# Patient Record
Sex: Male | Born: 1960
Health system: Southern US, Community
[De-identification: ages and names within clinical notes are randomized; demographics above are authoritative.]

## PROBLEM LIST (undated history)

## (undated) DIAGNOSIS — R569 Unspecified convulsions: Secondary | ICD-10-CM

## (undated) DIAGNOSIS — S065XAA Traumatic subdural hemorrhage with loss of consciousness status unknown, initial encounter: Secondary | ICD-10-CM

## (undated) DIAGNOSIS — F32A Depression, unspecified: Secondary | ICD-10-CM

## (undated) DIAGNOSIS — I10 Essential (primary) hypertension: Secondary | ICD-10-CM

## (undated) DIAGNOSIS — K219 Gastro-esophageal reflux disease without esophagitis: Secondary | ICD-10-CM

## (undated) DIAGNOSIS — F1910 Other psychoactive substance abuse, uncomplicated: Secondary | ICD-10-CM

## (undated) HISTORY — PX: NO PAST SURGERIES: SHX2092

---

## 2001-08-16 ENCOUNTER — Other Ambulatory Visit: Admission: RE | Admit: 2001-08-16 | Discharge: 2001-08-16 | Payer: Self-pay | Admitting: Urology

## 2002-09-19 ENCOUNTER — Emergency Department (HOSPITAL_COMMUNITY): Admission: EM | Admit: 2002-09-19 | Discharge: 2002-09-19 | Payer: Self-pay | Admitting: *Deleted

## 2002-09-19 ENCOUNTER — Encounter: Payer: Self-pay | Admitting: *Deleted

## 2003-01-10 ENCOUNTER — Ambulatory Visit (HOSPITAL_COMMUNITY): Admission: RE | Admit: 2003-01-10 | Discharge: 2003-01-10 | Payer: Self-pay | Admitting: Family Medicine

## 2003-01-10 ENCOUNTER — Encounter: Payer: Self-pay | Admitting: Family Medicine

## 2003-09-12 ENCOUNTER — Ambulatory Visit (HOSPITAL_COMMUNITY): Admission: RE | Admit: 2003-09-12 | Discharge: 2003-09-12 | Payer: Self-pay | Admitting: Family Medicine

## 2008-07-18 ENCOUNTER — Ambulatory Visit (HOSPITAL_COMMUNITY): Admission: RE | Admit: 2008-07-18 | Discharge: 2008-07-18 | Payer: Self-pay | Admitting: Family Medicine

## 2014-03-07 ENCOUNTER — Other Ambulatory Visit: Payer: Self-pay | Admitting: Urology

## 2014-03-07 ENCOUNTER — Ambulatory Visit (INDEPENDENT_AMBULATORY_CARE_PROVIDER_SITE_OTHER): Payer: 59 | Admitting: Urology

## 2014-03-07 DIAGNOSIS — R972 Elevated prostate specific antigen [PSA]: Secondary | ICD-10-CM

## 2014-04-04 ENCOUNTER — Other Ambulatory Visit (HOSPITAL_COMMUNITY): Payer: 59

## 2019-07-22 DIAGNOSIS — I10 Essential (primary) hypertension: Secondary | ICD-10-CM | POA: Diagnosis not present

## 2019-07-22 DIAGNOSIS — Z1389 Encounter for screening for other disorder: Secondary | ICD-10-CM | POA: Diagnosis not present

## 2019-07-22 DIAGNOSIS — G4701 Insomnia due to medical condition: Secondary | ICD-10-CM | POA: Diagnosis not present

## 2019-07-22 DIAGNOSIS — Z6823 Body mass index (BMI) 23.0-23.9, adult: Secondary | ICD-10-CM | POA: Diagnosis not present

## 2019-12-06 ENCOUNTER — Encounter (HOSPITAL_COMMUNITY): Payer: Self-pay | Admitting: *Deleted

## 2019-12-06 ENCOUNTER — Emergency Department (HOSPITAL_COMMUNITY)
Admission: EM | Admit: 2019-12-06 | Discharge: 2019-12-06 | Disposition: A | Discharge status: Home / Self Care | Payer: BC Managed Care – PPO | Attending: Emergency Medicine | Admitting: Emergency Medicine

## 2019-12-06 ENCOUNTER — Other Ambulatory Visit: Payer: Self-pay

## 2019-12-06 DIAGNOSIS — B349 Viral infection, unspecified: Secondary | ICD-10-CM | POA: Diagnosis not present

## 2019-12-06 DIAGNOSIS — I10 Essential (primary) hypertension: Secondary | ICD-10-CM | POA: Diagnosis not present

## 2019-12-06 DIAGNOSIS — F17210 Nicotine dependence, cigarettes, uncomplicated: Secondary | ICD-10-CM | POA: Diagnosis not present

## 2019-12-06 DIAGNOSIS — S00502A Unspecified superficial injury of oral cavity, initial encounter: Secondary | ICD-10-CM | POA: Diagnosis not present

## 2019-12-06 DIAGNOSIS — W57XXXA Bitten or stung by nonvenomous insect and other nonvenomous arthropods, initial encounter: Secondary | ICD-10-CM | POA: Insufficient documentation

## 2019-12-06 DIAGNOSIS — Y998 Other external cause status: Secondary | ICD-10-CM | POA: Insufficient documentation

## 2019-12-06 DIAGNOSIS — L2481 Irritant contact dermatitis due to metals: Secondary | ICD-10-CM

## 2019-12-06 DIAGNOSIS — L2489 Irritant contact dermatitis due to other agents: Secondary | ICD-10-CM | POA: Diagnosis not present

## 2019-12-06 DIAGNOSIS — Y9389 Activity, other specified: Secondary | ICD-10-CM | POA: Insufficient documentation

## 2019-12-06 DIAGNOSIS — Y9289 Other specified places as the place of occurrence of the external cause: Secondary | ICD-10-CM | POA: Insufficient documentation

## 2019-12-06 HISTORY — DX: Essential (primary) hypertension: I10

## 2019-12-06 LAB — COMPREHENSIVE METABOLIC PANEL
ALT: 47 U/L — ABNORMAL HIGH (ref 0–44)
AST: 68 U/L — ABNORMAL HIGH (ref 15–41)
Albumin: 4.4 g/dL (ref 3.5–5.0)
Alkaline Phosphatase: 60 U/L (ref 38–126)
Anion gap: 13 (ref 5–15)
BUN: 6 mg/dL (ref 6–20)
CO2: 25 mmol/L (ref 22–32)
Calcium: 8.7 mg/dL — ABNORMAL LOW (ref 8.9–10.3)
Chloride: 90 mmol/L — ABNORMAL LOW (ref 98–111)
Creatinine, Ser: 0.69 mg/dL (ref 0.61–1.24)
GFR calc Af Amer: >60 mL/min (ref >60)
GFR calc non Af Amer: >60 mL/min (ref >60)
Glucose, Bld: 92 mg/dL (ref 70–99)
Potassium: 3.4 mmol/L — ABNORMAL LOW (ref 3.5–5.1)
Sodium: 128 mmol/L — ABNORMAL LOW (ref 135–145)
Total Bilirubin: 0.7 mg/dL (ref 0.3–1.2)
Total Protein: 7.5 g/dL (ref 6.5–8.1)

## 2019-12-06 LAB — CBC WITH DIFFERENTIAL/PLATELET
Abs Immature Granulocytes: 0.01 10*3/uL (ref 0.00–0.07)
Basophils Absolute: 0 10*3/uL (ref 0.0–0.1)
Basophils Relative: 1 %
Eosinophils Absolute: 0.1 10*3/uL (ref 0.0–0.5)
Eosinophils Relative: 3 %
HCT: 39.1 % (ref 39.0–52.0)
Hemoglobin: 13.6 g/dL (ref 13.0–17.0)
Immature Granulocytes: 0 %
Lymphocytes Relative: 33 %
Lymphs Abs: 1.1 10*3/uL (ref 0.7–4.0)
MCH: 34.2 pg — ABNORMAL HIGH (ref 26.0–34.0)
MCHC: 34.8 g/dL (ref 30.0–36.0)
MCV: 98.2 fL (ref 80.0–100.0)
Monocytes Absolute: 0.5 10*3/uL (ref 0.1–1.0)
Monocytes Relative: 14 %
Neutro Abs: 1.7 10*3/uL (ref 1.7–7.7)
Neutrophils Relative %: 49 %
Platelets: 179 10*3/uL (ref 150–400)
RBC: 3.98 MIL/uL — ABNORMAL LOW (ref 4.22–5.81)
RDW: 12.5 % (ref 11.5–15.5)
WBC: 3.4 10*3/uL — ABNORMAL LOW (ref 4.0–10.5)
nRBC: 0 % (ref 0.0–0.2)

## 2019-12-06 LAB — LIPASE, BLOOD: Lipase: 38 U/L (ref 11–51)

## 2019-12-06 MED ORDER — ONDANSETRON 4 MG PO TBDP
4.0000 mg | ORAL_TABLET | Freq: Three times a day (TID) | ORAL | 0 refills | Status: DC | PRN
Start: 2019-12-06 — End: 2021-01-10

## 2019-12-06 MED ORDER — ONDANSETRON 4 MG PO TBDP
4.0000 mg | ORAL_TABLET | Freq: Once | ORAL | Status: AC
  Administered 2019-12-06: 4 mg via ORAL
  Filled 2019-12-06: qty 1

## 2019-12-06 NOTE — ED Provider Notes (Signed)
[facility] EMERGENCY DEPARTMENT Provider Note   CSN: [id number] Arrival date & time: 12/06/19  1653     History Chief Complaint  Patient presents with  . Mouth Injury    ROEL DOUTHAT is a 59 y.o. male presenting for evaluation of generalized malaise and fatigue along with vomiting and diarrhea.  He was bit on his tongue by a small spider yesterday morning and several hours later developed generalized fatigue and generally feeling unwell.  He states he took a sip of a drink with a straw and apparently there was a small spider inside the straw which bit him on the tongue.  He denies current pain or swelling of his tongue but still feels a small knot at the bite site.  He removed the insect from his mouth, describes it as tiny and brown in coloration.  He worked last night but left early secondary to generalized fatigue.  This morning he had 3 episodes of emesis with persistent nausea throughout today.  He also had one episode of diarrhea earlier today but none since.  He denies fevers or chills, no cough, no abdominal pain or cramping.  Also no dysuria, headache, neck pain, no rash development.  He mentions a chronic rash of his mid lower abdomen since purchasing a new belt several months ago.  He had been applying Neosporin but the rash worsened so his PCP prescribed mupirocin but still with no relief at the site.  Patient is fully vaccinated for COVID-19.  The history is provided by the patient.       Past Medical History:  Diagnosis Date  . Hypertension     There are no problems to display for this patient.   History reviewed. No pertinent surgical history.     No family history on file.  Social History   Tobacco Use  . Smoking status: Current Every Day Smoker    Packs/day: 0.50  . Smokeless tobacco: Never Used  Vaping Use  . Vaping Use: Never used  Substance Use Topics  . Alcohol use: Yes  . Drug use: Not Currently    Home Medications Prior to Admission medications    Medication Sig Start Date End Date Taking? Authorizing Provider  ondansetron (ZOFRAN ODT) 4 MG disintegrating tablet Take 1 tablet (4 mg total) by mouth every 8 (eight) hours as needed for nausea or vomiting. 12/06/19   Burgess Amor, PA-C    Allergies    Codeine  Review of Systems   Review of Systems  Constitutional: Positive for fatigue. Negative for chills, diaphoresis and fever.  HENT: Negative for congestion, facial swelling, rhinorrhea, sore throat, trouble swallowing and voice change.   Eyes: Negative.   Respiratory: Negative for chest tightness and shortness of breath.   Cardiovascular: Negative for chest pain.  Gastrointestinal: Positive for diarrhea, nausea and vomiting. Negative for abdominal pain.  Genitourinary: Negative.   Musculoskeletal: Negative for arthralgias, joint swelling and neck pain.  Skin: Negative.  Negative for rash and wound.       Localized abdominal rash per HPI  Neurological: Negative for dizziness, weakness, light-headedness, numbness and headaches.  Psychiatric/Behavioral: Negative.     Physical Exam Updated Vital Signs BP (!) 127/93   Pulse 98   Temp 98.3 F (36.8 C)   Resp 20   SpO2 97%   Physical Exam Vitals and nursing note reviewed.  Constitutional:      Appearance: He is well-developed.  HENT:     Head: Normocephalic and atraumatic.  Nose:     Comments: No obvious tongue lesion or bite identified.    Mouth/Throat:     Mouth: Mucous membranes are moist.     Pharynx: No posterior oropharyngeal erythema.  Eyes:     Conjunctiva/sclera: Conjunctivae normal.  Cardiovascular:     Rate and Rhythm: Normal rate and regular rhythm.     Pulses: Normal pulses.     Heart sounds: Normal heart sounds.  Pulmonary:     Effort: Pulmonary effort is normal.     Breath sounds: Normal breath sounds. No wheezing.  Abdominal:     General: Bowel sounds are normal.     Palpations: Abdomen is soft.     Tenderness: There is no abdominal tenderness.    Musculoskeletal:        General: Normal range of motion.     Cervical back: Normal range of motion.  Skin:    General: Skin is warm and dry.     Comments: Localized erythematous macular rash localized to anterior midline waistline.  Neurological:     Mental Status: He is alert.     ED Results / Procedures / Treatments   Labs (all labs ordered are listed, but only abnormal results are displayed) Labs Reviewed  COMPREHENSIVE METABOLIC PANEL - Abnormal; Notable for the following components:      Result Value   Sodium 128 (*)    Potassium 3.4 (*)    Chloride 90 (*)    Calcium 8.7 (*)    AST 68 (*)    ALT 47 (*)    All other components within normal limits  CBC WITH DIFFERENTIAL/PLATELET - Abnormal; Notable for the following components:   WBC 3.4 (*)    RBC 3.98 (*)    MCH 34.2 (*)    All other components within normal limits  LIPASE, BLOOD    EKG None  Radiology No results found.  Procedures Procedures (including critical care time)  Medications Ordered in ED Medications  ondansetron (ZOFRAN-ODT) disintegrating tablet 4 mg (4 mg Oral Given 12/06/19 1829)    ED Course  I have reviewed the triage vital signs and the nursing notes.  Pertinent labs & imaging results that were available during my care of the patient were reviewed by me and considered in my medical decision making (see chart for details).    MDM Rules/Calculators/A&P                          Patient with suspected viral syndrome.  He does not have symptoms that would correspond with a brown recluse spider bite.  I suspect this is viral and should run its course.  He was prescribed Zofran for nausea relief.  He was advised rest, Tylenol or Motrin for body aches.  We also discussed the rash on his abdomen, I suspect this is a contact dermatitis from his new belt buckle.  I suggested he try hydrocortisone cream in place of the mupirocin he has been using.  As needed follow-up if symptoms persist or  worsen. Final Clinical Impression(s) / ED Diagnoses Final diagnoses:  Viral syndrome  Irritant contact dermatitis due to metals    Rx / DC Orders ED Discharge Orders         Ordered    ondansetron (ZOFRAN ODT) 4 MG disintegrating tablet  Every 8 hours PRN     Discontinue  Reprint     12/06/19 1933  Burgess Amor, PA-C 12/06/19 1934    Derwood Kaplan, MD 12/06/19 [phone]

## 2019-12-06 NOTE — ED Triage Notes (Signed)
States a spider bit his tongue yesterday

## 2019-12-06 NOTE — Discharge Instructions (Signed)
Your lab work today is reassuring with no red flag findings suggesting your symptoms are related to this spider bite.  I suspect you have a virus that should resolve with time and rest.  You may continue using the Zofran as prescribed if needed for continued nausea.  Make sure you are drinking plenty of fluids.  Get rechecked if your symptoms are not improving over the next 48 hours.  I suggest a trial of hydrocortisone cream (no prescription needed) for your abdominal rash.  I suspect that this is an allergic reaction to your belt buckle.

## 2019-12-06 NOTE — ED Triage Notes (Signed)
States he feels tired and had to leave work.

## 2020-01-30 ENCOUNTER — Other Ambulatory Visit: Payer: BC Managed Care – PPO

## 2020-12-11 ENCOUNTER — Encounter (HOSPITAL_COMMUNITY): Payer: Self-pay | Admitting: Emergency Medicine

## 2020-12-11 ENCOUNTER — Emergency Department (HOSPITAL_COMMUNITY)
Admission: EM | Admit: 2020-12-11 | Discharge: 2020-12-11 | Disposition: A | Discharge status: Home / Self Care | Payer: 59 | Attending: Emergency Medicine | Admitting: Emergency Medicine

## 2020-12-11 ENCOUNTER — Other Ambulatory Visit: Payer: Self-pay

## 2020-12-11 ENCOUNTER — Emergency Department (HOSPITAL_COMMUNITY): Payer: 59

## 2020-12-11 DIAGNOSIS — Z20822 Contact with and (suspected) exposure to covid-19: Secondary | ICD-10-CM | POA: Insufficient documentation

## 2020-12-11 DIAGNOSIS — E871 Hypo-osmolality and hyponatremia: Secondary | ICD-10-CM | POA: Insufficient documentation

## 2020-12-11 DIAGNOSIS — R630 Anorexia: Secondary | ICD-10-CM | POA: Diagnosis not present

## 2020-12-11 DIAGNOSIS — Z79899 Other long term (current) drug therapy: Secondary | ICD-10-CM | POA: Diagnosis not present

## 2020-12-11 DIAGNOSIS — I10 Essential (primary) hypertension: Secondary | ICD-10-CM | POA: Diagnosis not present

## 2020-12-11 DIAGNOSIS — F17210 Nicotine dependence, cigarettes, uncomplicated: Secondary | ICD-10-CM | POA: Insufficient documentation

## 2020-12-11 DIAGNOSIS — B349 Viral infection, unspecified: Secondary | ICD-10-CM | POA: Diagnosis not present

## 2020-12-11 DIAGNOSIS — R197 Diarrhea, unspecified: Secondary | ICD-10-CM | POA: Diagnosis present

## 2020-12-11 LAB — URINALYSIS, ROUTINE W REFLEX MICROSCOPIC
Bilirubin Urine: NEGATIVE
Glucose, UA: NEGATIVE mg/dL
Hgb urine dipstick: NEGATIVE
Ketones, ur: NEGATIVE mg/dL
Leukocytes,Ua: NEGATIVE
Nitrite: NEGATIVE
Protein, ur: NEGATIVE mg/dL
Specific Gravity, Urine: 1.001 — ABNORMAL LOW (ref 1.005–1.030)
pH: 6 (ref 5.0–8.0)

## 2020-12-11 LAB — CBC WITH DIFFERENTIAL/PLATELET
Abs Immature Granulocytes: 0.05 10*3/uL (ref 0.00–0.07)
Basophils Absolute: 0 10*3/uL (ref 0.0–0.1)
Basophils Relative: 0 %
Eosinophils Absolute: 0 10*3/uL (ref 0.0–0.5)
Eosinophils Relative: 0 %
HCT: 44.5 % (ref 39.0–52.0)
Hemoglobin: 15.7 g/dL (ref 13.0–17.0)
Immature Granulocytes: 1 %
Lymphocytes Relative: 13 %
Lymphs Abs: 1 10*3/uL (ref 0.7–4.0)
MCH: 32.7 pg (ref 26.0–34.0)
MCHC: 35.3 g/dL (ref 30.0–36.0)
MCV: 92.7 fL (ref 80.0–100.0)
Monocytes Absolute: 0.9 10*3/uL (ref 0.1–1.0)
Monocytes Relative: 11 %
Neutro Abs: 5.9 10*3/uL (ref 1.7–7.7)
Neutrophils Relative %: 75 %
Platelets: 253 10*3/uL (ref 150–400)
RBC: 4.8 MIL/uL (ref 4.22–5.81)
RDW: 12.5 % (ref 11.5–15.5)
WBC: 7.8 10*3/uL (ref 4.0–10.5)
nRBC: 0 % (ref 0.0–0.2)

## 2020-12-11 LAB — COMPREHENSIVE METABOLIC PANEL
ALT: 49 U/L — ABNORMAL HIGH (ref 0–44)
AST: 73 U/L — ABNORMAL HIGH (ref 15–41)
Albumin: 4.7 g/dL (ref 3.5–5.0)
Alkaline Phosphatase: 56 U/L (ref 38–126)
Anion gap: 15 (ref 5–15)
BUN: 9 mg/dL (ref 6–20)
CO2: 28 mmol/L (ref 22–32)
Calcium: 9.6 mg/dL (ref 8.9–10.3)
Chloride: 85 mmol/L — ABNORMAL LOW (ref 98–111)
Creatinine, Ser: 1.03 mg/dL (ref 0.61–1.24)
GFR, Estimated: >60 mL/min (ref >60)
Glucose, Bld: 114 mg/dL — ABNORMAL HIGH (ref 70–99)
Potassium: 3 mmol/L — ABNORMAL LOW (ref 3.5–5.1)
Sodium: 128 mmol/L — ABNORMAL LOW (ref 135–145)
Total Bilirubin: 1.1 mg/dL (ref 0.3–1.2)
Total Protein: 7.7 g/dL (ref 6.5–8.1)

## 2020-12-11 LAB — CK: Total CK: 332 U/L (ref 49–397)

## 2020-12-11 LAB — RESP PANEL BY RT-PCR (FLU A&B, COVID) ARPGX2
Influenza A by PCR: NEGATIVE
Influenza B by PCR: NEGATIVE
SARS Coronavirus 2 by RT PCR: NEGATIVE

## 2020-12-11 MED ORDER — SODIUM CHLORIDE 0.9 % IV SOLN: INTRAVENOUS | Status: DC

## 2020-12-11 MED ORDER — SODIUM CHLORIDE 0.9 % IV BOLUS
500.0000 mL | Freq: Once | INTRAVENOUS | Status: AC
  Administered 2020-12-11: 500 mL via INTRAVENOUS

## 2020-12-11 NOTE — Discharge Instructions (Addendum)
COVID testing influenza testing negative.  Sodium was low today and it was low a year ago.  This could be due to your blood pressure medicines I understand you do not know exactly what you are on but what we do have listed is amlodipine BenzePrO well that possibly could be the cause.  Follow back up with your primary care doctor.  Work note provided to be out of work.

## 2020-12-11 NOTE — ED Provider Notes (Signed)
[facility] EMERGENCY DEPARTMENT Provider Note   CSN: [id number] Arrival date & time: 12/11/20  1120     History Chief Complaint  Patient presents with   Heat Exposure    Eric Conley is a 60 y.o. male.  Patient with questionable heat exposure at work.  But patient sent in for work also to rule out COVID.  Patient's had some diarrhea and leg Fatigue Some Nausea but No Vomiting No Real Abdominal Pain.  Temperature Here 98.4 Heart Rate 122.  Patient Has a History of Hypertension.  Patient's heart rate came down on its own.  Now 88.  Denies any fevers.  But he has had fatigue and decreased appetite.  Patient states he has been drinking sports drinks and water at work and has done that for a long time.  Chart review shows that year ago he had a sodium of 128.  Patient has been vaccinated against COVID and had the booster.  Patient without any real respiratory symptoms.  The diarrhea has resolved.  No abdominal pain.      Past Medical History:  Diagnosis Date   Hypertension     There are no problems to display for this patient.   History reviewed. No pertinent surgical history.     No family history on file.  Social History   Tobacco Use   Smoking status: Every Day    Packs/day: 0.50    Pack years: 0.00    Types: Cigarettes   Smokeless tobacco: Never  Vaping Use   Vaping Use: Never used  Substance Use Topics   Alcohol use: Yes   Drug use: Not Currently    Home Medications Prior to Admission medications   Medication Sig Start Date End Date Taking? Authorizing Provider  ALPRAZolam Prudy Feeler) 1 MG tablet Take 1 mg by mouth 3 (three) times daily. 11/13/20  Yes [provider]  amLODipine-benazepril (LOTREL) 10-20 MG capsule Take 1 capsule by mouth daily. 11/13/20  Yes [provider]  betamethasone dipropionate 0.05 % cream Apply 1 application topically 2 (two) times daily. 10/23/20  Yes [provider]  mupirocin ointment (BACTROBAN) 2 % Apply 1  application topically 3 (three) times daily. 10/23/20  Yes [provider]  ondansetron (ZOFRAN ODT) 4 MG disintegrating tablet Take 1 tablet (4 mg total) by mouth every 8 (eight) hours as needed for nausea or vomiting. 12/06/19   Burgess Amor, PA-C    Allergies    Codeine  Review of Systems   Review of Systems  Constitutional:  Positive for fatigue. Negative for chills and fever.  HENT:  Negative for ear pain and sore throat.   Eyes:  Negative for pain and visual disturbance.  Respiratory:  Negative for cough and shortness of breath.   Cardiovascular:  Negative for chest pain and palpitations.  Gastrointestinal:  Positive for diarrhea and nausea. Negative for abdominal pain and vomiting.  Genitourinary:  Negative for dysuria and hematuria.  Musculoskeletal:  Negative for arthralgias and back pain.  Skin:  Negative for color change and rash.  Neurological:  Positive for weakness. Negative for seizures and syncope.  All other systems reviewed and are negative.  Physical Exam Updated Vital Signs BP 97/82   Pulse 80   Temp 98.4 F (36.9 C) (Oral)   Resp 18   Ht 1.778 m (5\' 10" )   Wt 68 kg   SpO2 98%   BMI 21.52 kg/m   Physical Exam Vitals and nursing note reviewed.  Constitutional:  Appearance: Normal appearance. He is well-developed.  HENT:     Head: Normocephalic and atraumatic.     Mouth/Throat:     Mouth: Mucous membranes are moist.  Eyes:     Extraocular Movements: Extraocular movements intact.     Conjunctiva/sclera: Conjunctivae normal.     Pupils: Pupils are equal, round, and reactive to light.  Cardiovascular:     Rate and Rhythm: Normal rate and regular rhythm.     Heart sounds: No murmur heard. Pulmonary:     Effort: Pulmonary effort is normal. No respiratory distress.     Breath sounds: Normal breath sounds. No wheezing.  Abdominal:     General: There is no distension.     Palpations: Abdomen is soft.     Tenderness: There is no abdominal  tenderness. There is no guarding.  Musculoskeletal:        General: No swelling. Normal range of motion.     Cervical back: Normal range of motion and neck supple.  Skin:    General: Skin is warm and dry.  Neurological:     General: No focal deficit present.     Mental Status: He is alert and oriented to person, place, and time.     Cranial Nerves: No cranial nerve deficit.     Sensory: No sensory deficit.     Motor: No weakness.    ED Results / Procedures / Treatments   Labs (all labs ordered are listed, but only abnormal results are displayed) Labs Reviewed  COMPREHENSIVE METABOLIC PANEL - Abnormal; Notable for the following components:      Result Value   Sodium 128 (*)    Potassium 3.0 (*)    Chloride 85 (*)    Glucose, Bld 114 (*)    AST 73 (*)    ALT 49 (*)    All other components within normal limits  URINALYSIS, ROUTINE W REFLEX MICROSCOPIC - Abnormal; Notable for the following components:   Color, Urine STRAW (*)    Specific Gravity, Urine 1.001 (*)    All other components within normal limits  RESP PANEL BY RT-PCR (FLU A&B, COVID) ARPGX2  CBC WITH DIFFERENTIAL/PLATELET  CK    EKG None  Radiology DG Chest Port 1 View  Result Date: 12/11/2020 CLINICAL DATA:  Fatigue EXAM: PORTABLE CHEST 1 VIEW COMPARISON:  None. FINDINGS: The heart size and mediastinal contours are within normal limits. Mildly coarsened bibasilar interstitial markings. No focal airspace consolidation, pleural effusion, or pneumothorax. The visualized skeletal structures are unremarkable. IMPRESSION: Mildly coarsened bibasilar interstitial markings, which may reflect bronchitic type lung changes. Electronically Signed   By: Duanne Guess D.O.   On: 12/11/2020 13:20    Procedures Procedures   Medications Ordered in ED Medications  0.9 %  sodium chloride infusion ( Intravenous New Bag/Given 12/11/20 1255)  sodium chloride 0.9 % bolus 500 mL (500 mLs Intravenous New Bag/Given 12/11/20 1430)     ED Course  I have reviewed the triage vital signs and the nursing notes.  Pertinent labs & imaging results that were available during my care of the patient were reviewed by me and considered in my medical decision making (see chart for details).    MDM Rules/Calculators/A&P                          Patient CK slightly elevated and then 300 range.  But not significant for rhabdomyolysis.  Patient's urinalysis is negative.  COVID testing influenza testing  is negative.  CBC no leukocytosis hemoglobin 15.7.  Electrolytes significant for sodium of 128.  Renal functions normal.  Slight elevation in AST ALT.  Chest x-ray negative.  Patient was given some normal saline here 500 cc bolus.  Patient is on blood pressure medicine.  That may be responsible for the low sodiums.  We will have him follow back up with primary care doctor.  We will give him a work note to be out of work throughout the weekend.  No acute findings.  Patient will return for any new or worse symptoms   Final Clinical Impression(s) / ED Diagnoses Final diagnoses:  Viral illness  Hyponatremia    Rx / DC Orders ED Discharge Orders     None        Vanetta Mulders, MD 12/11/20 1459

## 2020-12-11 NOTE — ED Triage Notes (Signed)
Pt c/o diarrhea, leg cramps, and fatigue. 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

## 2021-01-10 ENCOUNTER — Encounter (HOSPITAL_COMMUNITY): Payer: Self-pay

## 2021-01-10 ENCOUNTER — Emergency Department (HOSPITAL_COMMUNITY)
Admission: EM | Admit: 2021-01-10 | Discharge: 2021-01-12 | Disposition: A | Discharge status: Home / Self Care | Payer: 59 | Attending: Emergency Medicine | Admitting: Emergency Medicine

## 2021-01-10 ENCOUNTER — Other Ambulatory Visit: Payer: Self-pay

## 2021-01-10 DIAGNOSIS — I10 Essential (primary) hypertension: Secondary | ICD-10-CM | POA: Diagnosis not present

## 2021-01-10 DIAGNOSIS — F1020 Alcohol dependence, uncomplicated: Secondary | ICD-10-CM | POA: Diagnosis not present

## 2021-01-10 DIAGNOSIS — R45851 Suicidal ideations: Secondary | ICD-10-CM

## 2021-01-10 DIAGNOSIS — F17210 Nicotine dependence, cigarettes, uncomplicated: Secondary | ICD-10-CM | POA: Insufficient documentation

## 2021-01-10 DIAGNOSIS — Z79899 Other long term (current) drug therapy: Secondary | ICD-10-CM | POA: Diagnosis not present

## 2021-01-10 DIAGNOSIS — Z20822 Contact with and (suspected) exposure to covid-19: Secondary | ICD-10-CM | POA: Diagnosis not present

## 2021-01-10 DIAGNOSIS — F332 Major depressive disorder, recurrent severe without psychotic features: Secondary | ICD-10-CM | POA: Diagnosis not present

## 2021-01-10 DIAGNOSIS — F329 Major depressive disorder, single episode, unspecified: Secondary | ICD-10-CM | POA: Diagnosis present

## 2021-01-10 DIAGNOSIS — Y906 Blood alcohol level of 120-199 mg/100 ml: Secondary | ICD-10-CM | POA: Insufficient documentation

## 2021-01-10 LAB — CBC WITH DIFFERENTIAL/PLATELET
Abs Immature Granulocytes: 0.02 10*3/uL (ref 0.00–0.07)
Basophils Absolute: 0 10*3/uL (ref 0.0–0.1)
Basophils Relative: 1 %
Eosinophils Absolute: 0.1 10*3/uL (ref 0.0–0.5)
Eosinophils Relative: 4 %
HCT: 40.7 % (ref 39.0–52.0)
Hemoglobin: 14 g/dL (ref 13.0–17.0)
Immature Granulocytes: 1 %
Lymphocytes Relative: 31 %
Lymphs Abs: 1 10*3/uL (ref 0.7–4.0)
MCH: 33.3 pg (ref 26.0–34.0)
MCHC: 34.4 g/dL (ref 30.0–36.0)
MCV: 96.7 fL (ref 80.0–100.0)
Monocytes Absolute: 0.4 10*3/uL (ref 0.1–1.0)
Monocytes Relative: 14 %
Neutro Abs: 1.6 10*3/uL — ABNORMAL LOW (ref 1.7–7.7)
Neutrophils Relative %: 49 %
Platelets: 228 10*3/uL (ref 150–400)
RBC: 4.21 MIL/uL — ABNORMAL LOW (ref 4.22–5.81)
RDW: 13.2 % (ref 11.5–15.5)
WBC: 3.3 10*3/uL — ABNORMAL LOW (ref 4.0–10.5)
nRBC: 0 % (ref 0.0–0.2)

## 2021-01-10 LAB — RAPID URINE DRUG SCREEN, HOSP PERFORMED
Amphetamines: NOT DETECTED
Barbiturates: NOT DETECTED
Benzodiazepines: POSITIVE — AB
Cocaine: NOT DETECTED
Opiates: NOT DETECTED
Tetrahydrocannabinol: NOT DETECTED

## 2021-01-10 LAB — COMPREHENSIVE METABOLIC PANEL
ALT: 25 U/L (ref 0–44)
AST: 46 U/L — ABNORMAL HIGH (ref 15–41)
Albumin: 4.3 g/dL (ref 3.5–5.0)
Alkaline Phosphatase: 51 U/L (ref 38–126)
Anion gap: 10 (ref 5–15)
BUN: 8 mg/dL (ref 6–20)
CO2: 23 mmol/L (ref 22–32)
Calcium: 8.4 mg/dL — ABNORMAL LOW (ref 8.9–10.3)
Chloride: 102 mmol/L (ref 98–111)
Creatinine, Ser: 0.64 mg/dL (ref 0.61–1.24)
GFR, Estimated: >60 mL/min (ref >60)
Glucose, Bld: 104 mg/dL — ABNORMAL HIGH (ref 70–99)
Potassium: 3.4 mmol/L — ABNORMAL LOW (ref 3.5–5.1)
Sodium: 135 mmol/L (ref 135–145)
Total Bilirubin: 0.4 mg/dL (ref 0.3–1.2)
Total Protein: 7.5 g/dL (ref 6.5–8.1)

## 2021-01-10 LAB — ETHANOL: Alcohol, Ethyl (B): 167 mg/dL — ABNORMAL HIGH (ref <10)

## 2021-01-10 MED ORDER — IBUPROFEN 400 MG PO TABS: 600.0000 mg | ORAL_TABLET | Freq: Three times a day (TID) | ORAL | Status: DC | PRN

## 2021-01-10 MED ORDER — LORAZEPAM 1 MG PO TABS: 0.0000 mg | ORAL_TABLET | Freq: Two times a day (BID) | ORAL | Status: DC

## 2021-01-10 MED ORDER — THIAMINE HCL 100 MG/ML IJ SOLN
100.0000 mg | Freq: Every day | INTRAMUSCULAR | Status: DC
  Filled 2021-01-10: qty 2

## 2021-01-10 MED ORDER — AMLODIPINE BESYLATE 5 MG PO TABS
10.0000 mg | ORAL_TABLET | Freq: Every day | ORAL | Status: DC
  Administered 2021-01-10 – 2021-01-12 (×3): 10 mg via ORAL
  Filled 2021-01-10 (×3): qty 2

## 2021-01-10 MED ORDER — LORAZEPAM 2 MG/ML IJ SOLN: 0.0000 mg | Freq: Four times a day (QID) | INTRAMUSCULAR | Status: AC

## 2021-01-10 MED ORDER — ALPRAZOLAM 0.5 MG PO TABS: 1.0000 mg | ORAL_TABLET | Freq: Three times a day (TID) | ORAL | Status: DC

## 2021-01-10 MED ORDER — ONDANSETRON HCL 4 MG PO TABS: 4.0000 mg | ORAL_TABLET | Freq: Three times a day (TID) | ORAL | Status: DC | PRN

## 2021-01-10 MED ORDER — ALUM & MAG HYDROXIDE-SIMETH 200-200-20 MG/5ML PO SUSP
30.0000 mL | Freq: Four times a day (QID) | ORAL | Status: DC | PRN
  Administered 2021-01-11: 30 mL via ORAL
  Filled 2021-01-10 (×2): qty 30

## 2021-01-10 MED ORDER — LORAZEPAM 2 MG/ML IJ SOLN: 0.0000 mg | Freq: Two times a day (BID) | INTRAMUSCULAR | Status: DC

## 2021-01-10 MED ORDER — LORAZEPAM 1 MG PO TABS
0.0000 mg | ORAL_TABLET | Freq: Four times a day (QID) | ORAL | Status: AC
  Administered 2021-01-10: 1 mg via ORAL
  Administered 2021-01-11: 2 mg via ORAL
  Administered 2021-01-11 – 2021-01-12 (×3): 1 mg via ORAL
  Filled 2021-01-10 (×3): qty 1
  Filled 2021-01-10: qty 2
  Filled 2021-01-10: qty 1

## 2021-01-10 MED ORDER — THIAMINE HCL 100 MG PO TABS
100.0000 mg | ORAL_TABLET | Freq: Every day | ORAL | Status: DC
  Administered 2021-01-11 – 2021-01-12 (×2): 100 mg via ORAL
  Filled 2021-01-10 (×2): qty 1

## 2021-01-10 MED ORDER — HYDROXYZINE HCL 25 MG PO TABS
25.0000 mg | ORAL_TABLET | Freq: Once | ORAL | Status: AC
  Administered 2021-01-10: 25 mg via ORAL
  Filled 2021-01-10: qty 1

## 2021-01-10 MED ORDER — NICOTINE 21 MG/24HR TD PT24
21.0000 mg | MEDICATED_PATCH | Freq: Every day | TRANSDERMAL | Status: DC
  Administered 2021-01-10: 21 mg via TRANSDERMAL
  Filled 2021-01-10 (×2): qty 1

## 2021-01-10 MED ORDER — BENAZEPRIL HCL 10 MG PO TABS
20.0000 mg | ORAL_TABLET | Freq: Every day | ORAL | Status: DC
  Administered 2021-01-10 – 2021-01-12 (×3): 20 mg via ORAL
  Filled 2021-01-10 (×4): qty 2

## 2021-01-10 NOTE — ED Notes (Signed)
Dr Hyacinth Meeker with pt in family room

## 2021-01-10 NOTE — ED Provider Notes (Signed)
[facility] EMERGENCY DEPARTMENT Provider Note   CSN: [id number] Arrival date & time: 01/10/21  3825     History Chief Complaint  Patient presents with   V70.1    Eric Conley is a 60 y.o. male.  Per wife - states that he has been depressed for a while - lost his job when [employer] hit - since then has been "not right" - between jobs - he "doesn't want to work" - lost mother to Covid 06/12/19, father very sick with Covid.  Told her he doesn't want to live - he went to work this morning - states that he doesn't want to live anymore.  He is drinking "too much" - spouse can't tell me how much - but estimates 12 pack / day - mostly beer.  Pt tells me "I have nothing to live for", "I have served my purpose", "I have no reason to wake up" - states that he wants to take some sleeping pills and now wake up - "I have nothing to offer".   He is here on his own will - but states he "wants to die"  Takes Xanax at night.  And BP meds.  Had tried antidepressant in past but "made me shaky".        Past Medical History:  Diagnosis Date   Hypertension     There are no problems to display for this patient.   History reviewed. No pertinent surgical history.     No family history on file.  Social History   Tobacco Use   Smoking status: Every Day    Packs/day: 0.50    Types: Cigarettes   Smokeless tobacco: Never  Vaping Use   Vaping Use: Never used  Substance Use Topics   Alcohol use: Yes   Drug use: Not Currently    Home Medications Prior to Admission medications   Medication Sig Start Date End Date Taking? Authorizing Provider  ALPRAZolam Prudy Feeler) 1 MG tablet Take 1 mg by mouth 3 (three) times daily. 11/13/20   [provider]  amLODipine-benazepril (LOTREL) 10-20 MG capsule Take 1 capsule by mouth daily. 11/13/20   [provider]  betamethasone dipropionate 0.05 % cream Apply 1 application topically 2 (two) times daily. 10/23/20   [provider]  mupirocin  ointment (BACTROBAN) 2 % Apply 1 application topically 3 (three) times daily. 10/23/20   [provider]  ondansetron (ZOFRAN ODT) 4 MG disintegrating tablet Take 1 tablet (4 mg total) by mouth every 8 (eight) hours as needed for nausea or vomiting. 12/06/19   Burgess Amor, PA-C    Allergies    Codeine  Review of Systems   Review of Systems  All other systems reviewed and are negative.  Physical Exam Updated Vital Signs BP (!) 135/91 (BP Location: Right Arm)   Pulse 96   Temp 98 F (36.7 C) (Oral)   Resp 18   Ht 1.778 m (5\' 10" )   Wt 68 kg   SpO2 98%   BMI 21.52 kg/m   Physical Exam Vitals and nursing note reviewed.  Constitutional:      General: He is not in acute distress.    Appearance: He is well-developed.  HENT:     Head: Normocephalic and atraumatic.     Mouth/Throat:     Pharynx: No oropharyngeal exudate.  Eyes:     General: No scleral icterus.       Right eye: No discharge.        Left eye:  No discharge.     Conjunctiva/sclera: Conjunctivae normal.     Pupils: Pupils are equal, round, and reactive to light.  Neck:     Thyroid: No thyromegaly.     Vascular: No JVD.  Cardiovascular:     Rate and Rhythm: Normal rate and regular rhythm.     Heart sounds: Normal heart sounds. No murmur heard.   No friction rub. No gallop.  Pulmonary:     Effort: Pulmonary effort is normal. No respiratory distress.     Breath sounds: Normal breath sounds. No wheezing or rales.  Abdominal:     General: Bowel sounds are normal. There is no distension.     Palpations: Abdomen is soft. There is no mass.     Tenderness: There is no abdominal tenderness.  Musculoskeletal:        General: No tenderness. Normal range of motion.     Cervical back: Normal range of motion and neck supple.  Lymphadenopathy:     Cervical: No cervical adenopathy.  Skin:    General: Skin is warm and dry.     Findings: No erythema or rash.  Neurological:     Mental Status: He is alert.      Coordination: Coordination normal.  Psychiatric:     Comments: Tearful - depressed mood - no insight - poor judgement.  Endorses passive suicidality    ED Results / Procedures / Treatments   Labs (all labs ordered are listed, but only abnormal results are displayed) Labs Reviewed - No data to display  EKG None  Radiology No results found.  Procedures Procedures   Medications Ordered in ED Medications - No data to display  ED Course  I have reviewed the triage vital signs and the nursing notes.  Pertinent labs & imaging results that were available during my care of the patient were reviewed by me and considered in my medical decision making (see chart for details).    MDM Rules/Calculators/A&P                           The patient is under involuntary commitment, I have finished the papers at 9:00 AM, he is definitely a danger to himself, he has poor insight and judgment, he is decompensated with regards to his depression and will need to be stabilized.  Laboratory work-up ordered for medical clearance, TTS will be consulted.  At this time the patient is medically cleared for psychiatric evaluation  Final Clinical Impression(s) / ED Diagnoses Final diagnoses:  Severe episode of recurrent major depressive disorder, without psychotic features (HCC)  Suicidal thoughts    Rx / DC Orders ED Discharge Orders     None        Eber Hong, MD 01/10/21 [phone]

## 2021-01-10 NOTE — ED Notes (Signed)
Urine sample requested. Pt states he can not go at this time. Pt given water.

## 2021-01-10 NOTE — ED Notes (Signed)
IVC paperwork faxed to [facility] @ [phone] or [phone]

## 2021-01-10 NOTE — ED Notes (Signed)
pts family given update

## 2021-01-10 NOTE — ED Notes (Signed)
"  I've thought about how I'd do it and what time of year I would do it but my dad is old and I thought about how it would impact him. I am just sick of living."

## 2021-01-10 NOTE — ED Notes (Signed)
Pt wanded by security prior to changing into psych scrubs.  °

## 2021-01-10 NOTE — ED Notes (Signed)
Per [facility] pt is to observed overnight

## 2021-01-10 NOTE — BH Assessment (Signed)
Comprehensive Clinical Assessment (CCA) Note  01/10/2021 Eric Conley [id number]  Disposition: Eric Conley, PMHNP recommends patient be observed overnight for safety and stabilization. Patient to be re-evaluated when sober. 1:1 sitter recommended for suicide safety precautions. [facility] notified.  The patient demonstrates the following risk factors for suicide: Chronic risk factors for suicide include: psychiatric disorder of depression, substance use disorder, and demographic factors (male, 64>60 y/o). Acute risk factors for suicide include: family or marital conflict and loss (financial, interpersonal, professional). Protective factors for this patient include:  NA . Considering these factors, the overall suicide risk at this point appears to be high. Patient is not appropriate for outpatient follow up.   Flowsheet Row ED [facility]  C-SSRS RISK CATEGORY High Risk No Risk       Patient is a 60 year old male presenting voluntarily to [facility] reporting suicidal ideation. He has since been placed under IVC by EDP due to concerns for dangerousness to self. Patient states, "I just don't want to be here anymore. I want to go to sleep and never wake up. I've served my time here and I don't have any purpose anymore." Patient cites numerous stressors. He states he lost his mother to Covid and that it "crippled" his father, whom he he helps care for. He describes a strained relationship with his wife and dislikes his job at a [employer]. He denies any other supports. He states he has one "so-called" brother whom he does not have a relationship with. He endorses current suicidal ideation, though does not voice a plan. He denies HI/AVH. He reports drinking 1-6 alcoholic beverages on a daily basis. Patient's BAL was 167 upon arrival. He denies any issues with his substance use but seems to be minimizing. He denies any other drug  use. Patient states he does not own firearms but has access to them if he wanted them.   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 Self  What Is the Reason for Your Visit/Call Today? suicidal ideation  How Long Has This Been Causing You Problems? > than 6 months  What Do You Feel Would Help You the Most Today? Treatment for Depression or other mood problem   Have You Recently Had Any Thoughts About Hurting Yourself? Yes  Are You Planning to Commit Suicide/Harm Yourself At This time? No   Have you Recently Had Thoughts About Hurting Someone Eric Conley? No  Are You Planning to Harm Someone at This Time? No  Explanation: No data recorded  Have You Used Any Alcohol or Drugs in the Past 24 Hours? No  How Long Ago Did You Use Drugs or Alcohol? No data recorded What Did You Use and How Much? No data recorded  Do You Currently Have a Therapist/Psychiatrist? No  Name of Therapist/Psychiatrist: No data recorded  Have You Been Recently Discharged From Any Office Practice or Programs? No  Explanation of Discharge From Practice/Program: No data recorded    CCA Screening Triage Referral Assessment Type of Contact: Tele-Assessment  Telemedicine Service Delivery: Telemedicine service delivery: This service was provided via telemedicine using a 2-way, interactive audio and video technology  Is this Initial or Reassessment? Initial Assessment  Date Telepsych consult ordered in [facility]:  01/10/21  Time Telepsych consult ordered in [facility]:  1101  Location of Assessment: [facility]  Provider Location: [facility] Assessment Services   Collateral Involvement: NA   Does Patient Have a Automotive engineer Guardian? No data recorded Name and Contact of Legal Guardian: No data  recorded If Minor and Not Living with Parent(s), Who has Custody? No data recorded Is CPS involved or ever been involved? Never  Is APS involved or ever been involved? Never   Patient Determined To Be At Risk for Harm To Self or Others Based on Review of Patient Reported Information or Presenting Complaint? Yes, for Self-Harm  Method: No data recorded Availability of Means: No data recorded Intent: No data recorded Notification Required: No data recorded Additional Information for Danger to Others Potential: No data recorded Additional Comments for Danger to Others Potential: No data recorded Are There Guns or Other Weapons in Your Home? No data recorded Types of Guns/Weapons: No data recorded Are These Weapons Safely Secured?                            No data recorded Who Could Verify You Are Able To Have These Secured: No data recorded Do You Have any Outstanding Charges, Pending Court Dates, Parole/Probation? No data recorded Contacted To Inform of Risk of Harm To Self or Others: No data recorded   Does Patient Present under Involuntary Commitment? Yes  IVC Papers Initial File Date: 01/10/21   [place] of Residence: [place]   Patient Currently Receiving the Following Services: Not Receiving Services   Determination of Need: Emergent (2 hours)   Options For Referral: Intensive Outpatient Therapy; Inpatient Hospitalization; Medication Management; Outpatient Therapy     CCA Biopsychosocial Patient Reported Schizophrenia/Schizoaffective Diagnosis in Past: No   Strengths: intelligent, emplyoed, has housing   Mental Health Symptoms Depression:   Change in energy/activity; Difficulty Concentrating; Fatigue; Hopelessness; Increase/decrease in appetite; Irritability; Sleep (too much or little); Tearfulness; Weight gain/loss; Worthlessness   Duration of Depressive symptoms:  Duration of Depressive Symptoms: Greater than two weeks   Mania:   None   Anxiety:     None   Psychosis:   None   Duration of Psychotic symptoms:    Trauma:   None   Obsessions:   None   Compulsions:   None   Inattention:   N/A   Hyperactivity/Impulsivity:   None   Oppositional/Defiant Behaviors:   None   Emotional Irregularity:   None   Other Mood/Personality Symptoms:  No data recorded   Mental Status Exam Appearance and self-care  Stature:   Average   Weight:   Average weight   Clothing:   Neat/clean   Grooming:   Normal   Cosmetic use:   None   Posture/gait:   Rigid   Motor activity:   Not Remarkable   Sensorium  Attention:   Normal   Concentration:   Normal   Orientation:   X5   Recall/memory:   Normal   Affect and Mood  Affect:   Depressed   Mood:   Depressed   Relating  Eye contact:   Fleeting   Facial expression:   Depressed; Responsive   Attitude toward examiner:   Resistant   Thought and Language  Speech flow:  Clear and Coherent   Thought content:   Appropriate to Mood and Circumstances   Preoccupation:   Suicide   Hallucinations:   None   Organization:  No data  recorded  [employer] of Knowledge:   Good   Intelligence:   Average   Abstraction:   Normal   Judgement:   Impaired   Dance movement psychotherapist:   Realistic   Insight:   Lacking   Decision Making:   Normal   Social Functioning  Social Maturity:   Responsible   Social Judgement:   Normal   Stress  Stressors:   Family conflict; Grief/losses; Work; Office manager Ability:   Deficient supports   Skill Deficits:   None   Supports:   Family     Religion: Religion/Spirituality Are You A Religious Person?: No  Leisure/Recreation: Leisure / Recreation Do You Have Hobbies?: Yes Leisure and Hobbies: watching TV, Advice worker, old movies  Exercise/Diet: Exercise/Diet Do You Exercise?: No Have You Gained or Lost A Significant Amount of Weight in the Past Six Months?: No Do You Follow a  Special Diet?: No Do You Have Any Trouble Sleeping?: No   CCA Employment/Education Employment/Work Situation: Employment / Work Situation Employment Situation: 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, 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 Patient's Job has Been Impacted by Current Illness: No Has Patient ever Been in Equities trader?: No  Education: Education Is Patient Currently Attending School?: No Last Grade Completed: 12 Did You Product manager?: No Did You Have An Individualized Education Program (IIEP): No Did You Have Any Difficulty At [employer]?: No Patient's Education Has Been Impacted by Current Illness: No   CCA Family/Childhood History Family and Relationship History: Family history Marital status: Married Number of Years Married: 34 What types of issues is patient dealing with in the relationship?: states his wife cares more about herself and her friends, not supportive Does patient have children?: No  Childhood History:  Childhood History By whom was/is the patient raised?: Both parents Did patient suffer any verbal/emotional/physical/sexual abuse as a child?: No Did patient suffer from severe childhood neglect?: No Has patient ever been sexually abused/assaulted/raped as an adolescent or adult?: No Was the patient ever a victim of a crime or a disaster?: No Witnessed domestic violence?: No Has patient been affected by domestic violence as an adult?: No  Child/Adolescent Assessment:     CCA Substance Use Alcohol/Drug Use: Alcohol / Drug Use Pain Medications: see MAR Prescriptions: see MAR Over the Counter: see MAR History of alcohol / drug use?: Yes Substance #1 Name of Substance 1: alcohol 1 - Age of First Use: teens 1 - Amount (size/oz): 1-6 beers 1 - Frequency: daily 1 - Duration: UTA 1 - Last Use / Amount: earlier this date 1 - Method of Aquiring: purchased 1- Route of Use: drank                       ASAM's:  Six Dimensions of  Multidimensional Assessment  Dimension 1:  Acute Intoxication and/or Withdrawal Potential:   Dimension 1:  Description of individual's past and current experiences of substance use and withdrawal: current daily alcohol use, no prior treatment  Dimension 2:  Biomedical Conditions and Complications:   Dimension 2:  Description of patient's biomedical conditions and  complications: none reported  Dimension 3:  Emotional, Behavioral, or Cognitive Conditions and Complications:  Dimension 3:  Description of emotional, behavioral, or cognitive conditions and complications: depressed, suicidal  Dimension 4:  Readiness to Change:  Dimension 4:  Description of Readiness to Change criteria: precontemplation  Dimension 5:  Relapse, Continued use, or Continued Problem Potential:  Dimension 5:  Relapse, continued use, or continued problem potential critiera description: patient appears to be minimizing use  Dimension 6:  Recovery/Living Environment:  Dimension 6:  Recovery/Iiving environment criteria description: has a stable living environment, but strained relationship with wife  ASAM Severity Score: ASAM's Severity Rating Score: 11  ASAM Recommended Level of Treatment: ASAM Recommended Level of Treatment: Level II Intensive Outpatient Treatment   Substance use Disorder (SUD) Substance Use Disorder (SUD)  Checklist Symptoms of Substance Use: Continued use despite persistent or recurrent social, interpersonal problems, caused or exacerbated by use, Evidence of tolerance, Presence of craving or strong urge to use, Social, occupational, recreational activities given up or reduced due to use, Substance(s) often taken in larger amounts or over longer times than was intended  Recommendations for Services/Supports/Treatments: Recommendations for Services/Supports/Treatments Recommendations For Services/Supports/Treatments: Individual Therapy, CD-IOP Intensive Chemical Dependency Program, Medication  Management  Discharge Disposition:    DSM5 Diagnoses: There are no problems to display for this patient.    Referrals to Alternative Service(s): Referred to Alternative Service(s):   Place:   Date:   Time:    Referred to Alternative Service(s):   Place:   Date:   Time:    Referred to Alternative Service(s):   Place:   Date:   Time:    Referred to Alternative Service(s):   Place:   Date:   Time:     Celedonio Miyamoto, LCSW

## 2021-01-10 NOTE — ED Triage Notes (Signed)
Pt presents to ED voluntary with SI. Pt very tearful in triage, states "I don't want to live, I wish I'd never been born." Pt states has been going on for a long time and he is tired of living. Pt states "I would like to take something, go to sleep and never wake up. I am sick of like."

## 2021-01-10 NOTE — ED Notes (Signed)
EDP at bedside  

## 2021-01-10 NOTE — ED Notes (Signed)
Called pharmacy for lotensin 10 mg due to not enough in second ER pyxis

## 2021-01-11 DIAGNOSIS — F332 Major depressive disorder, recurrent severe without psychotic features: Secondary | ICD-10-CM

## 2021-01-11 DIAGNOSIS — R45851 Suicidal ideations: Secondary | ICD-10-CM

## 2021-01-11 DIAGNOSIS — F1020 Alcohol dependence, uncomplicated: Secondary | ICD-10-CM

## 2021-01-11 NOTE — Consult Note (Addendum)
Telepsych Consultation   Reason for Consult:  SI Referring Physician:  Dr. Eber Hong Location of Patient: [facility] Location of Provider: Behavioral Health TTS Department  Patient Identification: ANUJ SUMMONS MRN:  [id number] Principal Diagnosis: Severe recurrent major depression without psychotic features [facility]) Diagnosis:  Principal Problem:   Severe recurrent major depression without psychotic features (HCC) Active Problems:   Alcohol use disorder, severe, dependence (HCC)   Total Time spent with patient: 30 minutes  Subjective:   EBAN WEICK is a 60 y.o. male patient admitted who presented to [facility] on 01/10/2021 due to SI. On evaluation this evening, patient states "I still have a feeling of wishing I would die."   HPI: Patient reports worsening depression. He states that he prays to God to take his life. He reports that suicide is a sin and he does not want to "burn in hell," so he does not feel like he would ever attempt suicide. But states "There are numerous ways to kill myself, I just want them to do it." Patient reports having access to guns via relatives and friends, but denies access to guns and other weapons at home. When asked if he would feel safe if discharged home, he states "maybe." Patient denies HI. He denies auditory and visual hallucinations. No indication that he is responding to internal stimuli. Denies paranoia. No delusions elicited during this evaluation. Patient reports daily use of alcohol for several years. Reports that he usually drinks 3-4 (12 ounce) beers per day. BAL on admission to ED  167. He denies a history of withdrawal seizures and delirium tremens. He denies current withdrawal symptoms. He denies use of marijuana and other illicit substances.   EDP note: UDAY JANTZ is a 60 y.o. male.   Per wife - states that he has been depressed for a while - lost his job when [employer] hit - since then has been "not right" - between jobs - he "doesn't want to work" - lost  mother to Covid 06/12/19, father very sick with Covid.  Told her he doesn't want to live - he went to work this morning - states that he doesn't want to live anymore.  He is drinking "too much" - spouse can't tell me how much - but estimates 12 pack / day - mostly beer.   Pt tells me "I have nothing to live for", "I have served my purpose", "I have no reason to wake up" - states that he wants to take some sleeping pills and now wake up - "I have nothing to offer".   He is here on his own will - but states he "wants to die"   Takes Xanax at night.  And BP meds.  Had tried antidepressant in past but "made me shaky".    Past Psychiatric History: Depression, Anxiety, alcohol abuse, denies history of psychiatric inpatient admission.   Risk to Self:   Risk to Others:   Prior Inpatient Therapy:   Prior Outpatient Therapy:    Past Medical History:  Past Medical History:  Diagnosis Date   Hypertension    History reviewed. No pertinent surgical history. Family History: History reviewed. No pertinent family history. Social History:  Social History   Substance and Sexual Activity  Alcohol Use Yes     Social History   Substance and Sexual Activity  Drug Use Not Currently    Social History   Socioeconomic History   Marital status: Married    Spouse name: Not on file  Number of children: Not on file   Years of education: Not on file   Highest education level: Not on file  Occupational History   Not on file  Tobacco Use   Smoking status: Every Day    Packs/day: 0.50    Types: Cigarettes   Smokeless tobacco: Never  Vaping Use   Vaping Use: Never used  Substance and Sexual Activity   Alcohol use: Yes   Drug use: Not Currently   Sexual activity: Not on file  Other Topics Concern   Not on file  Social History Narrative   Not on file   Social Determinants of Health   Financial Resource Strain: Not on file  Food Insecurity: Not on file  Transportation Needs: Not on file   Physical Activity: Not on file  Stress: Not on file  Social Connections: Not on file   Additional Social History:    Allergies:   Allergies  Allergen Reactions   Codeine     Labs:  Results for orders placed or performed during the hospital encounter of 01/10/21 (from the past 48 hour(s))  Comprehensive metabolic panel     Status: Abnormal   Collection Time: 01/10/21  9:42 AM  Result Value Ref Range   Sodium 135 135 - 145 mmol/L   Potassium 3.4 (L) 3.5 - 5.1 mmol/L   Chloride 102 98 - 111 mmol/L   CO2 23 22 - 32 mmol/L   Glucose, Bld 104 (H) 70 - 99 mg/dL    Comment: Glucose reference range applies only to samples taken after fasting for at least 8 hours.   BUN 8 6 - 20 mg/dL   Creatinine, Ser 0.27 0.61 - 1.24 mg/dL   Calcium 8.4 (L) 8.9 - 10.3 mg/dL   Total Protein 7.5 6.5 - 8.1 g/dL   Albumin 4.3 3.5 - 5.0 g/dL   AST 46 (H) 15 - 41 U/L   ALT 25 0 - 44 U/L   Alkaline Phosphatase 51 38 - 126 U/L   Total Bilirubin 0.4 0.3 - 1.2 mg/dL   GFR, Estimated >74 >12 mL/min    Comment: (NOTE) Calculated using the CKD-EPI Creatinine Equation (2021)    Anion gap 10 5 - 15    Comment: Performed at [facility], [address], [place], [place] [address]  Ethanol     Status: Abnormal   Collection Time: 01/10/21  9:42 AM  Result Value Ref Range   Alcohol, Ethyl (B) 167 (H) <10 mg/dL    Comment: (NOTE) Lowest detectable limit for serum alcohol is 10 mg/dL.  For medical purposes only. Performed at [facility], [address] [place], [place] [address]   CBC with Diff     Status: Abnormal   Collection Time: 01/10/21  9:42 AM  Result Value Ref Range   WBC 3.3 (L) 4.0 - 10.5 K/uL   RBC 4.21 (L) 4.22 - 5.81 MIL/uL   Hemoglobin 14.0 13.0 - 17.0 g/dL   HCT 47.0 96.2 - 83.6 %   MCV 96.7 80.0 - 100.0 fL   MCH 33.3 26.0 - 34.0 pg   MCHC 34.4 30.0 - 36.0 g/dL   RDW 62.9 47.6 - 54.6 %   Platelets 228 150 - 400 K/uL   nRBC 0.0 0.0 - 0.2 %   Neutrophils Relative % 49 %   Neutro Abs  1.6 (L) 1.7 - 7.7 K/uL   Lymphocytes Relative 31 %   Lymphs Abs 1.0 0.7 - 4.0 K/uL   Monocytes Relative 14 %  Monocytes Absolute 0.4 0.1 - 1.0 K/uL   Eosinophils Relative 4 %   Eosinophils Absolute 0.1 0.0 - 0.5 K/uL   Basophils Relative 1 %   Basophils Absolute 0.0 0.0 - 0.1 K/uL   Immature Granulocytes 1 %   Abs Immature Granulocytes 0.02 0.00 - 0.07 K/uL    Comment: Performed at [facility], [address] [place], [place] [address]  Urine rapid drug screen (hosp performed)     Status: Abnormal   Collection Time: 01/10/21 12:23 PM  Result Value Ref Range   Opiates NONE DETECTED NONE DETECTED   Cocaine NONE DETECTED NONE DETECTED   Benzodiazepines POSITIVE (A) NONE DETECTED   Amphetamines NONE DETECTED NONE DETECTED   Tetrahydrocannabinol NONE DETECTED NONE DETECTED   Barbiturates NONE DETECTED NONE DETECTED    Comment: (NOTE) DRUG SCREEN FOR MEDICAL PURPOSES ONLY.  IF CONFIRMATION IS NEEDED FOR ANY PURPOSE, NOTIFY LAB WITHIN 5 DAYS.  LOWEST DETECTABLE LIMITS FOR URINE DRUG SCREEN Drug Class                     Cutoff (ng/mL) Amphetamine and metabolites    1000 Barbiturate and metabolites    200 Benzodiazepine                 200 Tricyclics and metabolites     300 Opiates and metabolites        300 Cocaine and metabolites        300 THC                            50 Performed at [facility], [address] [place], [place] [address]     Medications:  Current Facility-Administered Medications  Medication Dose Route Frequency Provider Last Rate Last Admin   alum & mag hydroxide-simeth (MAALOX/MYLANTA) 200-200-20 MG/5ML suspension 30 mL  30 mL Oral Q6H PRN Eber HongMiller, Brian, MD   30 mL at 01/11/21 2121   amLODipine (NORVASC) tablet 10 mg  10 mg Oral Daily Eber HongMiller, Brian, MD   10 mg at 01/11/21 0941   And   benazepril (LOTENSIN) tablet 20 mg  20 mg Oral Daily Eber HongMiller, Brian, MD   20 mg at 01/11/21 1501   ibuprofen (ADVIL) tablet 600 mg  600 mg Oral Q8H PRN Eber HongMiller, Brian, MD        LORazepam (ATIVAN) injection 0-4 mg  0-4 mg Intravenous Q6H Eber HongMiller, Brian, MD       Or   LORazepam (ATIVAN) tablet 0-4 mg  0-4 mg Oral Q6H Eber HongMiller, Brian, MD   1 mg at 01/11/21 2121   [START ON 01/12/2021] LORazepam (ATIVAN) injection 0-4 mg  0-4 mg Intravenous Donn PieriniQ12H Miller, Brian, MD       Or   Melene Muller[START ON 01/12/2021] LORazepam (ATIVAN) tablet 0-4 mg  0-4 mg Oral Q12H Eber HongMiller, Brian, MD       nicotine (NICODERM CQ - dosed in mg/24 hours) patch 21 mg  21 mg Transdermal Daily Eber HongMiller, Brian, MD   21 mg at 01/10/21 2149   ondansetron (ZOFRAN) tablet 4 mg  4 mg Oral Q8H PRN Eber HongMiller, Brian, MD       thiamine tablet 100 mg  100 mg Oral Daily Eber HongMiller, Brian, MD   100 mg at 01/11/21 [id number]   Or   thiamine (B-1) injection 100 mg  100 mg Intravenous Daily Eber HongMiller, Brian, MD       Current Outpatient Medications  Medication Sig Dispense Refill  ALPRAZolam (XANAX) 1 MG tablet Take 1 mg by mouth 3 (three) times daily.     amLODipine-benazepril (LOTREL) 10-20 MG capsule Take 1 capsule by mouth daily.     betamethasone dipropionate 0.05 % cream Apply 1 application topically 2 (two) times daily.     olmesartan (BENICAR) 40 MG tablet Take 40 mg by mouth daily.         Psychiatric Specialty Exam:  Presentation  General Appearance: Appropriate for Environment; Casual  Eye Contact:Good  Speech:Clear and Coherent; Normal Rate  Speech Volume:Normal  Handedness: No data recorded  Mood and Affect  Mood:Depressed; Hopeless; Worthless  Affect:Congruent; Depressed   Thought Process  Thought Processes:Coherent; Goal Directed  Descriptions of Associations:Intact  Orientation:Full (Time, Place and Person)  Thought Content:Logical  History of Schizophrenia/Schizoaffective disorder:No  Duration of Psychotic Symptoms:No data recorded Hallucinations:Hallucinations: None  Ideas of Reference:None  Suicidal Thoughts:Suicidal Thoughts: Yes, Passive SI Passive Intent and/or Plan: With Intent; Without  Plan  Homicidal Thoughts:Homicidal Thoughts: No   Sensorium  Memory:Immediate Good; Recent Good; Remote Good  Judgment:Impaired  Insight:Lacking   Executive Functions  Concentration:Good  Attention Span:Good  Recall:Good  Fund of Knowledge:Good  Language:Good   Psychomotor Activity  Psychomotor Activity:Psychomotor Activity: Normal   Assets  Assets:Communication Skills; Desire for Improvement; Physical Health; Financial Resources/Insurance; Housing; Social Support   Sleep  Sleep:Sleep: Fair    Physical Exam: Physical Exam Constitutional:      General: He is not in acute distress. Neurological:     Mental Status: He is alert and oriented to person, place, and time.  Psychiatric:        Mood and Affect: Mood is depressed.        Behavior: Behavior is cooperative.        Thought Content: Thought content is not paranoid or delusional. Thought content includes suicidal ideation. Thought content does not include homicidal ideation. Thought content does not include suicidal plan.   Review of Systems  Constitutional:  Negative for chills, fever and weight loss.  Neurological:  Negative for tremors and seizures.  Psychiatric/Behavioral:  Positive for depression, substance abuse and suicidal ideas. Negative for hallucinations and memory loss. The patient is nervous/anxious and has insomnia.    Blood pressure (!) 127/94, pulse 79, temperature 98.4 F (36.9 C), temperature source Oral, resp. rate 18, height 5\' 10"  (1.778 m), weight 68 kg, SpO2 99 %. Body mass index is 21.52 kg/m.   Disposition: Recommend psychiatric Inpatient admission when medically cleared. No appropriate beds available at [facility]. Patient faxed out to inpatient facilities by disposition SW.  Patient accepted for inpatient admission at [facility].  This service was provided via telemedicine using a 2-way, interactive audio and video technology.  Names of all persons participating in  this telemedicine service and their role in this encounter.  Name: Rayshard Schirtzinger Role: Patient  Name: Humberto Leep Role: PMHNP  Name:  Role:   Name:  Role:     Nira Conn, NP 01/11/2021 11:55 PM

## 2021-01-11 NOTE — BH Assessment (Addendum)
Disposition:   Per Nira Conn, NP, patient meets criteria for inpatient psychiatric placement. Per morning [facility] AC, no beds available for today. Disposition Counselor faxed patient out to multiple hospitals for consideration of bed placement. See list below. Also, hand faxed patient to [facility] in [place], [place].   [facility]  Pending - Request Sent N/A [address], [place] [address] [phone] --  [facility]  Pending - Request Sent N/A [address], [place] [address] [phone] --  [facility]  Pending - Request Sent N/A [address] [place], [place] [address] [phone] --  [facility]  Pending - Request Sent N/A [address] [place] [address] [phone] --  [facility]  Pending - Request Sent N/A [address] [place] [address] [phone] --  [facility]  Pending - Request Sent N/A [address] [place], [place] [address] [phone] --  [facility]  Pending - Request Sent N/A [address] [place] [address] [phone] --  [facility]  Pending - Request Sent N/A [address] [place] [address] [phone] --  [facility]  Pending - Request Sent N/A [phone] [place] [address] [phone] --  [facility]  Pending - Request Sent N/A [address] [place], [place] [address] [phone] --  [facility]  Pending - Request Sent N/A [address] [place] [address] [phone] --  [facility]  Pending - Request Sent N/A [address] [place] [address] [phone] --  [facility]  Pending - Request Sent N/A  [address], [place] [address] [phone] --  [facility]  Pending - Request Sent N/A [address] [place] [address] [phone] --  [facility]  Pending - Request Sent N/A [address] [place] [address] [phone] --  [facility]  Pending - Request Sent N/A [address], [place] [address] [phone] --  [facility]  Pending - Request Sent N/A [address] [place] [address] [phone] --  [facility]  Pending - Request Sent N/A [address] [place] [address] [phone] --  [facility]  Pending - Request Sent N/A [address], [place] [address] [phone] --  [facility]  Pending - Request Sent N/A [address], [place] [address] [phone] --  [facility]  Pending - Request Sent N/A [address], [place] [address] [phone] --  [facility]  Pending - Request Sent N/A [address] [place] [address] [phone] --  [facility]  Pending - Request Sent N/A [address] [place], [place] [address] [phone] --  [facility]  Pending - Request Sent N/A [address], [place] [address] [phone] --  [facility]  Pending - Request Sent N/A [address] [place] [address] [phone] --  [facility]  Pending - Request Sent N/A [address], [place] [address] [phone] --

## 2021-01-12 LAB — RESP PANEL BY RT-PCR (FLU A&B, COVID) ARPGX2
Influenza A by PCR: NEGATIVE
Influenza B by PCR: NEGATIVE
SARS Coronavirus 2 by RT PCR: NEGATIVE

## 2021-01-12 LAB — URINALYSIS, ROUTINE W REFLEX MICROSCOPIC
Bacteria, UA: NONE SEEN
Bilirubin Urine: NEGATIVE
Glucose, UA: >=500 mg/dL — AB
Hgb urine dipstick: NEGATIVE
Ketones, ur: NEGATIVE mg/dL
Leukocytes,Ua: NEGATIVE
Nitrite: NEGATIVE
Protein, ur: NEGATIVE mg/dL
Specific Gravity, Urine: 1.009 (ref 1.005–1.030)
pH: 6 (ref 5.0–8.0)

## 2021-01-12 LAB — CBG MONITORING, ED: Glucose-Capillary: 112 mg/dL — ABNORMAL HIGH (ref 70–99)

## 2021-01-12 NOTE — ED Notes (Signed)
Patient's wife called and would like patient's clothes, wallet, car keys and cellphone. Patient verbalized agreement to this plan and will give belongings to wife on arrival to ED.

## 2021-01-12 NOTE — ED Notes (Addendum)
Pt ambulatory to bathroom

## 2021-01-12 NOTE — ED Notes (Signed)
Wife states she would be here to take patient's belongings home with her within the hour. Wife updated on bed placement and patient going to [facility].

## 2021-01-12 NOTE — ED Notes (Signed)
UA and covid results faxed to [facility]

## 2021-01-12 NOTE — BH Assessment (Signed)
Comprehensive Clinical Assessment (CCA) Screening, Triage and Referral Note  01/12/2021 Eric Conley [id number]  Patient continues to meet criteria for inpatient treatment.   Eric Conley reassessed to determine if he continues to meet inpatient treatment. Patient reports ED visit due to "I wasn't feeling like myself. I woke up every morning feeling like I didn't want to wake up". Patient reports feeling this way on and off for about a month. Patient reports that his mother passed away two years ago and now his father who he has a good relationship is "crippled". Patient states that he sees his father weekly and talk to him daily. Patient denies SI, HI, AVH and reports "I don't want to kill myself. I just want to sleep and not wake up. I want to be happy like everyone else".  Pt continues to have thoughts of wanting to be dead. Pt reports consuming 12 pack of beer daily. Patient does not have outpatient services however reports history of taking antidepressants years ago prescribed by his PCP. Pt is feels that he needs inpt treatment and reports that his wife of 34 years also think he could benefit from inpt treatment.   Chief Complaint:  Chief Complaint  Patient presents with   V70.1   Visit Diagnosis: Severe recurrent major depression without psychotic features [facility])  Patient Reported Information How did you hear about [place]? Self  What Is the Reason for Your Visit/Call Today? suicidal ideation  How Long Has This Been Causing You Problems? > than 6 months  What Do You Feel Would Help You the Most Today? Treatment for Depression or other mood problem   Have You Recently Had Any Thoughts About Hurting Yourself? Yes  Are You Planning to Commit Suicide/Harm Yourself At This time? No   Have you Recently Had Thoughts About Hurting Someone Eric Conley? No  Are You Planning to Harm Someone at This Time? No  Explanation: No data recorded  Have You Used Any Alcohol or Drugs in the Past 24 Hours?  No  How Long Ago Did You Use Drugs or Alcohol? No data recorded What Did You Use and How Much? No data recorded  Do You Currently Have a Therapist/Psychiatrist? No  Name of Therapist/Psychiatrist: No data recorded  Have You Been Recently Discharged From Any Office Practice or Programs? No  Explanation of Discharge From Practice/Program: No data recorded   CCA Screening Triage Referral Assessment Type of Contact: Tele-Assessment  Telemedicine Service Delivery: Telemedicine service delivery: This service was provided via telemedicine using a 2-way, interactive audio and video technology  Is this Initial or Reassessment? Reassessment  Date Telepsych consult ordered in [facility]:  01/10/21  Time Telepsych consult ordered in [facility]:  1101  Location of Assessment: [facility]  Provider Location: [facility]   Collateral Involvement: NA   Does Patient Have a Automotive engineer Guardian? No data recorded Name and Contact of Legal Guardian: No data recorded If Minor and Not Living with Parent(s), Who has Custody? No data recorded Is CPS involved or ever been involved? Never  Is APS involved or ever been involved? Never   Patient Determined To Be At Risk for Harm To Self or Others Based on Review of Patient Reported Information or Presenting Complaint? Yes, for Self-Harm  Method: No data recorded Availability of Means: No data recorded Intent: No data recorded Notification Required: No data recorded Additional Information for Danger to Others Potential: No data recorded Additional Comments for Danger to Others Potential: No data recorded  Are There Guns or Other Weapons in Your Home? No data recorded Types of Guns/Weapons: No data recorded Are These Weapons Safely Secured?                            No data recorded Who Could Verify You Are Able To Have These Secured: No data recorded Do You Have any Outstanding Charges, Pending Court Dates, Parole/Probation? No data  recorded Contacted To Inform of Risk of Harm To Self or Others: No data recorded  Does Patient Present under Involuntary Commitment? Yes  IVC Papers Initial File Date: 01/10/21   [place] of Residence: [place]   Patient Currently Receiving the Following Services: Not Receiving Services   Determination of Need: Emergent (2 hours)   Options For Referral: Intensive Outpatient Therapy; Inpatient Hospitalization; Medication Management; Outpatient Therapy   Discharge Disposition:     Eric Conley, [facility]

## 2021-01-12 NOTE — ED Notes (Signed)
Pt denies SI/HI/AVH. VSS. Pt medicated per MAR. Sitter at bedside. Will continue to monitor.

## 2021-01-12 NOTE — ED Provider Notes (Addendum)
Emergency Medicine Observation Re-evaluation Note  Eric Conley is a 60 y.o. male, seen on rounds today.  Pt initially presented to the ED for complaints of V70.1 Currently, the patient is sleeping.  Physical Exam  BP 128/90 (BP Location: Left Arm)   Pulse 82   Temp 97.8 F (36.6 C) (Oral)   Resp 20   Ht 5\' 10"  (1.778 m)   Wt 68 kg   SpO2 97%   BMI 21.52 kg/m  Physical Exam General: No distress Lungs: Resp even and unlabored Psych: Sleeping soundly  ED Course / MDM  EKG:   I have reviewed the labs performed to date as well as medications administered while in observation.  Recent changes in the last 24 hours include none.  Plan  Current plan is for inpatient psychiatric placement.  Eric Conley is not under involuntary commitment.  9:35 AM Correction to above, patient IS under IVC. This was not previously documented but paperwork had been completed by previous provider. Order placed in Epic.    2:03 PM [name]. EMTALA completed pending transport later this afternoon.    Mannie Stabile, MD 01/12/21 [phone]

## 2021-01-12 NOTE — Progress Notes (Signed)
Pt accepted [facility]      Patient meets inpatient criteria per Nira Conn, NP   Dr.Lisardo Lorelee Cover is the attending provider.    Call report to [phone]  Freda Jackson, RN @ [facility] notified.     Pt scheduled  to arrive at [facility] today by 1600.   Damita Dunnings, MSW, LCSW-A  1:55 PM 01/12/2021

## 2021-01-12 NOTE — ED Notes (Signed)
Pt states he feels "shaky" and is asking when his next dose of ativan is scheduled. Nurse explained to pt that he is not available for more medication until 3:30 and to try to rest at this time. Pt does appear anxious at this time.

## 2021-01-12 NOTE — ED Notes (Signed)
Tele-psych at bedside.

## 2021-01-25 ENCOUNTER — Inpatient Hospital Stay (HOSPITAL_COMMUNITY)
Admission: EM | Admit: 2021-01-25 | Discharge: 2021-01-28 | DRG: 683 | Disposition: A | Discharge status: Home / Self Care | Payer: 59 | Source: Ambulatory Visit | Attending: Internal Medicine | Admitting: Internal Medicine

## 2021-01-25 ENCOUNTER — Encounter (HOSPITAL_COMMUNITY): Payer: Self-pay | Admitting: *Deleted

## 2021-01-25 ENCOUNTER — Other Ambulatory Visit: Payer: Self-pay

## 2021-01-25 DIAGNOSIS — I959 Hypotension, unspecified: Secondary | ICD-10-CM | POA: Diagnosis present

## 2021-01-25 DIAGNOSIS — Z79899 Other long term (current) drug therapy: Secondary | ICD-10-CM

## 2021-01-25 DIAGNOSIS — E861 Hypovolemia: Secondary | ICD-10-CM | POA: Diagnosis present

## 2021-01-25 DIAGNOSIS — I10 Essential (primary) hypertension: Secondary | ICD-10-CM

## 2021-01-25 DIAGNOSIS — N179 Acute kidney failure, unspecified: Principal | ICD-10-CM | POA: Diagnosis present

## 2021-01-25 DIAGNOSIS — E860 Dehydration: Secondary | ICD-10-CM | POA: Diagnosis present

## 2021-01-25 DIAGNOSIS — F17210 Nicotine dependence, cigarettes, uncomplicated: Secondary | ICD-10-CM | POA: Diagnosis present

## 2021-01-25 DIAGNOSIS — R197 Diarrhea, unspecified: Secondary | ICD-10-CM | POA: Diagnosis present

## 2021-01-25 DIAGNOSIS — F332 Major depressive disorder, recurrent severe without psychotic features: Secondary | ICD-10-CM | POA: Diagnosis present

## 2021-01-25 DIAGNOSIS — Z885 Allergy status to narcotic agent status: Secondary | ICD-10-CM

## 2021-01-25 DIAGNOSIS — E871 Hypo-osmolality and hyponatremia: Secondary | ICD-10-CM | POA: Diagnosis present

## 2021-01-25 HISTORY — DX: Depression, unspecified: F32.A

## 2021-01-25 LAB — CBC WITH DIFFERENTIAL/PLATELET
Abs Immature Granulocytes: 0.07 10*3/uL (ref 0.00–0.07)
Basophils Absolute: 0 10*3/uL (ref 0.0–0.1)
Basophils Relative: 0 %
Eosinophils Absolute: 0 10*3/uL (ref 0.0–0.5)
Eosinophils Relative: 0 %
HCT: 37.1 % — ABNORMAL LOW (ref 39.0–52.0)
Hemoglobin: 13.5 g/dL (ref 13.0–17.0)
Immature Granulocytes: 1 %
Lymphocytes Relative: 9 %
Lymphs Abs: 0.8 10*3/uL (ref 0.7–4.0)
MCH: 32.6 pg (ref 26.0–34.0)
MCHC: 36.4 g/dL — ABNORMAL HIGH (ref 30.0–36.0)
MCV: 89.6 fL (ref 80.0–100.0)
Monocytes Absolute: 1 10*3/uL (ref 0.1–1.0)
Monocytes Relative: 11 %
Neutro Abs: 7.6 10*3/uL (ref 1.7–7.7)
Neutrophils Relative %: 79 %
Platelets: 339 10*3/uL (ref 150–400)
RBC: 4.14 MIL/uL — ABNORMAL LOW (ref 4.22–5.81)
RDW: 11.2 % — ABNORMAL LOW (ref 11.5–15.5)
WBC: 9.6 10*3/uL (ref 4.0–10.5)
nRBC: 0 % (ref 0.0–0.2)

## 2021-01-25 LAB — CORTISOL: Cortisol, Plasma: 26.1 ug/dL

## 2021-01-25 LAB — TROPONIN I (HIGH SENSITIVITY)
Troponin I (High Sensitivity): 2 ng/L (ref <18)
Troponin I (High Sensitivity): <2 ng/L (ref <18)

## 2021-01-25 LAB — BASIC METABOLIC PANEL
Anion gap: 13 (ref 5–15)
BUN: 44 mg/dL — ABNORMAL HIGH (ref 6–20)
CO2: 19 mmol/L — ABNORMAL LOW (ref 22–32)
Calcium: 8.2 mg/dL — ABNORMAL LOW (ref 8.9–10.3)
Chloride: 83 mmol/L — ABNORMAL LOW (ref 98–111)
Creatinine, Ser: 2.99 mg/dL — ABNORMAL HIGH (ref 0.61–1.24)
GFR, Estimated: 24 mL/min — ABNORMAL LOW (ref >60)
Glucose, Bld: 121 mg/dL — ABNORMAL HIGH (ref 70–99)
Potassium: 3.4 mmol/L — ABNORMAL LOW (ref 3.5–5.1)
Sodium: 115 mmol/L — CL (ref 135–145)

## 2021-01-25 LAB — NA AND K (SODIUM & POTASSIUM), RAND UR
Potassium Urine: 17 mmol/L
Sodium, Ur: <10 mmol/L

## 2021-01-25 LAB — BRAIN NATRIURETIC PEPTIDE: B Natriuretic Peptide: 8 pg/mL (ref 0.0–100.0)

## 2021-01-25 LAB — LACTIC ACID, PLASMA: Lactic Acid, Venous: 1.1 mmol/L (ref 0.5–1.9)

## 2021-01-25 LAB — HIV ANTIBODY (ROUTINE TESTING W REFLEX): HIV Screen 4th Generation wRfx: NONREACTIVE

## 2021-01-25 LAB — SODIUM: Sodium: 111 mmol/L — CL (ref 135–145)

## 2021-01-25 LAB — COMPREHENSIVE METABOLIC PANEL
ALT: 20 U/L (ref 0–44)
AST: 19 U/L (ref 15–41)
Albumin: 4.1 g/dL (ref 3.5–5.0)
Alkaline Phosphatase: 58 U/L (ref 38–126)
Anion gap: 13 (ref 5–15)
BUN: 44 mg/dL — ABNORMAL HIGH (ref 6–20)
CO2: 19 mmol/L — ABNORMAL LOW (ref 22–32)
Calcium: 8.4 mg/dL — ABNORMAL LOW (ref 8.9–10.3)
Chloride: 79 mmol/L — ABNORMAL LOW (ref 98–111)
Creatinine, Ser: 3.27 mg/dL — ABNORMAL HIGH (ref 0.61–1.24)
GFR, Estimated: 21 mL/min — ABNORMAL LOW (ref >60)
Glucose, Bld: 114 mg/dL — ABNORMAL HIGH (ref 70–99)
Potassium: 3.6 mmol/L (ref 3.5–5.1)
Sodium: 111 mmol/L — CL (ref 135–145)
Total Bilirubin: 0.5 mg/dL (ref 0.3–1.2)
Total Protein: 6.8 g/dL (ref 6.5–8.1)

## 2021-01-25 LAB — SALICYLATE LEVEL: Salicylate Lvl: <7 mg/dL — ABNORMAL LOW (ref 7.0–30.0)

## 2021-01-25 LAB — ACETAMINOPHEN LEVEL: Acetaminophen (Tylenol), Serum: <10 ug/mL — ABNORMAL LOW (ref 10–30)

## 2021-01-25 LAB — OSMOLALITY: Osmolality: 250 mOsm/kg — ABNORMAL LOW (ref 275–295)

## 2021-01-25 LAB — PHOSPHORUS: Phosphorus: 5.6 mg/dL — ABNORMAL HIGH (ref 2.5–4.6)

## 2021-01-25 LAB — OSMOLALITY, URINE: Osmolality, Ur: 230 mOsm/kg — ABNORMAL LOW (ref 300–900)

## 2021-01-25 LAB — MRSA NEXT GEN BY PCR, NASAL: MRSA by PCR Next Gen: NOT DETECTED

## 2021-01-25 LAB — MAGNESIUM: Magnesium: 1.5 mg/dL — ABNORMAL LOW (ref 1.7–2.4)

## 2021-01-25 MED ORDER — ONDANSETRON HCL 4 MG PO TABS: 4.0000 mg | ORAL_TABLET | Freq: Four times a day (QID) | ORAL | Status: DC | PRN

## 2021-01-25 MED ORDER — LORAZEPAM 1 MG PO TABS: 1.0000 mg | ORAL_TABLET | ORAL | Status: DC | PRN

## 2021-01-25 MED ORDER — FOLIC ACID 1 MG PO TABS
1.0000 mg | ORAL_TABLET | Freq: Every day | ORAL | Status: DC
  Administered 2021-01-25 – 2021-01-28 (×4): 1 mg via ORAL
  Filled 2021-01-25 (×4): qty 1

## 2021-01-25 MED ORDER — LORAZEPAM 2 MG/ML IJ SOLN: 1.0000 mg | INTRAMUSCULAR | Status: DC | PRN

## 2021-01-25 MED ORDER — SODIUM CHLORIDE 0.9 % IV SOLN: INTRAVENOUS | Status: AC

## 2021-01-25 MED ORDER — LACTATED RINGERS IV BOLUS: 1000.0000 mL | Freq: Once | INTRAVENOUS | Status: DC

## 2021-01-25 MED ORDER — ADULT MULTIVITAMIN W/MINERALS CH
1.0000 | ORAL_TABLET | Freq: Every day | ORAL | Status: DC
  Administered 2021-01-25 – 2021-01-28 (×4): 1 via ORAL
  Filled 2021-01-25 (×4): qty 1

## 2021-01-25 MED ORDER — TRIAMCINOLONE ACETONIDE 0.5 % EX CREA
TOPICAL_CREAM | Freq: Two times a day (BID) | CUTANEOUS | Status: DC
  Filled 2021-01-25: qty 15

## 2021-01-25 MED ORDER — BETAMETHASONE DIPROPIONATE 0.05 % EX CREA: 1.0000 "application " | TOPICAL_CREAM | Freq: Two times a day (BID) | CUTANEOUS | Status: DC

## 2021-01-25 MED ORDER — HYDRALAZINE HCL 20 MG/ML IJ SOLN: 10.0000 mg | INTRAMUSCULAR | Status: DC | PRN

## 2021-01-25 MED ORDER — FLORANEX PO PACK
1.0000 g | PACK | Freq: Three times a day (TID) | ORAL | Status: DC
  Administered 2021-01-25 – 2021-01-28 (×8): 1 g via ORAL
  Filled 2021-01-25 (×15): qty 1

## 2021-01-25 MED ORDER — SODIUM CHLORIDE 0.9% FLUSH: 3.0000 mL | INTRAVENOUS | Status: DC | PRN

## 2021-01-25 MED ORDER — LEVALBUTEROL HCL 0.63 MG/3ML IN NEBU: 0.6300 mg | INHALATION_SOLUTION | Freq: Four times a day (QID) | RESPIRATORY_TRACT | Status: DC | PRN

## 2021-01-25 MED ORDER — ACETAMINOPHEN 650 MG RE SUPP: 650.0000 mg | Freq: Four times a day (QID) | RECTAL | Status: DC | PRN

## 2021-01-25 MED ORDER — ACETAMINOPHEN 325 MG PO TABS: 650.0000 mg | ORAL_TABLET | Freq: Four times a day (QID) | ORAL | Status: DC | PRN

## 2021-01-25 MED ORDER — MAGNESIUM SULFATE 2 GM/50ML IV SOLN
2.0000 g | Freq: Once | INTRAVENOUS | Status: AC
  Administered 2021-01-25: 2 g via INTRAVENOUS
  Filled 2021-01-25: qty 50

## 2021-01-25 MED ORDER — HEPARIN SODIUM (PORCINE) 5000 UNIT/ML IJ SOLN
5000.0000 [IU] | Freq: Three times a day (TID) | INTRAMUSCULAR | Status: DC
  Administered 2021-01-25 – 2021-01-28 (×9): 5000 [IU] via SUBCUTANEOUS
  Filled 2021-01-25 (×9): qty 1

## 2021-01-25 MED ORDER — THIAMINE HCL 100 MG PO TABS
100.0000 mg | ORAL_TABLET | Freq: Every day | ORAL | Status: DC
  Administered 2021-01-25 – 2021-01-27 (×3): 100 mg via ORAL
  Filled 2021-01-25 (×4): qty 1

## 2021-01-25 MED ORDER — HYDROMORPHONE HCL 1 MG/ML IJ SOLN
0.5000 mg | INTRAMUSCULAR | Status: DC | PRN
  Administered 2021-01-25: 0.5 mg via INTRAVENOUS
  Administered 2021-01-26: 1 mg via INTRAVENOUS
  Filled 2021-01-25 (×2): qty 1

## 2021-01-25 MED ORDER — SODIUM CHLORIDE 0.9 % IV SOLN: 250.0000 mL | INTRAVENOUS | Status: DC | PRN

## 2021-01-25 MED ORDER — ONDANSETRON HCL 4 MG/2ML IJ SOLN: 4.0000 mg | Freq: Four times a day (QID) | INTRAMUSCULAR | Status: DC | PRN

## 2021-01-25 MED ORDER — SODIUM CHLORIDE 0.9% FLUSH
3.0000 mL | Freq: Two times a day (BID) | INTRAVENOUS | Status: DC
  Administered 2021-01-26 – 2021-01-28 (×2): 3 mL via INTRAVENOUS

## 2021-01-25 MED ORDER — IPRATROPIUM BROMIDE 0.02 % IN SOLN: 0.5000 mg | Freq: Four times a day (QID) | RESPIRATORY_TRACT | Status: DC | PRN

## 2021-01-25 MED ORDER — THIAMINE HCL 100 MG/ML IJ SOLN
100.0000 mg | Freq: Every day | INTRAMUSCULAR | Status: DC
  Administered 2021-01-28: 100 mg via INTRAVENOUS
  Filled 2021-01-25: qty 2

## 2021-01-25 MED ORDER — SODIUM CHLORIDE 0.9% FLUSH
3.0000 mL | Freq: Two times a day (BID) | INTRAVENOUS | Status: DC
  Administered 2021-01-25 – 2021-01-28 (×4): 3 mL via INTRAVENOUS

## 2021-01-25 NOTE — ED Notes (Signed)
Date and time results received: 01/25/21 12:09 PM   Test: Sodium Critical Value: 111  Name of Provider Notified: Dr. Audrie Lia  Orders Received? Or Actions Taken? See orders

## 2021-01-25 NOTE — ED Notes (Signed)
Per Dr. Posey Rea hold the IVF at this time due to hyponatremia

## 2021-01-25 NOTE — H&P (Signed)
History and Physical   Patient: Eric Conley                            PCP: Samuella Bruin                    DOB: August 04, 1960            DOA: 01/25/2021 NID:[id number]             DOS: 01/25/2021, 12:36 PM  Shawnie Dapper, PA-C  Patient coming from:   HOME  I have personally reviewed patient's medical records, in electronic medical records, including:  [facility], and care everywhere.    Chief Complaint:   Chief Complaint  Patient presents with   Hypotension    History of present illness:    Eric Conley is a 60 y.o. male with medical history significant of   hypertension, depression, previous history of alcohol abuse..  Presenting for persistent diarrhea, generalized weaknesses. Was seen by PCP today was sent to the ED for hypotension, generalized weakness and diarrhea    Patient Denies having: Fever, Chills, Cough, SOB, Chest Pain, Abd pain, nausea or vomiting, headache, dizziness, lightheadedness,  Dysuria, Joint pain, rash, open wounds  ED Course:   Vitals:   01/25/21 1057 01/25/21 1210  BP: (!) 92/57 101/67  Pulse: (!) 104 73  Resp: 14 18  Temp: 97.6 F (36.4 C)   SpO2: 100% 100%   Abnormal labs; Sodium 111, potassium 3.6, BUN 44, creatinine 3.27, troponin 2, lactic acid 1.1, UA negative, alcohol level 167, urine drug screen positive benzodiazepine    Review of Systems: As per HPI, otherwise 10 point review of systems were negative.   ----------------------------------------------------------------------------------------------------------------------  Allergies  Allergen Reactions   Codeine     Home MEDs:  Prior to Admission medications   Medication Sig Start Date End Date Taking? Authorizing Provider  amLODipine (NORVASC) 10 MG tablet Take 10 mg by mouth daily. 01/20/21  Yes [provider]  benazepril (LOTENSIN) 20 MG tablet Take 20 mg by mouth daily. 01/20/21  Yes [provider]  amLODipine-benazepril (LOTREL) 10-20  MG capsule Take 1 capsule by mouth daily. 11/13/20   [provider]  betamethasone dipropionate 0.05 % cream Apply 1 application topically 2 (two) times daily. 10/23/20   [provider]  olmesartan (BENICAR) 40 MG tablet Take 40 mg by mouth daily. 12/15/20   [provider]    PRN MEDs: sodium chloride, acetaminophen **OR** acetaminophen, hydrALAZINE, HYDROmorphone (DILAUDID) injection, ipratropium, levalbuterol, ondansetron **OR** ondansetron (ZOFRAN) IV, sodium chloride flush  Past Medical History:  Diagnosis Date   Depression    Hypertension     History reviewed. No pertinent surgical history.   reports that he has been smoking cigarettes. He has been smoking an average of .5 packs per day. He has never used smokeless tobacco. He reports current alcohol use. He reports that he does not currently use drugs.   No family history on file.  Physical Exam:   Vitals:   01/25/21 1052 01/25/21 1057 01/25/21 1210  BP:  (!) 92/57 101/67  Pulse:  (!) 104 73  Resp:  14 18  Temp:  97.6 F (36.4 C)   TempSrc:  Oral   SpO2:  100% 100%  Weight: 63.5 kg    Height: 5\' 9"  (1.753 m)     Constitutional: NAD, calm, comfortable Eyes: PERRL, lids and conjunctivae normal ENMT:  Mucous membranes are moist. Posterior pharynx clear of any exudate or lesions.Normal dentition.  Neck: normal, supple, no masses, no thyromegaly Respiratory: clear to auscultation bilaterally, no wheezing, no crackles. Normal respiratory effort. No accessory muscle use.  Cardiovascular: Regular rate and rhythm, no murmurs / rubs / gallops. No extremity edema. 2+ pedal pulses. No carotid bruits.  Abdomen: no tenderness, no masses palpated. No hepatosplenomegaly. Bowel sounds positive.  Musculoskeletal: no clubbing / cyanosis. No joint deformity upper and lower extremities. Good ROM, no contractures. Normal muscle tone.  Neurologic: CN II-XII grossly intact. Sensation intact, DTR normal. Strength 5/5  in all 4.  Psychiatric: Normal judgment and insight. Alert and oriented x 3. Normal mood.  Skin: no rashes, lesions, ulcers. No induration Wounds: per nursing documentation     Labs on admission:    I have personally reviewed following labs and imaging studies  CBC: Recent Labs  Lab 01/25/21 1125  WBC 9.6  NEUTROABS 7.6  HGB 13.5  HCT 37.1*  MCV 89.6  PLT 339   Basic Metabolic Panel: Recent Labs  Lab 01/25/21 1125  NA 111*  K 3.6  CL 79*  CO2 19*  GLUCOSE 114*  BUN 44*  CREATININE 3.27*  CALCIUM 8.4*   GFR: Estimated Creatinine Clearance: 22.4 mL/min (A) (by C-G formula based on SCr of 3.27 mg/dL (H)). Liver Function Tests: Recent Labs  Lab 01/25/21 1125  AST 19  ALT 20  ALKPHOS 58  BILITOT 0.5  PROT 6.8  ALBUMIN 4.1  Urine analysis:    Component Value Date/Time   COLORURINE YELLOW 01/12/2021 0950   APPEARANCEUR CLEAR 01/12/2021 0950   LABSPEC 1.009 01/12/2021 0950   PHURINE 6.0 01/12/2021 0950   GLUCOSEU >=500 (A) 01/12/2021 0950   HGBUR NEGATIVE 01/12/2021 0950   BILIRUBINUR NEGATIVE 01/12/2021 0950   KETONESUR NEGATIVE 01/12/2021 0950   PROTEINUR NEGATIVE 01/12/2021 0950   NITRITE NEGATIVE 01/12/2021 0950   LEUKOCYTESUR NEGATIVE 01/12/2021 0950     Radiologic Exams on Admission:   No results found.  EKG:   Independently reviewed.  Orders placed or performed during the hospital encounter of 01/25/21   EKG 12-Lead   EKG 12-Lead   EKG 12-Lead   ---------------------------------------------------------------------------------------------------------------------------------------    Assessment / Plan:   Principal Problem:   Hyponatremia Active Problems:   ARF (acute renal failure) (HCC)   Hypotensive episode   Diarrhea   Severe recurrent major depression without psychotic features (HCC)   Dehydration   Essential hypertension   Principal Problem:   Hyponatremia -Likely exacerbated by persistent diarrhea,  hypovolemia, -Patient has received 1 L of lactated Ringer in ED, will resume with normal saline hydration -Current sodium level 111 >>> -We will correct serum sodium level slowly... No focal neurological findings at this time -Initiating SIADH work-up- (serum/urine osmolality, serum/urine sodium level)   Active Problems:   ARF (acute renal failure)  -UA positive for glucose greater than 500, otherwise a 40 nitrites, bacteria or leukocyte esterase WBC 0-5 -likely due to dehydration, but holding home medication of lisinopril, continue IV fluid resuscitation, avoid nephrotoxin, monitor closely    Hypotensive episode /with history of hypertension -Patient is on home medication of Norvasc, benazepril, also exacerbated by diarrhea hypovolemia -Holding home medication of Norvasc, benazepril -Continue with IV fluid resuscitation, BP steadily improving -We will monitor closely      Diarrhea -Unknown etiology, following with stool study including C. Difficile Afebrile, hypertensive, normal WBC -If C. difficile negative, will utilize as needed Imodium -We will add lactobacillus  Severe recurrent major depression without psychotic features (HCC) -Apparently recently started on antipsychotic medications not listed on home med list at this time  Alcohol use -Elevated serum alcohol level 167 Patient has been counseled regarding use of alcohol -Anticipate initiating CIWA evel.    Urine drug screen positive for benzodiazepine Denies any drug use   Cultures:  -Stool studies -Stool C. difficile Antimicrobial: -None  Consults called:  None -------------------------------------------------------------------------------------------------------------------------------------------- DVT prophylaxis: SCD/Compression stockings and Heparin SQ Code Status:   Code Status: Full Code   Admission status: Patient will be admitted as Inpatient, with a greater than 2 midnight length of stay. Level  of care: Stepdown   Family Communication:  none at bedside  (The above findings and plan of care has been discussed with patient in detail, the patient expressed understanding and agreement of above plan)  ----------------------------------------------------------------------------------------------------------------------------------------------  Disposition Plan:  Anticipated 1-2 days Status is: Inpatient  Remains inpatient appropriate because:Hemodynamically unstable, IV treatments appropriate due to intensity of illness or inability to take PO, and Inpatient level of care appropriate due to severity of illness  Dispo: The patient is from: Home              Anticipated d/c is to: Home              Patient currently is not medically stable to d/c.   Difficult to place patient No   -----------------------------------------------------------------------------------------------------------------------------------------------  Time spent: > than  55  Min.   SIGNED: Kendell Bane, MD, FHM. [facility],  Pager (Please use [email or web address] to page to text)  If 7PM-7AM, please contact night-coverage [email or web address],  01/25/2021, 12:36 PM

## 2021-01-25 NOTE — ED Notes (Signed)
Patient states he just feels tired and verbalizes understanding to plan of care.

## 2021-01-25 NOTE — ED Triage Notes (Signed)
Pt reports he was recently placed on antidepressant medication and ever since then he has had no appetite, diarrhea, weak, disoriented. Pt went to see his doctor today and his BP was low so they sent him to the ED due to possible dehydration.

## 2021-01-25 NOTE — ED Provider Notes (Signed)
[facility] EMERGENCY DEPARTMENT Provider Note   CSN: [id number] Arrival date & time: 01/25/21  1035     History Chief Complaint  Patient presents with   Hypotension    Eric Conley is a 60 y.o. male with PMH depression, HTN, alcohol abuse who presents to the emergency department for evaluation of hypotension and diarrhea.  Patient states that he was at his primary care physician today who found the patient to be hypotensive and in the setting of his complaints of diarrhea sent the patient to the emergency department for further evaluation.  On arrival, the patient is alert and oriented answering questions appropriately with soft blood pressures and systolics in the upper 90s.  Patient states that since starting on his antidepressant medication he has had significant diarrhea and a few episodes of vomiting that has been nonbloody nonbilious.  States his diarrhea is nonbloody and primarily watery.  Denies chest pain, shortness of breath, abdominal pain, headache, fever or other systemic symptoms.  HPI     Past Medical History:  Diagnosis Date   Depression    Hypertension     Patient Active Problem List   Diagnosis Date Noted   Hyponatremia 01/25/2021   ARF (acute renal failure) (HCC) 01/25/2021    Class: Acute   Diarrhea 01/25/2021    Class: Acute   Dehydration 01/25/2021   Hypotensive episode 01/25/2021    Class: Acute   Essential hypertension 01/25/2021   Severe recurrent major depression without psychotic features (HCC) 01/11/2021   Alcohol use disorder, severe, dependence (HCC) 01/11/2021   Suicidal thoughts     History reviewed. No pertinent surgical history.     No family history on file.  Social History   Tobacco Use   Smoking status: Every Day    Packs/day: 0.50    Types: Cigarettes   Smokeless tobacco: Never  Vaping Use   Vaping Use: Never used  Substance Use Topics   Alcohol use: Yes   Drug use: Not Currently    Home Medications Prior to  Admission medications   Medication Sig Start Date End Date Taking? Authorizing Provider  amLODipine (NORVASC) 10 MG tablet Take 10 mg by mouth daily. 01/20/21  Yes [provider]  benazepril (LOTENSIN) 20 MG tablet Take 20 mg by mouth daily. 01/20/21  Yes [provider]  amLODipine-benazepril (LOTREL) 10-20 MG capsule Take 1 capsule by mouth daily. 11/13/20   [provider]  betamethasone dipropionate 0.05 % cream Apply 1 application topically 2 (two) times daily. 10/23/20   [provider]  olmesartan (BENICAR) 40 MG tablet Take 40 mg by mouth daily. 12/15/20   [provider]    Allergies    Codeine  Review of Systems   Review of Systems  Constitutional:  Positive for fatigue. Negative for chills and fever.  HENT:  Negative for ear pain and sore throat.   Eyes:  Negative for pain and visual disturbance.  Respiratory:  Negative for cough and shortness of breath.   Cardiovascular:  Negative for chest pain and palpitations.  Gastrointestinal:  Positive for diarrhea and vomiting. Negative for abdominal pain.  Genitourinary:  Negative for dysuria and hematuria.  Musculoskeletal:  Negative for arthralgias and back pain.  Skin:  Negative for color change and rash.  Neurological:  Negative for seizures and syncope.  All other systems reviewed and are negative.  Physical Exam Updated Vital Signs BP 110/75 (BP Location: Left Arm)   Pulse 75   Temp 97.9 F (36.6 C) (  Oral)   Resp 18   Ht 5\' 9"  (1.753 m)   Wt 63.5 kg   SpO2 100%   BMI 20.67 kg/m   Physical Exam Vitals and nursing note reviewed.  Constitutional:      Appearance: He is well-developed.  HENT:     Head: Normocephalic and atraumatic.  Eyes:     Conjunctiva/sclera: Conjunctivae normal.  Cardiovascular:     Rate and Rhythm: Normal rate and regular rhythm.     Heart sounds: No murmur heard. Pulmonary:     Effort: Pulmonary effort is normal. No respiratory distress.      Breath sounds: Normal breath sounds.  Abdominal:     Palpations: Abdomen is soft.     Tenderness: There is no abdominal tenderness.  Musculoskeletal:     Cervical back: Neck supple.  Skin:    General: Skin is warm and dry.  Neurological:     Mental Status: He is alert.    ED Results / Procedures / Treatments   Labs (all labs ordered are listed, but only abnormal results are displayed) Labs Reviewed  ACETAMINOPHEN LEVEL - Abnormal; Notable for the following components:      Result Value   Acetaminophen (Tylenol), Serum <10 (*)    All other components within normal limits  SALICYLATE LEVEL - Abnormal; Notable for the following components:   Salicylate Lvl <7.0 (*)    All other components within normal limits  COMPREHENSIVE METABOLIC PANEL - Abnormal; Notable for the following components:   Sodium 111 (*)    Chloride 79 (*)    CO2 19 (*)    Glucose, Bld 114 (*)    BUN 44 (*)    Creatinine, Ser 3.27 (*)    Calcium 8.4 (*)    GFR, Estimated 21 (*)    All other components within normal limits  CBC WITH DIFFERENTIAL/PLATELET - Abnormal; Notable for the following components:   RBC 4.14 (*)    HCT 37.1 (*)    MCHC 36.4 (*)    RDW 11.2 (*)    All other components within normal limits  SODIUM - Abnormal; Notable for the following components:   Sodium 111 (*)    All other components within normal limits  GASTROINTESTINAL PANEL BY PCR, STOOL (REPLACES STOOL CULTURE)  MRSA NEXT GEN BY PCR, NASAL  LACTIC ACID, PLASMA  BRAIN NATRIURETIC PEPTIDE  NA AND K (SODIUM & POTASSIUM), RAND UR  OSMOLALITY, URINE  OSMOLALITY  HIV ANTIBODY (ROUTINE TESTING W REFLEX)  MAGNESIUM  PHOSPHORUS  BASIC METABOLIC PANEL  TROPONIN I (HIGH SENSITIVITY)  TROPONIN I (HIGH SENSITIVITY)    EKG None  Radiology No results found.  Procedures Procedures   Medications Ordered in ED Medications  lactated ringers bolus 1,000 mL (0 mLs Intravenous Hold 01/25/21 1209)  sodium chloride flush (NS) 0.9  % injection 3 mL (has no administration in time range)  0.9 %  sodium chloride infusion ( Intravenous New Bag/Given 01/25/21 1248)  sodium chloride flush (NS) 0.9 % injection 3 mL (has no administration in time range)  sodium chloride flush (NS) 0.9 % injection 3 mL (has no administration in time range)  0.9 %  sodium chloride infusion (has no administration in time range)  acetaminophen (TYLENOL) tablet 650 mg (has no administration in time range)    Or  acetaminophen (TYLENOL) suppository 650 mg (has no administration in time range)  HYDROmorphone (DILAUDID) injection 0.5-1 mg (has no administration in time range)  ondansetron (ZOFRAN) tablet 4 mg (has no  administration in time range)    Or  ondansetron (ZOFRAN) injection 4 mg (has no administration in time range)  ipratropium (ATROVENT) nebulizer solution 0.5 mg (has no administration in time range)  levalbuterol (XOPENEX) nebulizer solution 0.63 mg (has no administration in time range)  hydrALAZINE (APRESOLINE) injection 10 mg (has no administration in time range)  heparin injection 5,000 Units (5,000 Units Subcutaneous Given 01/25/21 1413)  lactobacillus (FLORANEX/LACTINEX) granules 1 g (has no administration in time range)  thiamine tablet 100 mg (100 mg Oral Given 01/25/21 1412)    Or  thiamine (B-1) injection 100 mg ( Intravenous See Alternative 01/25/21 1412)  folic acid (FOLVITE) tablet 1 mg (1 mg Oral Given 01/25/21 1413)  multivitamin with minerals tablet 1 tablet (1 tablet Oral Given 01/25/21 1412)  triamcinolone cream (KENALOG) 0.5 % (has no administration in time range)    ED Course  I have reviewed the triage vital signs and the nursing notes.  Pertinent labs & imaging results that were available during my care of the patient were reviewed by me and considered in my medical decision making (see chart for details).    MDM Rules/Calculators/A&P                           Patient seen the emergency department for evaluation  of fatigue, hypotension, diarrhea.  Physical exam is unremarkable.  Laboratory evaluation reveals a significant hyponatremia to 111, hypochloremia to 79, CO2 19, BUN elevation of 44, creatinine 3.27 which is an elevation for this patient.  CBC unremarkable, aspirin Tylenol negative, BNP negative.  Patient initially started on lactated Ringer's for his blood pressure, but this was stopped in the setting of his recently discovered hyponatremia in favor of gentle fluid rehydration in the inpatient setting.  Patient then admitted to the hospital service for management of acute kidney injury and hyponatremia. Final Clinical Impression(s) / ED Diagnoses Final diagnoses:  None    Rx / DC Orders ED Discharge Orders     None        Synia Douglass, Wyn Forster, MD 01/25/21 [phone]

## 2021-01-26 LAB — GASTROINTESTINAL PANEL BY PCR, STOOL (REPLACES STOOL CULTURE)

## 2021-01-26 LAB — BASIC METABOLIC PANEL
Anion gap: 7 (ref 5–15)
Anion gap: 7 (ref 5–15)
BUN: 39 mg/dL — ABNORMAL HIGH (ref 6–20)
BUN: 29 mg/dL — ABNORMAL HIGH (ref 6–20)
CO2: 18 mmol/L — ABNORMAL LOW (ref 22–32)
CO2: 18 mmol/L — ABNORMAL LOW (ref 22–32)
Calcium: 7.3 mg/dL — ABNORMAL LOW (ref 8.9–10.3)
Calcium: 7.9 mg/dL — ABNORMAL LOW (ref 8.9–10.3)
Chloride: 90 mmol/L — ABNORMAL LOW (ref 98–111)
Chloride: 102 mmol/L (ref 98–111)
Creatinine, Ser: 2.02 mg/dL — ABNORMAL HIGH (ref 0.61–1.24)
Creatinine, Ser: 1.31 mg/dL — ABNORMAL HIGH (ref 0.61–1.24)
GFR, Estimated: 38 mL/min — ABNORMAL LOW (ref >60)
GFR, Estimated: >60 mL/min (ref >60)
Glucose, Bld: 102 mg/dL — ABNORMAL HIGH (ref 70–99)
Glucose, Bld: 124 mg/dL — ABNORMAL HIGH (ref 70–99)
Potassium: 3.7 mmol/L (ref 3.5–5.1)
Potassium: 3.3 mmol/L — ABNORMAL LOW (ref 3.5–5.1)
Sodium: 115 mmol/L — CL (ref 135–145)
Sodium: 127 mmol/L — ABNORMAL LOW (ref 135–145)

## 2021-01-26 LAB — CBC
HCT: 34.6 % — ABNORMAL LOW (ref 39.0–52.0)
Hemoglobin: 12.3 g/dL — ABNORMAL LOW (ref 13.0–17.0)
MCH: 32.6 pg (ref 26.0–34.0)
MCHC: 35.5 g/dL (ref 30.0–36.0)
MCV: 91.8 fL (ref 80.0–100.0)
Platelets: 293 10*3/uL (ref 150–400)
RBC: 3.77 MIL/uL — ABNORMAL LOW (ref 4.22–5.81)
RDW: 11.3 % — ABNORMAL LOW (ref 11.5–15.5)
WBC: 6.6 10*3/uL (ref 4.0–10.5)
nRBC: 0 % (ref 0.0–0.2)

## 2021-01-26 LAB — PROTIME-INR
INR: 1 (ref 0.8–1.2)
Prothrombin Time: 13.2 seconds (ref 11.4–15.2)

## 2021-01-26 LAB — C DIFFICILE QUICK SCREEN W PCR REFLEX
C Diff antigen: NEGATIVE
C Diff interpretation: NOT DETECTED
C Diff toxin: NEGATIVE

## 2021-01-26 LAB — GLUCOSE, CAPILLARY: Glucose-Capillary: 109 mg/dL — ABNORMAL HIGH (ref 70–99)

## 2021-01-26 LAB — SODIUM: Sodium: 123 mmol/L — ABNORMAL LOW (ref 135–145)

## 2021-01-26 LAB — MAGNESIUM: Magnesium: 2.4 mg/dL (ref 1.7–2.4)

## 2021-01-26 MED ORDER — MIDAZOLAM HCL 2 MG/2ML IJ SOLN: 0.5000 mg | Freq: Once | INTRAMUSCULAR | Status: DC

## 2021-01-26 MED ORDER — CHLORHEXIDINE GLUCONATE CLOTH 2 % EX PADS
6.0000 | MEDICATED_PAD | Freq: Every day | CUTANEOUS | Status: DC
  Administered 2021-01-26 – 2021-01-28 (×3): 6 via TOPICAL

## 2021-01-26 MED ORDER — SODIUM PHOSPHATES 45 MMOLE/15ML IV SOLN
10.0000 mmol | Freq: Once | INTRAVENOUS | Status: AC
  Administered 2021-01-26: 10 mmol via INTRAVENOUS
  Filled 2021-01-26: qty 3.33

## 2021-01-26 MED ORDER — LORAZEPAM 1 MG PO TABS
1.0000 mg | ORAL_TABLET | Freq: Once | ORAL | Status: AC
  Administered 2021-01-26: 1 mg via ORAL
  Filled 2021-01-26: qty 1

## 2021-01-26 MED ORDER — LOPERAMIDE HCL 2 MG PO CAPS
4.0000 mg | ORAL_CAPSULE | ORAL | Status: DC | PRN
  Administered 2021-01-26: 4 mg via ORAL
  Filled 2021-01-26: qty 2

## 2021-01-26 MED ORDER — SODIUM CHLORIDE 0.9 % IV SOLN: INTRAVENOUS | Status: AC

## 2021-01-26 NOTE — Evaluation (Signed)
Physical Therapy Evaluation Only Patient Details Name: Eric Conley MRN: [id number] DOB: 1960-08-18 Today's Date: 01/26/2021   History of Present Illness  Eric Conley is a 60 y.o. male presenting for persistent diarrhea, generalized weaknesses. Pt sodium 111 in ED. PMH: HTN, depression, previous history of alcohol abuse  Clinical Impression  Pt able to complete transfers, ambulate in hallway, toilet and manage lines independently without assistance. Pt BP 95/80 and denies dizziness or lightheadedness with all mobility. Physical therapy evaluation completed, patient is at baseline and no further PT services recommended at this time. Patient discharged to care of nursing for ambulation daily as tolerated for length of stay.     Follow Up Recommendations No PT follow up    Equipment Recommendations  None recommended by PT    Recommendations for Other Services       Precautions / Restrictions Precautions Precautions: Other (comment) Precaution Comments: monitor BP Restrictions Weight Bearing Restrictions: No      Mobility  Bed Mobility  General bed mobility comments: sitting in chair upon arrival    Transfers Overall transfer level: Independent Equipment used: None     Ambulation/Gait Ambulation/Gait assistance: Independent Gait Distance (Feet): 300 Feet Assistive device: None Gait Pattern/deviations: WFL(Within Functional Limits) Gait velocity: WFL   General Gait Details: pt completes direction turns, speed changes, head turns and 180 degree turns without LOB, drifting or veering.  Stairs            Wheelchair Mobility    Modified Rankin (Stroke Patients Only)       Balance Overall balance assessment: No apparent balance deficits (not formally assessed)       Pertinent Vitals/Pain Pain Assessment: No/denies pain    Home Living Family/patient expects to be discharged to:: Private residence Living Arrangements: Spouse/significant other Available  Help at Discharge: Family;Available PRN/intermittently Type of Home: House Home Access: Stairs to enter Entrance Stairs-Rails: Doctor, general practice of Steps: 6 Home Layout: One level Home Equipment: None      Prior Function Level of Independence: Independent         Comments: Pt independent with mobility and self care, works full time in [employer], denies falls, drives.     Hand Dominance        Extremity/Trunk Assessment   Upper Extremity Assessment Upper Extremity Assessment: Overall WFL for tasks assessed    Lower Extremity Assessment Lower Extremity Assessment: Overall WFL for tasks assessed    Cervical / Trunk Assessment Cervical / Trunk Assessment: Normal  Communication   Communication: No difficulties  Cognition Arousal/Alertness: Awake/alert Behavior During Therapy: WFL for tasks assessed/performed Overall Cognitive Status: Within Functional Limits for tasks assessed     General Comments      Exercises     Assessment/Plan    PT Assessment Patent does not need any further PT services  PT Problem List         PT Treatment Interventions      PT Goals (Current goals can be found in the Care Plan section)  Acute Rehab PT Goals Patient Stated Goal: "when can I go home?" PT Goal Formulation: All assessment and education complete, DC therapy    Frequency     Barriers to discharge        Co-evaluation               AM-PAC PT "6 Clicks" Mobility  Outcome Measure Help needed turning from your back to your side while in a flat bed without using bedrails?: None  Help needed moving from lying on your back to sitting on the side of a flat bed without using bedrails?: None Help needed moving to and from a bed to a chair (including a wheelchair)?: None Help needed standing up from a chair using your arms (e.g., wheelchair or bedside chair)?: None Help needed to walk in hospital room?: None Help needed climbing 3-5 steps with a  railing? : None 6 Click Score: 24    End of Session   Activity Tolerance: Patient tolerated treatment well Patient left: in chair;with call bell/phone within reach Nurse Communication: Mobility status PT Visit Diagnosis: Difficulty in walking, not elsewhere classified (R26.2)    Time: 0600-4599 PT Time Calculation (min) (ACUTE ONLY): 24 min   Charges:   PT Evaluation $PT Eval Low Complexity: 1 Low           Tori Scottie Metayer PT, DPT 01/26/21, 9:28 AM

## 2021-01-26 NOTE — Consult Note (Addendum)
Reason for Consult:Hyponatremia Referring Physician: [name] is an 60 y.o. male with HTN, previous history of ETOH abuse.  Developed diarrhea 5 days ago and saw PCP yesterday-  was discovered to have hypotension so sent to ER. SBP as low as 67- was on BP meds as OP including ACE/ARB but stopped them on 8/19.   labs yesterday showed crt of 3.27 (was 0.64 on 01/10/21) and sodium of 111 (was 134 on 01/10/21 but had been high 120s in July.  Felt to be dry so given volume -  sodium up to 115 this AM and crt down to 2. He looks good-  walked with PT-  felt a little off still.  Of note-  had recently had his BP meds titrated up as OP for high BP-  including an ARB.  Cortisol seems fine-  urine osm is appropriately low- urine sodium less than 10-  no TSH.  Was on lexapro as OP   Trend in Creatinine: Creatinine, Ser  Date/Time Value Ref Range Status  01/26/2021 04:46 AM 2.02 (H) 0.61 - 1.24 mg/dL Final  84/13/2440 10:27 PM 2.99 (H) 0.61 - 1.24 mg/dL Final  25/36/6440 34:74 AM 3.27 (H) 0.61 - 1.24 mg/dL Final  25/95/6387 56:43 AM 0.64 0.61 - 1.24 mg/dL Final  32/95/1884 16:60 PM 1.03 0.61 - 1.24 mg/dL Final  63/06/6008 93:23 PM 0.69 0.61 - 1.24 mg/dL Final   Sodium  Date/Time Value Ref Range Status  01/26/2021 04:46 AM 115 (LL) 135 - 145 mmol/L Final    Comment:    CRITICAL RESULT CALLED TO, READ BACK BY AND VERIFIED WITH: LOOMIS,K AT 5:25AM ON 01/26/21 BY [facility]   01/25/2021 05:55 PM 115 (LL) 135 - 145 mmol/L Final    Comment:    CRITICAL RESULT CALLED TO, READ BACK BY AND VERIFIED WITH: SHELTON,A ON 01/25/21 AT 1845 BY LOY,C   01/25/2021 12:40 PM 111 (LL) 135 - 145 mmol/L Final    Comment:    CRITICAL RESULT CALLED TO, READ BACK BY AND VERIFIED WITH: WHITE,M AT 1347 ON 8.22.22 BY RUCINSKI,B Performed at [facility], [address], [place], [place] [address]   01/25/2021 11:25 AM 111 (LL) 135 - 145 mmol/L Final    Comment:    CRITICAL RESULT CALLED TO, READ BACK BY AND  VERIFIED WITH: OAKLEY,B AT 12:05PM ON 01/25/21 BY [facility]   01/10/2021 09:42 AM 135 135 - 145 mmol/L Final  12/11/2020 12:38 PM 128 (L) 135 - 145 mmol/L Final  12/06/2019 06:19 PM 128 (L) 135 - 145 mmol/L Final   PMH:   Past Medical History:  Diagnosis Date   Depression    Hypertension     PSH:  History reviewed. No pertinent surgical history.  Allergies:  Allergies  Allergen Reactions   Codeine     Medications:   Prior to Admission medications   Medication Sig Start Date End Date Taking? Authorizing Provider  amLODipine (NORVASC) 10 MG tablet Take 10 mg by mouth daily. 01/20/21  Yes [provider]  benazepril (LOTENSIN) 20 MG tablet Take 20 mg by mouth daily. 01/20/21  Yes [provider]  amLODipine-benazepril (LOTREL) 10-20 MG capsule Take 1 capsule by mouth daily. 11/13/20   [provider]  betamethasone dipropionate 0.05 % cream Apply 1 application topically 2 (two) times daily. 10/23/20   [provider]  olmesartan (BENICAR) 40 MG tablet Take 40 mg by mouth daily. 12/15/20   [provider]    Discontinued Meds:  Medications Discontinued During This Encounter  Medication Reason   ALPRAZolam (XANAX) 1 MG tablet Patient Preference   betamethasone dipropionate 0.05 % cream 1 application Inpatient Standard   LORazepam (ATIVAN) tablet 1-4 mg    LORazepam (ATIVAN) injection 1-4 mg    hydrALAZINE (APRESOLINE) injection 10 mg     Social History:  reports that he has been smoking cigarettes. He has been smoking an average of .5 packs per day. He has never used smokeless tobacco. He reports current alcohol use. He reports that he does not currently use drugs.  Family History:  No family history on file.  A comprehensive review of systems was negative except for: Neurological: positive for dizziness and weakness  Blood pressure 95/80, pulse 96, temperature 99.2 F (37.3 C), temperature source Oral, resp. rate (!) 26, height  5\' 9"  (1.753 m), weight 63.6 kg, SpO2 97 %. General appearance: alert and cooperative Resp: clear to auscultation bilaterally Cardio: regular rate and rhythm, S1, S2 normal, no murmur, click, rub or gallop GI: soft, non-tender; bowel sounds normal; no masses,  no organomegaly Extremities: extremities normal, atraumatic, no cyanosis or edema  Labs: Basic Metabolic Panel: Recent Labs  Lab 01/25/21 1125 01/25/21 1240 01/25/21 1755 01/26/21 0446  NA 111* 111* 115* 115*  K 3.6  --  3.4* 3.7  CL 79*  --  83* 90*  CO2 19*  --  19* 18*  GLUCOSE 114*  --  121* 102*  BUN 44*  --  44* 39*  CREATININE 3.27*  --  2.99* 2.02*  ALBUMIN 4.1  --   --   --   CALCIUM 8.4*  --  8.2* 7.3*  PHOS  --  5.6*  --   --    Liver Function Tests: Recent Labs  Lab 01/25/21 1125  AST 19  ALT 20  ALKPHOS 58  BILITOT 0.5  PROT 6.8  ALBUMIN 4.1   No results for input(s): LIPASE, AMYLASE in the last 168 hours. No results for input(s): AMMONIA in the last 168 hours. CBC: Recent Labs  Lab 01/25/21 1125 01/26/21 0446  WBC 9.6 6.6  NEUTROABS 7.6  --   HGB 13.5 12.3*  HCT 37.1* 34.6*  MCV 89.6 91.8  PLT 339 293   PT/INR: @labrcntip (inr:5) Cardiac Enzymes: No results for input(s): CKTOTAL, CKMB, CKMBINDEX, TROPONINI in the last 168 hours. CBG: Recent Labs  Lab 01/26/21 0724  GLUCAP 109*    Iron Studies: No results for input(s): IRON, TIBC, TRANSFERRIN, FERRITIN in the last 168 hours.  Xrays/Other Studies: No results found.   Assessment/Plan:  59 year old WM who presents with hypotension in the setting of several days of diarrhea and BP meds-  found to have AKI and acute hyponatremia 1. Hyponatremia-  this all seems to be consistent with volume depletion given low urine sodium and appropriately depressed urine osm.  In addition,  it has improved with hydration with NS-  will inc rate of NS and continue to follow.  Even though the level of sodium is concerning because it is low, he does not  seem to be symptomatic so does not require more aggressive treatment like 3% saline.  I will check TSH to complete the work up-   his cortisol seems OK.  Would be Ok to resume his lexapro when needed as this does not seem to be consistent with SIADH.  2. Diarrhea-  work up in progress  3. AKI-  also seems consistent with volume depletion-  has improved with IVF  Eric Conley 01/26/2021, 10:06 AM

## 2021-01-26 NOTE — Progress Notes (Signed)
PROGRESS NOTE    Patient: Eric Conley                            PCP: Samuella Bruin                    DOB: 1960-08-23            DOA: 01/25/2021 [id number]             DOS: 01/26/2021, 10:29 AM   LOS: 1 day   Date of Service: The patient was seen and examined on 01/26/2021  Subjective:   The patient was seen and examined this morning. Stable at this time. Still complaining of generalized weakness, mildly hypotensive.  Otherwise no issues overnight.   Brief Narrative:   Eric Conley is a 60 y.o. male with medical history significant of   hypertension, depression, previous history of alcohol abuse..  Presenting for persistent diarrhea, generalized weaknesses. Was seen by PCP today was sent to the ED for hypotension, generalized weakness and diarrhea    Assessment & Plan:   Principal Problem:   Hyponatremia -Likely exacerbated by persistent diarrhea, hypovolemia, -Continue with IV fluids, normal saline 150 mL/h -Current sodium level 111 >>>115  -We will correct serum sodium level slowly... No focal neurological findings at this time -Initiating SIADH work-up- (serum/urine osmolality, serum/urine sodium level)  -Serum sodium 111, serum osmolality 25 (low), urine osmolality 230 (low), urine potassium 17, urine sodium less than 10   Active Problems:   ARF (acute renal failure)  -Creatinine 3.27 >> 2.99 >>> 2.02 today  -GFR 21, 24, 38 -UA positive for glucose greater than 500, otherwise a 40 nitrites, bacteria or leukocyte esterase WBC 0-5 -likely due to dehydration, but holding home medication of lisinopril, continue IV fluid resuscitation, avoid nephrotoxin, monitor closely     Hypotensive episode /with history of hypertension -Asymptomatic-but still hypotensive -Patient is on home medication of Norvasc, benazepril, also exacerbated by diarrhea hypovolemia -Holding home medication of Norvasc, benazepril -Continue with IV fluid resuscitation, BP steadily  improving -Monitoring closely     Diarrhea -Improving -Unknown etiology, following with stool study including C. Difficile Afebrile, hypertensive, normal WBC -If C. difficile negative, will utilize as needed Imodium -We will add lactobacillus     Severe recurrent major depression without psychotic features (HCC) -Apparently recently started on antipsychotic medications not listed on home med list at this time   Alcohol use -Elevated serum alcohol level 167 Patient has been counseled regarding use of alcohol -Anticipate initiating CIWA evel.     Urine drug screen positive for benzodiazepine Denies any drug use     Cultures:  -Stool studies  Antimicrobial: -None   Consults called:  None --------------------------------------------------------------------------------------------------------------------------------- DVT prophylaxis: SCD/Compression stockings and Heparin SQ Code Status:   Code Status: Full Code     Admission status: Patient will be admitted as Inpatient, with a greater than 2 midnight length of stay. Level of care: Stepdown     Family Communication:  none at bedside  (The above findings and plan of care has been discussed with patient in detail, the patient expressed understanding and agreement of above plan)  ----------------------------------------------------------------------------------------------------------------------------------   Disposition Plan:  Anticipated 1-2 days Status is: Inpatient   Remains inpatient appropriate because:Hemodynamically unstable, IV treatments appropriate due to intensity of illness or inability to take PO, and Inpatient level of care appropriate due to severity of illness   Dispo: The patient  is from: Home              Anticipated d/c is to: Home              Patient currently is not medically stable to d/c.              Difficult to place patient No  Level of care: Stepdown   Procedures:   No admission  procedures for hospital encounter.    Antimicrobials:  Anti-infectives (From admission, onward)    None        Medication:   Chlorhexidine Gluconate Cloth  6 each Topical Daily   folic acid  1 mg Oral Daily   heparin  5,000 Units Subcutaneous Q8H   lactobacillus  1 g Oral TID WC   multivitamin with minerals  1 tablet Oral Daily   sodium chloride flush  3 mL Intravenous Q12H   sodium chloride flush  3 mL Intravenous Q12H   thiamine  100 mg Oral Daily   Or   thiamine  100 mg Intravenous Daily   triamcinolone cream   Topical BID    sodium chloride, acetaminophen **OR** acetaminophen, HYDROmorphone (DILAUDID) injection, ipratropium, levalbuterol, ondansetron **OR** ondansetron (ZOFRAN) IV, sodium chloride flush   Objective:   Vitals:   01/26/21 0400 01/26/21 0422 01/26/21 0722 01/26/21 0858  BP:    95/80  Pulse:    96  Resp:   (!) 22 (!) 26  Temp: 98.6 F (37 C)  99.2 F (37.3 C)   TempSrc: Oral  Oral   SpO2:    97%  Weight:  63.6 kg    Height:        Intake/Output Summary (Last 24 hours) at 01/26/2021 1029 Last data filed at 01/26/2021 0845 Gross per 24 hour  Intake 288.42 ml  Output 800 ml  Net -511.58 ml   Filed Weights   01/25/21 1052 01/25/21 1400 01/26/21 0422  Weight: 63.5 kg 63.5 kg 63.6 kg     Examination:   Physical Exam  Constitution:  Alert, cooperative, no distress,  Appears calm and comfortable  Psychiatric: Normal and stable mood and affect, cognition intact,   HEENT: Normocephalic, PERRL, otherwise with in Normal limits  Chest:Chest symmetric Cardio vascular:  S1/S2, RRR, No murmure, No Rubs or Gallops  pulmonary: Clear to auscultation bilaterally, respirations unlabored, negative wheezes / crackles Abdomen: Soft, non-tender, non-distended, bowel sounds,no masses, no organomegaly Muscular skeletal: Limited exam - in bed, able to move all 4 extremities, Normal strength,  Neuro: CNII-XII intact. , normal motor and sensation, reflexes  intact  Extremities: No pitting edema lower extremities, +2 pulses  Skin: Dry, warm to touch, negative for any Rashes, No open wounds Wounds: per nursing documentation    ------------------------------------------------------------------------------------------------------------------------------------------    LABs:  CBC Latest Ref Rng & Units 01/26/2021 01/25/2021 01/10/2021  WBC 4.0 - 10.5 K/uL 6.6 9.6 3.3(L)  Hemoglobin 13.0 - 17.0 g/dL 12.3(L) 13.5 14.0  Hematocrit 39.0 - 52.0 % 34.6(L) 37.1(L) 40.7  Platelets 150 - 400 K/uL 293 339 228   CMP Latest Ref Rng & Units 01/26/2021 01/25/2021 01/25/2021  Glucose 70 - 99 mg/dL 390(Z) 009(Q) -  BUN 6 - 20 mg/dL 33(A) 07(M) -  Creatinine 0.61 - 1.24 mg/dL 2.26(J) 3.35(K) -  Sodium 135 - 145 mmol/L 115(LL) 115(LL) 111(LL)  Potassium 3.5 - 5.1 mmol/L 3.7 3.4(L) -  Chloride 98 - 111 mmol/L 90(L) 83(L) -  CO2 22 - 32 mmol/L 18(L) 19(L) -  Calcium 8.9 - 10.3  mg/dL 7.3(L) 8.2(L) -  Total Protein 6.5 - 8.1 g/dL - - -  Total Bilirubin 0.3 - 1.2 mg/dL - - -  Alkaline Phos 38 - 126 U/L - - -  AST 15 - 41 U/L - - -  ALT 0 - 44 U/L - - -       Micro Results Recent Results (from the past 240 hour(s))  MRSA Next Gen by PCR, Nasal     Status: None   Collection Time: 01/25/21  2:49 PM   Specimen: Nasal Mucosa; Nasal Swab  Result Value Ref Range Status   MRSA by PCR Next Gen NOT DETECTED NOT DETECTED Final    Comment: (NOTE) The GeneXpert MRSA Assay (FDA approved for NASAL specimens only), is one component of a comprehensive MRSA colonization surveillance program. It is not intended to diagnose MRSA infection nor to guide or monitor treatment for MRSA infections. Test performance is not FDA approved in patients less than 57 years old. Performed at [facility], [address], [place], [place] [address]     Radiology Reports No results found.  SIGNED: Kendell Bane, MD, FHM. [facility],  Pager (please use [email or web address] to  page/text) Please use Epic Secure Chat for non-urgent communication (7AM-7PM)  If 7PM-7AM, please contact night-coverage [email or web address], 01/26/2021, 10:29 AM

## 2021-01-26 NOTE — Progress Notes (Signed)
OT Cancellation Note  Patient Details Name: Eric Conley MRN: [id number] DOB: 10-29-1960   Cancelled Treatment:    Reason Eval/Treat Not Completed: OT screened, no needs identified, will sign off. OT spoke to evaluating PT prior to session. Pt reportedly ambulated in hall without difficulty. Upon entrance to room pt was able to stand and opening a container without difficulty. Pt reported being near baseline levels for function. Pt screened and determined not to need OT services.   Anabelle Bungert OT, MOT   Danie Chandler 01/26/2021, 9:52 AM

## 2021-01-27 DIAGNOSIS — R197 Diarrhea, unspecified: Secondary | ICD-10-CM

## 2021-01-27 DIAGNOSIS — I10 Essential (primary) hypertension: Secondary | ICD-10-CM

## 2021-01-27 DIAGNOSIS — N179 Acute kidney failure, unspecified: Principal | ICD-10-CM

## 2021-01-27 LAB — CBC
HCT: 33.2 % — ABNORMAL LOW (ref 39.0–52.0)
Hemoglobin: 11.4 g/dL — ABNORMAL LOW (ref 13.0–17.0)
MCH: 32.6 pg (ref 26.0–34.0)
MCHC: 34.3 g/dL (ref 30.0–36.0)
MCV: 94.9 fL (ref 80.0–100.0)
Platelets: 261 10*3/uL (ref 150–400)
RBC: 3.5 MIL/uL — ABNORMAL LOW (ref 4.22–5.81)
RDW: 11.7 % (ref 11.5–15.5)
WBC: 3.8 10*3/uL — ABNORMAL LOW (ref 4.0–10.5)
nRBC: 0 % (ref 0.0–0.2)

## 2021-01-27 LAB — BASIC METABOLIC PANEL
Anion gap: 4 — ABNORMAL LOW (ref 5–15)
BUN: 19 mg/dL (ref 6–20)
CO2: 18 mmol/L — ABNORMAL LOW (ref 22–32)
Calcium: 8.1 mg/dL — ABNORMAL LOW (ref 8.9–10.3)
Chloride: 111 mmol/L (ref 98–111)
Creatinine, Ser: 0.94 mg/dL (ref 0.61–1.24)
GFR, Estimated: >60 mL/min (ref >60)
Glucose, Bld: 101 mg/dL — ABNORMAL HIGH (ref 70–99)
Potassium: 4.2 mmol/L (ref 3.5–5.1)
Sodium: 133 mmol/L — ABNORMAL LOW (ref 135–145)

## 2021-01-27 LAB — GLUCOSE, CAPILLARY: Glucose-Capillary: 99 mg/dL (ref 70–99)

## 2021-01-27 LAB — TSH: TSH: 1.09 u[IU]/mL (ref 0.350–4.500)

## 2021-01-27 MED ORDER — MELATONIN 3 MG PO TABS
6.0000 mg | ORAL_TABLET | Freq: Once | ORAL | Status: AC
  Administered 2021-01-27: 6 mg via ORAL
  Filled 2021-01-27: qty 2

## 2021-01-27 MED ORDER — SODIUM CHLORIDE 0.9 % IV SOLN: INTRAVENOUS | Status: DC

## 2021-01-27 NOTE — Progress Notes (Signed)
PROGRESS NOTE  Eric Conley YKZ:[id number] DOB: 1961-01-04 DOA: 01/25/2021 PCP: Shawnie Dapper, PA-C  Brief History:  60 y.o. male with medical history significant of   hypertension, depression, previous history of alcohol abuse..  Presenting for persistent diarrhea, generalized weaknesses. Was seen by PCP on 01/25/21  and he was sent to the ED for hypotension, generalized weakness and diarrhea.  Patient was noted to have Na 111.  Despite isotonic saline, his serum Na remain low.  As a result, renal was consulted.  They increased the patient's IVF with improvement of his serum Na.      Assessment/Plan: Hyponatremia -Likely exacerbated by persistent diarrhea, hypovolemia, -Continue with IV fluids, normal saline 150 mL/h>>200cc/hr -Current sodium level 111 >>>115>>133 -correct serum sodium level slowly... No focal neurological findings at this time -urine and serum Osm not consistent with SIADH  -Serum sodium 111, serum osmolality 25 (low), urine osmolality 230 (low), urine potassium 17, urine sodium less than 10  AKI -Creatinine peaked 3.27  -improving with IVF -UA positive for glucose greater than 500, otherwise a 40 nitrites, bacteria or leukocyte esterase WBC 0-5 -likely due to dehydration and hemodynamic changes   Hypotensive episode /with history of hypertension -Asymptomatic-gradually improved with IVF -Patient is on home medication of Norvasc, benazepril--on hold -Continue with IV fluid resuscitation, BP steadily improving -Monitoring closely     Diarrhea -Improving -Unknown etiology -stool pathogen panel neg -Cdiff neg -If C. difficile negative, will utilize as needed Imodium -added lactobacillus    Severe recurrent major depression without psychotic features (HCC) -Apparently recently started on antipsychotic medications not listed on home med list at this time   Alcohol use -Elevated serum alcohol level 167 Patient has been counseled regarding use of  alcohol -No signs of withdrawal currently      Status is: Inpatient  Remains inpatient appropriate because:Inpatient level of care appropriate due to severity of illness  Dispo: The patient is from: Home              Anticipated d/c is to: Home              Patient currently is not medically stable to d/c.   Difficult to place patient No        Family Communication:   no Family at bedside  Consultants:  renal  Code Status:  FULL   DVT Prophylaxis:  [place] Heparin    Procedures: As Listed in Progress Note Above  Antibiotics: None       Subjective: Patient feels intermittent dizziness with getting up.  Denies f/c, cp, sob, n/v/.  Stools firming up  Objective: Vitals:   01/27/21 0500 01/27/21 0700 01/27/21 1100 01/27/21 1500  BP:  93/61  (!) 91/49  Pulse:   63   Resp:      Temp:  97.6 F (36.4 C) 97.6 F (36.4 C)   TempSrc:  Oral Oral   SpO2:   100%   Weight: 63.6 kg     Height:        Intake/Output Summary (Last 24 hours) at 01/27/2021 1623 Last data filed at 01/27/2021 1200 Gross per 24 hour  Intake 4406.66 ml  Output 450 ml  Net 3956.66 ml   Weight change: 0.096 kg Exam:  General:  Pt is alert, follows commands appropriately, not in acute distress HEENT: No icterus, No thrush, No neck mass, [place]/AT Cardiovascular: RRR, S1/S2, no rubs, no gallops Respiratory: bibasilar rales. No wheeze Abdomen:  Soft/+BS, non tender, non distended, no guarding Extremities: No edema, No lymphangitis, No petechiae, No rashes, no synovitis   Data Reviewed: I have personally reviewed following labs and imaging studies Basic Metabolic Panel: Recent Labs  Lab 01/25/21 1125 01/25/21 1240 01/25/21 1755 01/26/21 0446 01/26/21 1604 01/26/21 1814 01/27/21 0507  NA 111* 111* 115* 115* 123* 127* 133*  K 3.6  --  3.4* 3.7  --  3.3* 4.2  CL 79*  --  83* 90*  --  102 111  CO2 19*  --  19* 18*  --  18* 18*  GLUCOSE 114*  --  121* 102*  --  124* 101*  BUN 44*  --   44* 39*  --  29* 19  CREATININE 3.27*  --  2.99* 2.02*  --  1.31* 0.94  CALCIUM 8.4*  --  8.2* 7.3*  --  7.9* 8.1*  MG  --  1.5*  --  2.4  --   --   --   PHOS  --  5.6*  --   --   --   --   --    Liver Function Tests: Recent Labs  Lab 01/25/21 1125  AST 19  ALT 20  ALKPHOS 58  BILITOT 0.5  PROT 6.8  ALBUMIN 4.1   No results for input(s): LIPASE, AMYLASE in the last 168 hours. No results for input(s): AMMONIA in the last 168 hours. Coagulation Profile: Recent Labs  Lab 01/26/21 0446  INR 1.0   CBC: Recent Labs  Lab 01/25/21 1125 01/26/21 0446 01/27/21 0507  WBC 9.6 6.6 3.8*  NEUTROABS 7.6  --   --   HGB 13.5 12.3* 11.4*  HCT 37.1* 34.6* 33.2*  MCV 89.6 91.8 94.9  PLT 339 293 261   Cardiac Enzymes: No results for input(s): CKTOTAL, CKMB, CKMBINDEX, TROPONINI in the last 168 hours. BNP: Invalid input(s): POCBNP CBG: Recent Labs  Lab 01/26/21 0724 01/27/21 0756  GLUCAP 109* 99   HbA1C: No results for input(s): HGBA1C in the last 72 hours. Urine analysis:    Component Value Date/Time   COLORURINE YELLOW 01/12/2021 0950   APPEARANCEUR CLEAR 01/12/2021 0950   LABSPEC 1.009 01/12/2021 0950   PHURINE 6.0 01/12/2021 0950   GLUCOSEU >=500 (A) 01/12/2021 0950   HGBUR NEGATIVE 01/12/2021 0950   BILIRUBINUR NEGATIVE 01/12/2021 0950   KETONESUR NEGATIVE 01/12/2021 0950   PROTEINUR NEGATIVE 01/12/2021 0950   NITRITE NEGATIVE 01/12/2021 0950   LEUKOCYTESUR NEGATIVE 01/12/2021 0950   Sepsis Labs: @LABRCNTIP (procalcitonin:4,lacticidven:4) ) Recent Results (from the past 240 hour(s))  MRSA Next Gen by PCR, Nasal     Status: None   Collection Time: 01/25/21  2:49 PM   Specimen: Nasal Mucosa; Nasal Swab  Result Value Ref Range Status   MRSA by PCR Next Gen NOT DETECTED NOT DETECTED Final    Comment: (NOTE) The GeneXpert MRSA Assay (FDA approved for NASAL specimens only), is one component of a comprehensive MRSA colonization surveillance program. It is not  intended to diagnose MRSA infection nor to guide or monitor treatment for MRSA infections. Test performance is not FDA approved in patients less than 67 years old. Performed at [facility], [address] [place], [place]   Gastrointestinal Panel by PCR , Stool     Status: None   Collection Time: 01/25/21  5:24 PM   Specimen: Stool  Result Value Ref Range Status   Campylobacter species NOT DETECTED NOT DETECTED Final   Plesimonas shigelloides NOT DETECTED NOT DETECTED  Final   Salmonella species NOT DETECTED NOT DETECTED Final   Yersinia enterocolitica NOT DETECTED NOT DETECTED Final   Vibrio species NOT DETECTED NOT DETECTED Final   Vibrio cholerae NOT DETECTED NOT DETECTED Final   Enteroaggregative E coli (EAEC) NOT DETECTED NOT DETECTED Final   Enteropathogenic E coli (EPEC) NOT DETECTED NOT DETECTED Final   Enterotoxigenic E coli (ETEC) NOT DETECTED NOT DETECTED Final   Shiga like toxin producing E coli (STEC) NOT DETECTED NOT DETECTED Final   Shigella/Enteroinvasive E coli (EIEC) NOT DETECTED NOT DETECTED Final   Cryptosporidium NOT DETECTED NOT DETECTED Final   Cyclospora cayetanensis NOT DETECTED NOT DETECTED Final   Entamoeba histolytica NOT DETECTED NOT DETECTED Final   Giardia lamblia NOT DETECTED NOT DETECTED Final   Adenovirus F40/41 NOT DETECTED NOT DETECTED Final   Astrovirus NOT DETECTED NOT DETECTED Final   Norovirus GI/GII NOT DETECTED NOT DETECTED Final   Rotavirus A NOT DETECTED NOT DETECTED Final   Sapovirus (I, II, IV, and V) NOT DETECTED NOT DETECTED Final    Comment: Performed at [facility], [address] [place], [place] [address]  C Difficile Quick Screen w PCR reflex     Status: None   Collection Time: 01/26/21  1:38 PM   Specimen: STOOL  Result Value Ref Range Status   C Diff antigen NEGATIVE NEGATIVE Final   C Diff toxin NEGATIVE NEGATIVE Final   C Diff interpretation No C. difficile detected.  Final    Comment: Performed at  [facility], [address], [place], [place] [address]     Scheduled Meds:  Chlorhexidine Gluconate Cloth  6 each Topical Daily   folic acid  1 mg Oral Daily   heparin  5,000 Units Subcutaneous Q8H   lactobacillus  1 g Oral TID WC   multivitamin with minerals  1 tablet Oral Daily   sodium chloride flush  3 mL Intravenous Q12H   sodium chloride flush  3 mL Intravenous Q12H   thiamine  100 mg Oral Daily   Or   thiamine  100 mg Intravenous Daily   triamcinolone cream   Topical BID   Continuous Infusions:  sodium chloride     lactated ringers Stopped (01/25/21 1209)    Procedures/Studies: No results found.  Catarina Hartshorn, DO  [facility]  If 7PM-7AM, please contact night-coverage [email or web address] Password [facility] 01/27/2021, 4:23 PM   LOS: 2 days

## 2021-01-27 NOTE — Progress Notes (Signed)
Patient ID: Eric Conley, male   DOB: 02/09/1961, 60 y.o.   MRN: [id number] S: Still having some diarrhea this morning and still feels a little "woozy". O:BP 93/61   Pulse 66   Temp 97.6 F (36.4 C) (Oral)   Resp 13   Ht 5\' 9"  (1.753 m)   Wt 63.6 kg   SpO2 98%   BMI 20.71 kg/m   Intake/Output Summary (Last 24 hours) at 01/27/2021 1040 Last data filed at 01/27/2021 01/29/2021 Gross per 24 hour  Intake 4948.54 ml  Output 450 ml  Net 4498.54 ml   Intake/Output: I/O last 3 completed shifts: In: 4951.5 [I.V.:4714.2; IV Piggyback:237.3] Out: 850 [Urine:850]  Intake/Output this shift:  No intake/output data recorded. Weight change: 0.096 kg Gen:NAD CVS: RRR Resp: CTA Abd: +BS, soft, NT/ND Ext: no edema  Recent Labs  Lab 01/25/21 1125 01/25/21 1240 01/25/21 1755 01/26/21 0446 01/26/21 1604 01/26/21 1814 01/27/21 0507  NA 111* 111* 115* 115* 123* 127* 133*  K 3.6  --  3.4* 3.7  --  3.3* 4.2  CL 79*  --  83* 90*  --  102 111  CO2 19*  --  19* 18*  --  18* 18*  GLUCOSE 114*  --  121* 102*  --  124* 101*  BUN 44*  --  44* 39*  --  29* 19  CREATININE 3.27*  --  2.99* 2.02*  --  1.31* 0.94  ALBUMIN 4.1  --   --   --   --   --   --   CALCIUM 8.4*  --  8.2* 7.3*  --  7.9* 8.1*  PHOS  --  5.6*  --   --   --   --   --   AST 19  --   --   --   --   --   --   ALT 20  --   --   --   --   --   --    Liver Function Tests: Recent Labs  Lab 01/25/21 1125  AST 19  ALT 20  ALKPHOS 58  BILITOT 0.5  PROT 6.8  ALBUMIN 4.1   No results for input(s): LIPASE, AMYLASE in the last 168 hours. No results for input(s): AMMONIA in the last 168 hours. CBC: Recent Labs  Lab 01/25/21 1125 01/26/21 0446 01/27/21 0507  WBC 9.6 6.6 3.8*  NEUTROABS 7.6  --   --   HGB 13.5 12.3* 11.4*  HCT 37.1* 34.6* 33.2*  MCV 89.6 91.8 94.9  PLT 339 293 261   Cardiac Enzymes: No results for input(s): CKTOTAL, CKMB, CKMBINDEX, TROPONINI in the last 168 hours. CBG: Recent Labs  Lab 01/26/21 0724   GLUCAP 109*    Iron Studies: No results for input(s): IRON, TIBC, TRANSFERRIN, FERRITIN in the last 72 hours. Studies/Results: No results found.  Chlorhexidine Gluconate Cloth  6 each Topical Daily   folic acid  1 mg Oral Daily   heparin  5,000 Units Subcutaneous Q8H   lactobacillus  1 g Oral TID WC   multivitamin with minerals  1 tablet Oral Daily   sodium chloride flush  3 mL Intravenous Q12H   sodium chloride flush  3 mL Intravenous Q12H   thiamine  100 mg Oral Daily   Or   thiamine  100 mg Intravenous Daily   triamcinolone cream   Topical BID    BMET    Component Value Date/Time   NA 133 (L) 01/27/2021 0507  K 4.2 01/27/2021 0507   CL 111 01/27/2021 0507   CO2 18 (L) 01/27/2021 0507   GLUCOSE 101 (H) 01/27/2021 0507   BUN 19 01/27/2021 0507   CREATININE 0.94 01/27/2021 0507   CALCIUM 8.1 (L) 01/27/2021 0507   GFRNONAA >60 01/27/2021 0507   GFRAA >60 12/06/2019 1819   CBC    Component Value Date/Time   WBC 3.8 (L) 01/27/2021 0507   RBC 3.50 (L) 01/27/2021 0507   HGB 11.4 (L) 01/27/2021 0507   HCT 33.2 (L) 01/27/2021 0507   PLT 261 01/27/2021 0507   MCV 94.9 01/27/2021 0507   MCH 32.6 01/27/2021 0507   MCHC 34.3 01/27/2021 0507   RDW 11.7 01/27/2021 0507   LYMPHSABS 0.8 01/25/2021 1125   MONOABS 1.0 01/25/2021 1125   EOSABS 0.0 01/25/2021 1125   BASOSABS 0.0 01/25/2021 1125     Assessment/Plan:  AKI- due to severe hypovolemia with rapid improvement following IVF's.  Nothing further to add and will sign off.  Please call with questions or concerns.  Severe hyponatremia, hypovolemic- had low UNa, low Uosm, and has steadily improved with IVF's and now normalized.  Remains asymptomatic.  Would decrease IVF's and allow him to drink water. Diarrhea - ongoing.  Workup per primary service.    Irena Cords, MD [employer] [phone]

## 2021-01-28 LAB — GLUCOSE, CAPILLARY: Glucose-Capillary: 93 mg/dL (ref 70–99)

## 2021-01-28 LAB — CBC
HCT: 31.3 % — ABNORMAL LOW (ref 39.0–52.0)
Hemoglobin: 10.9 g/dL — ABNORMAL LOW (ref 13.0–17.0)
MCH: 33.3 pg (ref 26.0–34.0)
MCHC: 34.8 g/dL (ref 30.0–36.0)
MCV: 95.7 fL (ref 80.0–100.0)
Platelets: 268 10*3/uL (ref 150–400)
RBC: 3.27 MIL/uL — ABNORMAL LOW (ref 4.22–5.81)
RDW: 11.9 % (ref 11.5–15.5)
WBC: 4.2 10*3/uL (ref 4.0–10.5)
nRBC: 0 % (ref 0.0–0.2)

## 2021-01-28 LAB — BASIC METABOLIC PANEL
Anion gap: 6 (ref 5–15)
BUN: 10 mg/dL (ref 6–20)
CO2: 20 mmol/L — ABNORMAL LOW (ref 22–32)
Calcium: 8.2 mg/dL — ABNORMAL LOW (ref 8.9–10.3)
Chloride: 105 mmol/L (ref 98–111)
Creatinine, Ser: 0.71 mg/dL (ref 0.61–1.24)
GFR, Estimated: >60 mL/min (ref >60)
Glucose, Bld: 98 mg/dL (ref 70–99)
Potassium: 3.3 mmol/L — ABNORMAL LOW (ref 3.5–5.1)
Sodium: 131 mmol/L — ABNORMAL LOW (ref 135–145)

## 2021-01-28 MED ORDER — POTASSIUM CHLORIDE CRYS ER 20 MEQ PO TBCR: 20.0000 meq | EXTENDED_RELEASE_TABLET | Freq: Every day | ORAL | Status: DC

## 2021-01-28 MED ORDER — POTASSIUM CHLORIDE CRYS ER 20 MEQ PO TBCR
40.0000 meq | EXTENDED_RELEASE_TABLET | Freq: Once | ORAL | Status: AC
  Administered 2021-01-28: 40 meq via ORAL
  Filled 2021-01-28: qty 2

## 2021-01-28 MED ORDER — POTASSIUM CHLORIDE CRYS ER 20 MEQ PO TBCR: 20.0000 meq | EXTENDED_RELEASE_TABLET | Freq: Every day | ORAL | 0 refills | Status: DC

## 2021-01-28 MED ORDER — QUETIAPINE FUMARATE 25 MG PO TABS: 25.0000 mg | ORAL_TABLET | Freq: Every day | ORAL | Status: DC

## 2021-01-28 MED ORDER — ALPRAZOLAM 0.5 MG PO TABS
1.0000 mg | ORAL_TABLET | Freq: Once | ORAL | Status: AC
  Administered 2021-01-28: 1 mg via ORAL
  Filled 2021-01-28: qty 2

## 2021-01-28 NOTE — Discharge Summary (Signed)
Physician Discharge Summary  Eric Conley:[id number] DOB: 1960-06-18 DOA: 01/25/2021  PCP: Shawnie Dapper, PA-C  Admit date: 01/25/2021 Discharge date: 01/28/2021  Admitted From: Home Disposition:  Home   Recommendations for Outpatient Follow-up:  Follow up with PCP in 1-2 weeks Please obtain BMP/CBC in one week     Discharge Condition: Stable CODE STATUS:FULL Diet recommendation: Heart Healthy   Brief/Interim Summary: 60 y.o. male with medical history significant of   hypertension, depression, previous history of alcohol abuse..  Presenting for persistent diarrhea, generalized weaknesses. Was seen by PCP on 01/25/21  and he was sent to the ED for hypotension, generalized weakness and diarrhea.  Patient was noted to have Na 111.  Despite isotonic saline, his serum Na remain low.  As a result, renal was consulted.  They increased the patient's IVF with improvement of his serum Na.    Discharge Diagnoses:  Hyponatremia -Likely exacerbated by persistent diarrhea, hypovolemia, -Continue with IV fluids, normal saline 150 mL/h>>200cc/hr -Current sodium level 111 >>>115>>133 -correct serum sodium level slowly... No focal neurological findings at this time -urine and serum Osm not consistent with SIADH  -Serum sodium 111, serum osmolality 25 (low), urine osmolality 230 (low), urine potassium 17, urine sodium less than 10 -Na 131 on day of d/c with IVF essentially off   AKI -Creatinine peaked 3.27  -improving with IVF -UA positive for glucose greater than 500, otherwise a 40 nitrites, bacteria or leukocyte esterase WBC 0-5 -likely due to dehydration and hemodynamic changes -serum creatinine 0.71 on day of d/c   Hypotensive episode /with history of hypertension -Asymptomatic-gradually improved with IVF -Patient is on home medication of Norvasc, benazepril--on hold -Continue with IV fluid resuscitation, BP steadily improving -Monitoring closely -pt states his olmesartan,  benazepril, amlodipine were recently d/ced by his PCP -BP stable, normotensive at time of d/c     Diarrhea -overall Improving with decrease volume and increase consistency -Unknown etiology -stool pathogen panel neg -Cdiff neg -continue as needed Imodium -added lactobacillus    Severe recurrent major depression without psychotic features (HCC) -Apparently recently started on antipsychotic medications not listed on home med list at this time   Alcohol use -Elevated serum alcohol level 167 Patient has been counseled regarding use of alcohol -No signs of withdrawal currently     Discharge Instructions   Allergies as of 01/28/2021       Reactions   Codeine         Medication List     STOP taking these medications    amLODipine 10 MG tablet Commonly known as: NORVASC   benazepril 20 MG tablet Commonly known as: LOTENSIN   olmesartan 40 MG tablet Commonly known as: BENICAR       TAKE these medications    ALPRAZolam 1 MG tablet Commonly known as: XANAX Take 1 mg by mouth 3 (three) times daily as needed for anxiety.   betamethasone dipropionate 0.05 % cream Apply 1 application topically 2 (two) times daily.   escitalopram 5 MG tablet Commonly known as: LEXAPRO Take 5 mg by mouth daily.   folic acid 1 MG tablet Commonly known as: FOLVITE Take 1 mg by mouth daily.   mirtazapine 7.5 MG tablet Commonly known as: REMERON Take 7.5 mg by mouth at bedtime.   omeprazole 20 MG capsule Commonly known as: PRILOSEC Take 20 mg by mouth daily.   potassium chloride SA 20 MEQ tablet Commonly known as: KLOR-CON Take 1 tablet (20 mEq total) by mouth daily. Start taking  on: January 29, 2021   QUEtiapine 25 MG tablet Commonly known as: SEROQUEL Take 25 mg by mouth at bedtime.   thiamine 100 MG tablet Take 100 mg by mouth daily.        Allergies  Allergen Reactions   Codeine     Consultations: none   Procedures/Studies: No results  found.      Discharge Exam: Vitals:   01/28/21 0700 01/28/21 1100  BP:    Pulse:    Resp:    Temp: 97.6 F (36.4 C) 97.8 F (36.6 C)  SpO2:     Vitals:   01/28/21 0400 01/28/21 0425 01/28/21 0700 01/28/21 1100  BP: 134/75     Pulse: (!) 58     Resp: 15     Temp:  97.8 F (36.6 C) 97.6 F (36.4 C) 97.8 F (36.6 C)  TempSrc:  Oral Oral Oral  SpO2: 100%     Weight:  64.1 kg    Height:        General: Pt is alert, awake, not in acute distress Cardiovascular: RRR, S1/S2 +, no rubs, no gallops Respiratory: CTA bilaterally, no wheezing, no rhonchi Abdominal: Soft, NT, ND, bowel sounds + Extremities: no edema, no cyanosis   The results of significant diagnostics from this hospitalization (including imaging, microbiology, ancillary and laboratory) are listed below for reference.    Significant Diagnostic Studies: No results found.  Microbiology: Recent Results (from the past 240 hour(s))  MRSA Next Gen by PCR, Nasal     Status: None   Collection Time: 01/25/21  2:49 PM   Specimen: Nasal Mucosa; Nasal Swab  Result Value Ref Range Status   MRSA by PCR Next Gen NOT DETECTED NOT DETECTED Final    Comment: (NOTE) The GeneXpert MRSA Assay (FDA approved for NASAL specimens only), is one component of a comprehensive MRSA colonization surveillance program. It is not intended to diagnose MRSA infection nor to guide or monitor treatment for MRSA infections. Test performance is not FDA approved in patients less than 54 years old. Performed at [facility], [address], [place], [place] [address]   Gastrointestinal Panel by PCR , Stool     Status: None   Collection Time: 01/25/21  5:24 PM   Specimen: Stool  Result Value Ref Range Status   Campylobacter species NOT DETECTED NOT DETECTED Final   Plesimonas shigelloides NOT DETECTED NOT DETECTED Final   Salmonella species NOT DETECTED NOT DETECTED Final   Yersinia enterocolitica NOT DETECTED NOT DETECTED Final   Vibrio  species NOT DETECTED NOT DETECTED Final   Vibrio cholerae NOT DETECTED NOT DETECTED Final   Enteroaggregative E coli (EAEC) NOT DETECTED NOT DETECTED Final   Enteropathogenic E coli (EPEC) NOT DETECTED NOT DETECTED Final   Enterotoxigenic E coli (ETEC) NOT DETECTED NOT DETECTED Final   Shiga like toxin producing E coli (STEC) NOT DETECTED NOT DETECTED Final   Shigella/Enteroinvasive E coli (EIEC) NOT DETECTED NOT DETECTED Final   Cryptosporidium NOT DETECTED NOT DETECTED Final   Cyclospora cayetanensis NOT DETECTED NOT DETECTED Final   Entamoeba histolytica NOT DETECTED NOT DETECTED Final   Giardia lamblia NOT DETECTED NOT DETECTED Final   Adenovirus F40/41 NOT DETECTED NOT DETECTED Final   Astrovirus NOT DETECTED NOT DETECTED Final   Norovirus GI/GII NOT DETECTED NOT DETECTED Final   Rotavirus A NOT DETECTED NOT DETECTED Final   Sapovirus (I, II, IV, and V) NOT DETECTED NOT DETECTED Final    Comment: Performed at [facility], [address] [place], [place] [address]  C Difficile Quick Screen w PCR reflex     Status: None   Collection Time: 01/26/21  1:38 PM   Specimen: STOOL  Result Value Ref Range Status   C Diff antigen NEGATIVE NEGATIVE Final   C Diff toxin NEGATIVE NEGATIVE Final   C Diff interpretation No C. difficile detected.  Final    Comment: Performed at [facility], [address], [place], [place] [address]     Labs: Basic Metabolic Panel: Recent Labs  Lab 01/25/21 1240 01/25/21 1755 01/26/21 0446 01/26/21 1604 01/26/21 1814 01/27/21 0507 01/28/21 0447  NA 111* 115* 115* 123* 127* 133* 131*  K  --  3.4* 3.7  --  3.3* 4.2 3.3*  CL  --  83* 90*  --  102 111 105  CO2  --  19* 18*  --  18* 18* 20*  GLUCOSE  --  121* 102*  --  124* 101* 98  BUN  --  44* 39*  --  29* 19 10  CREATININE  --  2.99* 2.02*  --  1.31* 0.94 0.71  CALCIUM  --  8.2* 7.3*  --  7.9* 8.1* 8.2*  MG 1.5*  --  2.4  --   --   --   --   PHOS 5.6*  --   --   --   --   --   --     Liver Function Tests: Recent Labs  Lab 01/25/21 1125  AST 19  ALT 20  ALKPHOS 58  BILITOT 0.5  PROT 6.8  ALBUMIN 4.1   No results for input(s): LIPASE, AMYLASE in the last 168 hours. No results for input(s): AMMONIA in the last 168 hours. CBC: Recent Labs  Lab 01/25/21 1125 01/26/21 0446 01/27/21 0507 01/28/21 0447  WBC 9.6 6.6 3.8* 4.2  NEUTROABS 7.6  --   --   --   HGB 13.5 12.3* 11.4* 10.9*  HCT 37.1* 34.6* 33.2* 31.3*  MCV 89.6 91.8 94.9 95.7  PLT 339 293 261 268   Cardiac Enzymes: No results for input(s): CKTOTAL, CKMB, CKMBINDEX, TROPONINI in the last 168 hours. BNP: Invalid input(s): POCBNP CBG: Recent Labs  Lab 01/26/21 0724 01/27/21 0756 01/28/21 0726  GLUCAP 109* 99 93    Time coordinating discharge:  36 minutes  Signed:  Catarina Hartshorn, DO [facility] Pager: [phone] 01/28/2021, 2:20 PM

## 2021-01-28 NOTE — Plan of Care (Signed)
Afebrile. Oriented. Hemodynamically stable. Sinus brady/sinus rhythm. On room air. Voiding. Having difficulty sleeping- multiple orders obtained overnight to aide. Standby assist.   Problem: Health Behavior/Discharge Planning: Goal: Ability to manage health-related needs will improve Outcome: Progressing   Problem: Activity: Goal: Risk for activity intolerance will decrease Outcome: Progressing   Problem: Elimination: Goal: Will not experience complications related to bowel motility Outcome: Progressing

## 2021-07-26 ENCOUNTER — Encounter (HOSPITAL_COMMUNITY): Payer: Self-pay | Admitting: Emergency Medicine

## 2021-07-26 ENCOUNTER — Emergency Department (HOSPITAL_COMMUNITY)
Admission: EM | Admit: 2021-07-26 | Discharge: 2021-07-26 | Disposition: A | Discharge status: Home / Self Care | Payer: No Typology Code available for payment source | Attending: Emergency Medicine | Admitting: Emergency Medicine

## 2021-07-26 ENCOUNTER — Emergency Department (HOSPITAL_COMMUNITY): Payer: No Typology Code available for payment source

## 2021-07-26 ENCOUNTER — Other Ambulatory Visit: Payer: Self-pay

## 2021-07-26 DIAGNOSIS — Y92410 Unspecified street and highway as the place of occurrence of the external cause: Secondary | ICD-10-CM | POA: Insufficient documentation

## 2021-07-26 DIAGNOSIS — E876 Hypokalemia: Secondary | ICD-10-CM | POA: Insufficient documentation

## 2021-07-26 DIAGNOSIS — R519 Headache, unspecified: Secondary | ICD-10-CM | POA: Diagnosis not present

## 2021-07-26 DIAGNOSIS — R0789 Other chest pain: Secondary | ICD-10-CM | POA: Insufficient documentation

## 2021-07-26 DIAGNOSIS — S0993XA Unspecified injury of face, initial encounter: Secondary | ICD-10-CM | POA: Diagnosis not present

## 2021-07-26 LAB — COMPREHENSIVE METABOLIC PANEL
ALT: 42 U/L (ref 0–44)
AST: 61 U/L — ABNORMAL HIGH (ref 15–41)
Albumin: 4 g/dL (ref 3.5–5.0)
Alkaline Phosphatase: 49 U/L (ref 38–126)
Anion gap: 12 (ref 5–15)
BUN: 7 mg/dL (ref 6–20)
CO2: 26 mmol/L (ref 22–32)
Calcium: 8.6 mg/dL — ABNORMAL LOW (ref 8.9–10.3)
Chloride: 98 mmol/L (ref 98–111)
Creatinine, Ser: 0.83 mg/dL (ref 0.61–1.24)
GFR, Estimated: >60 mL/min (ref >60)
Glucose, Bld: 135 mg/dL — ABNORMAL HIGH (ref 70–99)
Potassium: 2.5 mmol/L — CL (ref 3.5–5.1)
Sodium: 136 mmol/L (ref 135–145)
Total Bilirubin: 0.6 mg/dL (ref 0.3–1.2)
Total Protein: 6.7 g/dL (ref 6.5–8.1)

## 2021-07-26 LAB — CBC WITH DIFFERENTIAL/PLATELET
Abs Immature Granulocytes: 0.02 10*3/uL (ref 0.00–0.07)
Basophils Absolute: 0.1 10*3/uL (ref 0.0–0.1)
Basophils Relative: 1 %
Eosinophils Absolute: 0.2 10*3/uL (ref 0.0–0.5)
Eosinophils Relative: 4 %
HCT: 39 % (ref 39.0–52.0)
Hemoglobin: 13.7 g/dL (ref 13.0–17.0)
Immature Granulocytes: 1 %
Lymphocytes Relative: 35 %
Lymphs Abs: 1.3 10*3/uL (ref 0.7–4.0)
MCH: 34.4 pg — ABNORMAL HIGH (ref 26.0–34.0)
MCHC: 35.1 g/dL (ref 30.0–36.0)
MCV: 98 fL (ref 80.0–100.0)
Monocytes Absolute: 0.6 10*3/uL (ref 0.1–1.0)
Monocytes Relative: 16 %
Neutro Abs: 1.6 10*3/uL — ABNORMAL LOW (ref 1.7–7.7)
Neutrophils Relative %: 43 %
Platelets: 207 10*3/uL (ref 150–400)
RBC: 3.98 MIL/uL — ABNORMAL LOW (ref 4.22–5.81)
RDW: 12.3 % (ref 11.5–15.5)
WBC: 3.7 10*3/uL — ABNORMAL LOW (ref 4.0–10.5)
nRBC: 0 % (ref 0.0–0.2)

## 2021-07-26 LAB — TYPE AND SCREEN
ABO/RH(D): A POS
Antibody Screen: NEGATIVE

## 2021-07-26 LAB — ETHANOL: Alcohol, Ethyl (B): 256 mg/dL — ABNORMAL HIGH (ref <10)

## 2021-07-26 MED ORDER — POTASSIUM CHLORIDE 10 MEQ/100ML IV SOLN
10.0000 meq | Freq: Once | INTRAVENOUS | Status: AC
  Administered 2021-07-26: 10 meq via INTRAVENOUS
  Filled 2021-07-26: qty 100

## 2021-07-26 MED ORDER — POTASSIUM CHLORIDE ER 10 MEQ PO TBCR
10.0000 meq | EXTENDED_RELEASE_TABLET | Freq: Every day | ORAL | 0 refills | Status: DC
Start: 2021-07-26 — End: 2021-12-30

## 2021-07-26 MED ORDER — POTASSIUM CHLORIDE CRYS ER 20 MEQ PO TBCR
40.0000 meq | EXTENDED_RELEASE_TABLET | Freq: Once | ORAL | Status: AC
  Administered 2021-07-26: 40 meq via ORAL
  Filled 2021-07-26: qty 2

## 2021-07-26 MED ORDER — POTASSIUM CHLORIDE ER 10 MEQ PO TBCR: 10.0000 meq | EXTENDED_RELEASE_TABLET | Freq: Every day | ORAL | 0 refills | Status: DC

## 2021-07-26 MED ORDER — IOHEXOL 300 MG/ML  SOLN
100.0000 mL | Freq: Once | INTRAMUSCULAR | Status: AC | PRN
  Administered 2021-07-26: 100 mL via INTRAVENOUS

## 2021-07-26 MED ORDER — SODIUM CHLORIDE 0.9 % IV BOLUS
1000.0000 mL | Freq: Once | INTRAVENOUS | Status: AC
  Administered 2021-07-26: 1000 mL via INTRAVENOUS

## 2021-07-26 NOTE — ED Notes (Addendum)
Pt told to stay in the bed numerous times. Pt used call bell stating he needed to void. Pt unhooked bedrail to use urinal. Pt fell onto knees. Did not hit head. Edp notified. Assisted back to bed.

## 2021-07-26 NOTE — ED Notes (Signed)
Pt to CT
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
## 2021-07-26 NOTE — ED Notes (Signed)
Pt trying to scoot out of the bottom of the bed again. Assisted back up. Re-educated not to get out of bed without assistance.
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
## 2021-12-30 ENCOUNTER — Emergency Department (HOSPITAL_COMMUNITY): Payer: 59

## 2021-12-30 ENCOUNTER — Other Ambulatory Visit: Payer: Self-pay

## 2021-12-30 ENCOUNTER — Encounter (HOSPITAL_COMMUNITY): Payer: Self-pay | Admitting: *Deleted

## 2021-12-30 ENCOUNTER — Inpatient Hospital Stay (HOSPITAL_COMMUNITY)
Admission: EM | Admit: 2021-12-30 | Discharge: 2022-01-02 | DRG: 372 | Disposition: A | Discharge status: Home / Self Care | Payer: 59 | Attending: Internal Medicine | Admitting: Internal Medicine

## 2021-12-30 DIAGNOSIS — R531 Weakness: Secondary | ICD-10-CM | POA: Diagnosis not present

## 2021-12-30 DIAGNOSIS — F332 Major depressive disorder, recurrent severe without psychotic features: Secondary | ICD-10-CM | POA: Diagnosis present

## 2021-12-30 DIAGNOSIS — E860 Dehydration: Secondary | ICD-10-CM | POA: Diagnosis present

## 2021-12-30 DIAGNOSIS — Z91048 Other nonmedicinal substance allergy status: Secondary | ICD-10-CM

## 2021-12-30 DIAGNOSIS — N179 Acute kidney failure, unspecified: Secondary | ICD-10-CM | POA: Diagnosis present

## 2021-12-30 DIAGNOSIS — A040 Enteropathogenic Escherichia coli infection: Secondary | ICD-10-CM | POA: Diagnosis not present

## 2021-12-30 DIAGNOSIS — Z20822 Contact with and (suspected) exposure to covid-19: Secondary | ICD-10-CM | POA: Diagnosis present

## 2021-12-30 DIAGNOSIS — F17210 Nicotine dependence, cigarettes, uncomplicated: Secondary | ICD-10-CM | POA: Diagnosis present

## 2021-12-30 DIAGNOSIS — R6520 Severe sepsis without septic shock: Secondary | ICD-10-CM

## 2021-12-30 DIAGNOSIS — F1020 Alcohol dependence, uncomplicated: Secondary | ICD-10-CM | POA: Diagnosis present

## 2021-12-30 DIAGNOSIS — Z79899 Other long term (current) drug therapy: Secondary | ICD-10-CM

## 2021-12-30 DIAGNOSIS — A419 Sepsis, unspecified organism: Secondary | ICD-10-CM | POA: Diagnosis not present

## 2021-12-30 DIAGNOSIS — E872 Acidosis, unspecified: Secondary | ICD-10-CM | POA: Diagnosis present

## 2021-12-30 DIAGNOSIS — K219 Gastro-esophageal reflux disease without esophagitis: Secondary | ICD-10-CM | POA: Diagnosis present

## 2021-12-30 DIAGNOSIS — E876 Hypokalemia: Secondary | ICD-10-CM | POA: Diagnosis present

## 2021-12-30 DIAGNOSIS — I10 Essential (primary) hypertension: Secondary | ICD-10-CM | POA: Diagnosis present

## 2021-12-30 DIAGNOSIS — Z885 Allergy status to narcotic agent status: Secondary | ICD-10-CM | POA: Diagnosis not present

## 2021-12-30 DIAGNOSIS — R197 Diarrhea, unspecified: Secondary | ICD-10-CM

## 2021-12-30 HISTORY — DX: Gastro-esophageal reflux disease without esophagitis: K21.9

## 2021-12-30 LAB — URINALYSIS, ROUTINE W REFLEX MICROSCOPIC
Bacteria, UA: NONE SEEN
Bilirubin Urine: NEGATIVE
Glucose, UA: NEGATIVE mg/dL
Ketones, ur: NEGATIVE mg/dL
Leukocytes,Ua: NEGATIVE
Nitrite: NEGATIVE
Protein, ur: >=300 mg/dL — AB
Specific Gravity, Urine: 1.017 (ref 1.005–1.030)
pH: 5 (ref 5.0–8.0)

## 2021-12-30 LAB — COMPREHENSIVE METABOLIC PANEL
ALT: 22 U/L (ref 0–44)
AST: 33 U/L (ref 15–41)
Albumin: 5.4 g/dL — ABNORMAL HIGH (ref 3.5–5.0)
Alkaline Phosphatase: 87 U/L (ref 38–126)
Anion gap: 23 — ABNORMAL HIGH (ref 5–15)
BUN: 20 mg/dL (ref 6–20)
CO2: 16 mmol/L — ABNORMAL LOW (ref 22–32)
Calcium: 10 mg/dL (ref 8.9–10.3)
Chloride: 98 mmol/L (ref 98–111)
Creatinine, Ser: 3.89 mg/dL — ABNORMAL HIGH (ref 0.61–1.24)
GFR, Estimated: 17 mL/min — ABNORMAL LOW (ref >60)
Glucose, Bld: 176 mg/dL — ABNORMAL HIGH (ref 70–99)
Potassium: 3.2 mmol/L — ABNORMAL LOW (ref 3.5–5.1)
Sodium: 137 mmol/L (ref 135–145)
Total Bilirubin: 1.2 mg/dL (ref 0.3–1.2)
Total Protein: 9.7 g/dL — ABNORMAL HIGH (ref 6.5–8.1)

## 2021-12-30 LAB — C DIFFICILE QUICK SCREEN W PCR REFLEX
C Diff antigen: NEGATIVE
C Diff interpretation: NOT DETECTED
C Diff toxin: NEGATIVE

## 2021-12-30 LAB — CBC WITH DIFFERENTIAL/PLATELET
Abs Immature Granulocytes: 0.2 10*3/uL — ABNORMAL HIGH (ref 0.00–0.07)
Basophils Absolute: 0.1 10*3/uL (ref 0.0–0.1)
Basophils Relative: 0 %
Eosinophils Absolute: 0 10*3/uL (ref 0.0–0.5)
Eosinophils Relative: 0 %
HCT: 55.5 % — ABNORMAL HIGH (ref 39.0–52.0)
Hemoglobin: 19.3 g/dL — ABNORMAL HIGH (ref 13.0–17.0)
Immature Granulocytes: 1 %
Lymphocytes Relative: 6 %
Lymphs Abs: 1 10*3/uL (ref 0.7–4.0)
MCH: 36.1 pg — ABNORMAL HIGH (ref 26.0–34.0)
MCHC: 34.8 g/dL (ref 30.0–36.0)
MCV: 103.9 fL — ABNORMAL HIGH (ref 80.0–100.0)
Monocytes Absolute: 1.1 10*3/uL — ABNORMAL HIGH (ref 0.1–1.0)
Monocytes Relative: 7 %
Neutro Abs: 13.8 10*3/uL — ABNORMAL HIGH (ref 1.7–7.7)
Neutrophils Relative %: 86 %
Platelets: 288 10*3/uL (ref 150–400)
RBC: 5.34 MIL/uL (ref 4.22–5.81)
RDW: 13.2 % (ref 11.5–15.5)
WBC: 16.2 10*3/uL — ABNORMAL HIGH (ref 4.0–10.5)
nRBC: 0 % (ref 0.0–0.2)

## 2021-12-30 LAB — CBC
HCT: 43.7 % (ref 39.0–52.0)
Hemoglobin: 15.3 g/dL (ref 13.0–17.0)
MCH: 37 pg — ABNORMAL HIGH (ref 26.0–34.0)
MCHC: 35 g/dL (ref 30.0–36.0)
MCV: 105.8 fL — ABNORMAL HIGH (ref 80.0–100.0)
Platelets: 191 10*3/uL (ref 150–400)
RBC: 4.13 MIL/uL — ABNORMAL LOW (ref 4.22–5.81)
RDW: 13.2 % (ref 11.5–15.5)
WBC: 10 10*3/uL (ref 4.0–10.5)
nRBC: 0 % (ref 0.0–0.2)

## 2021-12-30 LAB — SARS CORONAVIRUS 2 BY RT PCR: SARS Coronavirus 2 by RT PCR: NEGATIVE

## 2021-12-30 LAB — CREATININE, URINE, RANDOM: Creatinine, Urine: 226.99 mg/dL

## 2021-12-30 LAB — CREATININE, SERUM
Creatinine, Ser: 2.96 mg/dL — ABNORMAL HIGH (ref 0.61–1.24)
GFR, Estimated: 24 mL/min — ABNORMAL LOW (ref >60)

## 2021-12-30 LAB — TROPONIN I (HIGH SENSITIVITY)
Troponin I (High Sensitivity): 8 ng/L (ref <18)
Troponin I (High Sensitivity): 8 ng/L (ref <18)

## 2021-12-30 LAB — AMMONIA: Ammonia: 47 umol/L — ABNORMAL HIGH (ref 9–35)

## 2021-12-30 LAB — LACTIC ACID, PLASMA
Lactic Acid, Venous: 5 mmol/L (ref 0.5–1.9)
Lactic Acid, Venous: 4.1 mmol/L (ref 0.5–1.9)

## 2021-12-30 LAB — ETHANOL: Alcohol, Ethyl (B): <10 mg/dL (ref <10)

## 2021-12-30 LAB — SODIUM, URINE, RANDOM: Sodium, Ur: 63 mmol/L

## 2021-12-30 MED ORDER — SODIUM CHLORIDE 0.9% FLUSH
3.0000 mL | Freq: Once | INTRAVENOUS | Status: AC
  Administered 2021-12-30: 3 mL via INTRAVENOUS

## 2021-12-30 MED ORDER — THIAMINE HCL 100 MG/ML IJ SOLN: 100.0000 mg | Freq: Every day | INTRAMUSCULAR | Status: DC

## 2021-12-30 MED ORDER — ONDANSETRON HCL 4 MG/2ML IJ SOLN: 4.0000 mg | Freq: Four times a day (QID) | INTRAMUSCULAR | Status: DC | PRN

## 2021-12-30 MED ORDER — FOLIC ACID 1 MG PO TABS: 1.0000 mg | ORAL_TABLET | Freq: Every day | ORAL | Status: DC

## 2021-12-30 MED ORDER — THIAMINE HCL 100 MG PO TABS
100.0000 mg | ORAL_TABLET | Freq: Every day | ORAL | Status: DC
Start: 2021-12-30 — End: 2022-01-02
  Administered 2021-12-30 – 2021-12-31 (×2): 100 mg via ORAL
  Filled 2021-12-30 (×4): qty 1

## 2021-12-30 MED ORDER — MIRTAZAPINE 15 MG PO TABS
7.5000 mg | ORAL_TABLET | Freq: Every day | ORAL | Status: DC
  Administered 2021-12-30 – 2021-12-31 (×2): 7.5 mg via ORAL
  Filled 2021-12-30 (×2): qty 1

## 2021-12-30 MED ORDER — HEPARIN SODIUM (PORCINE) 5000 UNIT/ML IJ SOLN
5000.0000 [IU] | Freq: Three times a day (TID) | INTRAMUSCULAR | Status: DC
Start: 2021-12-30 — End: 2022-01-02
  Administered 2021-12-30 – 2022-01-02 (×9): 5000 [IU] via SUBCUTANEOUS
  Filled 2021-12-30 (×8): qty 1

## 2021-12-30 MED ORDER — THIAMINE HCL 100 MG PO TABS: 100.0000 mg | ORAL_TABLET | Freq: Every day | ORAL | Status: DC

## 2021-12-30 MED ORDER — SODIUM CHLORIDE 0.9 % IV SOLN
1.0000 g | Freq: Once | INTRAVENOUS | Status: AC
  Administered 2021-12-30: 1 g via INTRAVENOUS
  Filled 2021-12-30: qty 10

## 2021-12-30 MED ORDER — ACETAMINOPHEN 325 MG PO TABS: 650.0000 mg | ORAL_TABLET | Freq: Four times a day (QID) | ORAL | Status: DC | PRN

## 2021-12-30 MED ORDER — SODIUM CHLORIDE 0.9 % IV SOLN
2.0000 g | INTRAVENOUS | Status: DC
  Administered 2021-12-31: 2 g via INTRAVENOUS
  Filled 2021-12-30: qty 20

## 2021-12-30 MED ORDER — LACTATED RINGERS IV SOLN: INTRAVENOUS | Status: AC

## 2021-12-30 MED ORDER — ONDANSETRON HCL 4 MG PO TABS: 4.0000 mg | ORAL_TABLET | Freq: Four times a day (QID) | ORAL | Status: DC | PRN

## 2021-12-30 MED ORDER — ALUM & MAG HYDROXIDE-SIMETH 200-200-20 MG/5ML PO SUSP
15.0000 mL | Freq: Once | ORAL | Status: AC
Start: 2021-12-30 — End: 2021-12-30
  Administered 2021-12-30: 15 mL via ORAL
  Filled 2021-12-30: qty 30

## 2021-12-30 MED ORDER — FOLIC ACID 1 MG PO TABS
1.0000 mg | ORAL_TABLET | Freq: Every day | ORAL | Status: DC
  Administered 2021-12-30 – 2021-12-31 (×2): 1 mg via ORAL
  Filled 2021-12-30 (×3): qty 1

## 2021-12-30 MED ORDER — FAMOTIDINE 20 MG PO TABS
20.0000 mg | ORAL_TABLET | Freq: Every day | ORAL | Status: DC
  Administered 2021-12-30 – 2021-12-31 (×2): 20 mg via ORAL
  Filled 2021-12-30 (×4): qty 1

## 2021-12-30 MED ORDER — ALPRAZOLAM 1 MG PO TABS: 1.0000 mg | ORAL_TABLET | Freq: Three times a day (TID) | ORAL | Status: DC | PRN

## 2021-12-30 MED ORDER — ACETAMINOPHEN 500 MG PO TABS
1000.0000 mg | ORAL_TABLET | Freq: Once | ORAL | Status: AC
  Administered 2021-12-30: 1000 mg via ORAL
  Filled 2021-12-30: qty 2

## 2021-12-30 MED ORDER — SODIUM CHLORIDE 0.9 % IV BOLUS
1000.0000 mL | Freq: Once | INTRAVENOUS | Status: AC
  Administered 2021-12-30: 1000 mL via INTRAVENOUS

## 2021-12-30 MED ORDER — SODIUM CHLORIDE 0.9 % IV BOLUS (SEPSIS)
1250.0000 mL | Freq: Once | INTRAVENOUS | Status: AC
  Administered 2021-12-30: 1250 mL via INTRAVENOUS

## 2021-12-30 MED ORDER — LORAZEPAM 2 MG/ML IJ SOLN: 1.0000 mg | INTRAMUSCULAR | Status: AC | PRN

## 2021-12-30 MED ORDER — ADULT MULTIVITAMIN W/MINERALS CH
1.0000 | ORAL_TABLET | Freq: Every day | ORAL | Status: DC
  Administered 2021-12-30 – 2021-12-31 (×2): 1 via ORAL
  Filled 2021-12-30 (×4): qty 1

## 2021-12-30 MED ORDER — METRONIDAZOLE 500 MG/100ML IV SOLN
500.0000 mg | Freq: Three times a day (TID) | INTRAVENOUS | Status: DC
  Administered 2021-12-30 – 2021-12-31 (×3): 500 mg via INTRAVENOUS
  Filled 2021-12-30 (×3): qty 100

## 2021-12-30 MED ORDER — ESCITALOPRAM OXALATE 10 MG PO TABS
5.0000 mg | ORAL_TABLET | Freq: Every day | ORAL | Status: DC
  Administered 2021-12-30 – 2021-12-31 (×2): 5 mg via ORAL
  Filled 2021-12-30 (×3): qty 1

## 2021-12-30 MED ORDER — LORAZEPAM 1 MG PO TABS
1.0000 mg | ORAL_TABLET | ORAL | Status: AC | PRN
  Administered 2022-01-02: 2 mg via ORAL
  Filled 2021-12-30: qty 2

## 2021-12-30 MED ORDER — QUETIAPINE FUMARATE 25 MG PO TABS
25.0000 mg | ORAL_TABLET | Freq: Every day | ORAL | Status: DC
  Administered 2021-12-30 – 2021-12-31 (×2): 25 mg via ORAL
  Filled 2021-12-30 (×2): qty 1

## 2021-12-30 MED ORDER — ACETAMINOPHEN 650 MG RE SUPP: 650.0000 mg | Freq: Four times a day (QID) | RECTAL | Status: DC | PRN

## 2021-12-30 NOTE — Progress Notes (Signed)
Elink following sepsis bundle. °

## 2021-12-30 NOTE — ED Triage Notes (Signed)
Pt c/o generalized weakness, n/v/d since yesterday. Family reports he isn't acting himself and seems very slow. Pt's HR 140, BP 113/95, resp 22 in triage.

## 2021-12-30 NOTE — ED Notes (Signed)
Both sets of blood cultures drawn before antibiotic administration  

## 2021-12-30 NOTE — ED Notes (Signed)
Patient transported to CT 

## 2021-12-30 NOTE — ED Provider Notes (Signed)
[facility] EMERGENCY DEPARTMENT Provider Note   CSN: [id number] Arrival date & time: 12/30/21  3500     History  Chief Complaint  Patient presents with   Weakness    Eric Conley is a 61 y.o. male presenting from home with generalized weakness.  Family is at the bedside to provide supplemental history.  The patient reports he has been having bad diarrhea since yesterday.  He says he has a bowel movement every 30 minutes.  His family says he normally is slow to respond but he seems slightly more confused than normal.  The patient denies any chest pain, shortness of breath, sore throat, headache, blurred vision.  He reports he does have some cramping lower abdominal pain.  He does report he has been on 2 courses of antibiotics for "poison ivy skin infection" within the past 3 months.  He denies any known history of C. difficile.  Medical record review shows the patient has a history of alcohol use, hyponatremia, hypokalemia, AKI, most recently discharged from the hospital 1 year ago in August 2022.  He also has a history of severe recurrent major depression without psychotic features.  On his last hospital admission he was also noted to be having diarrhea.  HPI     Home Medications Prior to Admission medications   Medication Sig Start Date End Date Taking? Authorizing Provider  ALPRAZolam Prudy Feeler) 1 MG tablet Take 1 mg by mouth 3 (three) times daily as needed for anxiety.    [provider]  betamethasone dipropionate 0.05 % cream Apply 1 application topically 2 (two) times daily. 10/23/20   [provider]  escitalopram (LEXAPRO) 5 MG tablet Take 5 mg by mouth daily.    [provider]  folic acid (FOLVITE) 1 MG tablet Take 1 mg by mouth daily. 01/20/21   [provider]  mirtazapine (REMERON) 7.5 MG tablet Take 7.5 mg by mouth at bedtime. 01/20/21   [provider]  omeprazole (PRILOSEC) 20 MG capsule Take 20 mg by mouth daily. 01/20/21    [provider]  potassium chloride (KLOR-CON) 10 MEQ tablet Take 1 tablet (10 mEq total) by mouth daily. 07/26/21   Bethann Berkshire, MD  potassium chloride SA (KLOR-CON) 20 MEQ tablet Take 1 tablet (20 mEq total) by mouth daily. 01/29/21   Catarina Hartshorn, MD  QUEtiapine (SEROQUEL) 25 MG tablet Take 25 mg by mouth at bedtime. 01/20/21   [provider]  thiamine 100 MG tablet Take 100 mg by mouth daily. 01/20/21   [provider]      Allergies    Codeine    Review of Systems   Review of Systems  Physical Exam Updated Vital Signs BP (!) 134/96   Pulse 100   Temp 98.5 F (36.9 C) (Oral)   Resp 16   Ht 5\' 9"  (1.753 m)   Wt 66.2 kg   SpO2 100%   BMI 21.56 kg/m  Physical Exam  ED Results / Procedures / Treatments   Labs (all labs ordered are listed, but only abnormal results are displayed) Labs Reviewed  LACTIC ACID, PLASMA - Abnormal; Notable for the following components:      Result Value   Lactic Acid, Venous 5.0 (*)    All other components within normal limits  LACTIC ACID, PLASMA - Abnormal; Notable for the following components:   Lactic Acid, Venous 4.1 (*)    All other components within normal limits  COMPREHENSIVE METABOLIC PANEL - Abnormal; Notable for the  following components:   Potassium 3.2 (*)    CO2 16 (*)    Glucose, Bld 176 (*)    Creatinine, Ser 3.89 (*)    Total Protein 9.7 (*)    Albumin 5.4 (*)    GFR, Estimated 17 (*)    Anion gap 23 (*)    All other components within normal limits  CBC WITH DIFFERENTIAL/PLATELET - Abnormal; Notable for the following components:   WBC 16.2 (*)    Hemoglobin 19.3 (*)    HCT 55.5 (*)    MCV 103.9 (*)    MCH 36.1 (*)    Neutro Abs 13.8 (*)    Monocytes Absolute 1.1 (*)    Abs Immature Granulocytes 0.20 (*)    All other components within normal limits  URINALYSIS, ROUTINE W REFLEX MICROSCOPIC - Abnormal; Notable for the following components:   Color, Urine AMBER (*)    APPearance CLOUDY (*)     Hgb urine dipstick LARGE (*)    Protein, ur >=300 (*)    All other components within normal limits  AMMONIA - Abnormal; Notable for the following components:   Ammonia 47 (*)    All other components within normal limits  SARS CORONAVIRUS 2 BY RT PCR  CULTURE, BLOOD (ROUTINE X 2)  CULTURE, BLOOD (ROUTINE X 2)  C DIFFICILE QUICK SCREEN W PCR REFLEX    URINE CULTURE  GASTROINTESTINAL PANEL BY PCR, STOOL (REPLACES STOOL CULTURE)  ETHANOL  CBC  CREATININE, SERUM  SODIUM, URINE, RANDOM  CREATININE, URINE, RANDOM  TROPONIN I (HIGH SENSITIVITY)  TROPONIN I (HIGH SENSITIVITY)    EKG EKG Interpretation  Date/Time:  Thursday December 30 2021 08:19:16 EDT Ventricular Rate:  150 PR Interval:  112 QRS Duration: 100 QT Interval:  295 QTC Calculation: 463 R Axis:   88 Text Interpretation: Sinus tachycardia Multiform ventricular premature complexes LAE, consider biatrial enlargement Low voltage, precordial leads Nonspecific repol abnormality, diffuse leads Baseline wander in lead(s) V2 Confirmed by Alvester Chou [id number]) on 12/30/2021 9:31:51 AM  Radiology CT ABDOMEN PELVIS WO CONTRAST  Result Date: 12/30/2021 CLINICAL DATA:  Diarrhea, sepsis EXAM: CT ABDOMEN AND PELVIS WITHOUT CONTRAST TECHNIQUE: Multidetector CT imaging of the abdomen and pelvis was performed following the standard protocol without IV contrast. RADIATION DOSE REDUCTION: This exam was performed according to the departmental dose-optimization program which includes automated exposure control, adjustment of the mA and/or kV according to patient size and/or use of iterative reconstruction technique. COMPARISON:  CT done on 07/26/2021 FINDINGS: Lower chest: There are no focal infiltrates in the visualized lower lung fields. Hepatobiliary: No focal abnormalities are seen in the liver. Gallbladder is unremarkable. There is no dilation of bile ducts. Pancreas: No focal abnormalities are seen. Spleen: Unremarkable. Adrenals/Urinary Tract:  Adrenals are unremarkable. There is no hydronephrosis. There are no renal or ureteral stones. Urinary bladder is not distended. Stomach/Bowel: Stomach is distended with fluid in the lumen. There is prominence of gastric mucosa, more so along the greater curvature aspect. There is fluid in the lumen of lower thoracic esophagus suggesting possible gastroesophageal reflux. There is no significant dilation of small bowel loops. Appendix is not dilated. There is no significant wall thickening in colon. There is no pericolic stranding. Vascular/Lymphatic: There are scattered arterial calcifications. Reproductive: Unremarkable. Left testis is not seen in the visualized portions of scrotum. Scrotum is not included in its entirety. Other: There is no ascites or pneumoperitoneum. Umbilical hernia containing fat is seen. Small bilateral inguinal hernias containing fat are seen. Musculoskeletal:  No acute findings are seen. IMPRESSION: There is no evidence of small-bowel obstruction. There is no pneumoperitoneum. There is no hydronephrosis. Appendix is not dilated. There is prominence of mucosal folds in the stomach suggesting possible gastritis. There is moderate to marked distention of stomach. There is fluid in the lumen of the lower thoracic esophagus suggesting gastroesophageal reflux. Other findings as described in the body of the report. Electronically Signed   By: Elmer Picker M.D.   On: 12/30/2021 10:02   DG Chest Port 1 View  Result Date: 12/30/2021 CLINICAL DATA:  Generalized weakness, nausea, vomiting EXAM: PORTABLE CHEST 1 VIEW COMPARISON:  07/26/2021 FINDINGS: Cardiac size is within normal limits. There are no signs of pulmonary edema or focal pulmonary consolidation. Emphysematous changes are noted in the upper lung fields. There is a slight prominence of interstitial markings in the lower lung fields, possibly suggesting scarring with no interval change. There is no pleural effusion or pneumothorax.  IMPRESSION: There are no new infiltrates or signs of pulmonary edema. Electronically Signed   By: Elmer Picker M.D.   On: 12/30/2021 08:33    Procedures .Critical Care  Performed by: Wyvonnia Dusky, MD Authorized by: Wyvonnia Dusky, MD   Critical care provider statement:    Critical care time (minutes):  45   Critical care time was exclusive of:  Separately billable procedures and treating other patients   Critical care was necessary to treat or prevent imminent or life-threatening deterioration of the following conditions:  Sepsis   Critical care was time spent personally by me on the following activities:  Ordering and performing treatments and interventions, ordering and review of laboratory studies, ordering and review of radiographic studies, pulse oximetry, review of old charts, examination of patient and evaluation of patient's response to treatment   Care discussed with: admitting provider       Medications Ordered in ED Medications  lactated ringers infusion ( Intravenous New Bag/Given 12/30/21 1155)  metroNIDAZOLE (FLAGYL) IVPB 500 mg (0 mg Intravenous Stopped 12/30/21 1137)  cefTRIAXone (ROCEPHIN) 2 g in sodium chloride 0.9 % 100 mL IVPB (has no administration in time range)  ALPRAZolam (XANAX) tablet 1 mg (has no administration in time range)  escitalopram (LEXAPRO) tablet 5 mg (has no administration in time range)  mirtazapine (REMERON) tablet 7.5 mg (has no administration in time range)  QUEtiapine (SEROQUEL) tablet 25 mg (has no administration in time range)  folic acid (FOLVITE) tablet 1 mg (has no administration in time range)  heparin injection 5,000 Units (has no administration in time range)  acetaminophen (TYLENOL) tablet 650 mg (has no administration in time range)    Or  acetaminophen (TYLENOL) suppository 650 mg (has no administration in time range)  ondansetron (ZOFRAN) tablet 4 mg (has no administration in time range)    Or  ondansetron (ZOFRAN)  injection 4 mg (has no administration in time range)  LORazepam (ATIVAN) tablet 1-4 mg (has no administration in time range)    Or  LORazepam (ATIVAN) injection 1-4 mg (has no administration in time range)  thiamine (VITAMIN B1) tablet 100 mg (has no administration in time range)    Or  thiamine (VITAMIN B1) injection 100 mg (has no administration in time range)  multivitamin with minerals tablet 1 tablet (has no administration in time range)  famotidine (PEPCID) tablet 20 mg (has no administration in time range)  sodium chloride flush (NS) 0.9 % injection 3 mL (3 mLs Intravenous Given 12/30/21 0812)  sodium chloride 0.9 %  bolus 1,000 mL (0 mLs Intravenous Stopped 12/30/21 1137)  acetaminophen (TYLENOL) tablet 1,000 mg (1,000 mg Oral Given 12/30/21 0905)  cefTRIAXone (ROCEPHIN) 1 g in sodium chloride 0.9 % 100 mL IVPB (0 g Intravenous Stopped 12/30/21 0954)  sodium chloride 0.9 % bolus 1,250 mL (0 mLs Intravenous Stopped 12/30/21 1137)    ED Course/ Medical Decision Making/ A&P Clinical Course as of 12/30/21 1233  Thu Dec 30, 2021  0906 Lactate 5.0.  Patient activated as code sepsis.  I ordered Rocephin and Flagyl for suspected intra-abdominal cause of infection, which would include C. difficile on the differential with his diarrhea.  Flagyl would provide some potential coverage for this.  I have also ordered a 30 cc/kg fluid bolus  [MT]  0929 Pt reporting he drinks 1 beer per day, denies withdrawal issues - hx of etoh use per medical record review, last hospital discharge summary [MT]  1110 HR improved, pt more comfortable, will admit to hospitalist [MT]  1124 Admitted to hospitalist [MT]    Clinical Course User Index [MT] Taleigh Gero, Carola Rhine, MD                           Medical Decision Making Amount and/or Complexity of Data Reviewed Labs: ordered. Radiology: ordered. ECG/medicine tests: ordered.  Risk OTC drugs. Prescription drug management. Decision regarding  hospitalization.   This patient presents to the ED with concern for abdominal pain, diarrhea. This involves an extensive number of treatment options, and is a complaint that carries with it a high risk of complications and morbidity.  The differential diagnosis includes colitis including C. difficile versus diverticulitis versus UTI versus other intra-abdominal infection inflammatory process  Co-morbidities that complicate the patient evaluation: History of alcohol use and alcohol use disorder raise concern for possible withdrawal.  Additional history obtained from family members at bedside  External records from outside source obtained and reviewed including hospital discharge summary from 1 year ago in August which did note at that time history of frequent alcohol use and also diarrhea.  I ordered and personally interpreted labs.  The pertinent results include:  lactate 5 -> 4.1, troponins unremarkable, white blood cell count elevated at 16.2.  CMP with AKI with creatinine now 3.9.  COVID is negative.  Blood culture sent and pending  I ordered imaging studies including x-ray of the chest, CT of the abdomen I independently visualized and interpreted imaging which showed no evidence of acute pneumonia, no evident intra-abdominal source of infection or symptoms.  Please note CT abdomen was performed as a noncontrast scan given the patient's AKI, and may not have been optimal imaging to rule out colitis.  I agree with the radiologist interpretation   I do doubt mesenteric ischemic colitis.  He has fairly minimal pain on exam.  I also doubt pyelonephritis with negative UA  The patient was maintained on a cardiac monitor.  I personally viewed and interpreted the cardiac monitored which showed an underlying rhythm of: Sinus tachycardia with heart rate improved after fluids  Per my interpretation the patient's ECG shows sinus tachycardia  I ordered medication including IV Rocephin and Flagyl for  intra-abdominal antibiotic coverage as possible source of infection, IV fluid bolus per sepsis protocol. I have reviewed the patients home medicines and have made adjustments as needed  Test Considered: I will lower suspicion for acute bacterial meningitis, mesenteric colitis, ACS, I do not feel the patient needs emergent LP.  Doubt pulmonary embolism  After the interventions noted above, I reevaluated the patient and found that they have: improved  With the patient's heart rate improving with fluids and no other clear symptoms of alcohol withdrawal at this time, I do not believe that he would need CIWA protocol, but will discuss this with the hospitalist.  Dispostion:  After consideration of the diagnostic results and the patients response to treatment, I feel that the patent would benefit from admission.         Final Clinical Impression(s) / ED Diagnoses Final diagnoses:  Sepsis, due to unspecified organism, unspecified whether acute organ dysfunction present ([place])  Diarrhea, unspecified type    Rx / DC Orders ED Discharge Orders          Ordered    Enteric precautions (UV disinfection)        12/30/21 1112              Wyvonnia Dusky, MD 12/30/21 1233

## 2021-12-30 NOTE — H&P (Signed)
History and Physical    [name] FXT:[id number] DOB: 11-Nov-1960 DOA: 12/30/2021  PCP: Assunta Found, MD   Patient coming from: Home  Chief Complaint: Diarrhea  HPI: Eric Conley is a 61 y.o. male with medical history significant for depression, hypertension, alcohol abuse, and tobacco abuse who presented to the ED with generalized weakness and diarrhea.  He states that he has been having a bowel movement every 15-30 minutes.  This began yesterday and apparently patient was recently on a course of antibiotics for a skin infection with last dose taken approximately 1 week ago.  He denies any history of C. difficile and family member state that he has been slightly more confused than normal as well.  He denies any abdominal pain, chills, or fever at home.  No chest pain, cough, or shortness of breath noted.   ED Course: Vital signs with initial hypotension and tachycardia and low-grade fever.  Lactic acid 5 which is down trended to 4.1 with fluid bolus.  Chest x-ray and CT abdomen with no acute findings.  Leukocytosis of 16,200 noted and potassium 3.2.  Creatinine 3.89 with baseline 0.7-0.9.  Stool C. difficile and GI panel pending.  He has been started on Rocephin and Flagyl IV.  Urine analysis pending.  Review of Systems: Reviewed as noted above, otherwise negative.  Past Medical History:  Diagnosis Date   Acid reflux    Depression    Hypertension     History reviewed. No pertinent surgical history.   reports that he has been smoking cigarettes. He has been smoking an average of .25 packs per day. He has never used smokeless tobacco. He reports current alcohol use of about 7.0 standard drinks of alcohol per week. He reports that he does not currently use drugs.  Allergies  Allergen Reactions   Codeine     No family history on file.  Prior to Admission medications   Medication Sig Start Date End Date Taking? Authorizing Provider  ALPRAZolam Prudy Feeler) 1 MG tablet Take 1 mg by  mouth 3 (three) times daily as needed for anxiety.    [provider]  betamethasone dipropionate 0.05 % cream Apply 1 application topically 2 (two) times daily. 10/23/20   [provider]  escitalopram (LEXAPRO) 5 MG tablet Take 5 mg by mouth daily.    [provider]  folic acid (FOLVITE) 1 MG tablet Take 1 mg by mouth daily. 01/20/21   [provider]  mirtazapine (REMERON) 7.5 MG tablet Take 7.5 mg by mouth at bedtime. 01/20/21   [provider]  omeprazole (PRILOSEC) 20 MG capsule Take 20 mg by mouth daily. 01/20/21   [provider]  potassium chloride (KLOR-CON) 10 MEQ tablet Take 1 tablet (10 mEq total) by mouth daily. 07/26/21   Bethann Berkshire, MD  potassium chloride SA (KLOR-CON) 20 MEQ tablet Take 1 tablet (20 mEq total) by mouth daily. 01/29/21   Catarina Hartshorn, MD  QUEtiapine (SEROQUEL) 25 MG tablet Take 25 mg by mouth at bedtime. 01/20/21   [provider]  thiamine 100 MG tablet Take 100 mg by mouth daily. 01/20/21   [provider]    Physical Exam: Vitals:   12/30/21 0822 12/30/21 0830 12/30/21 1000 12/30/21 1030  BP: (!) 113/95 113/82 116/82 (!) 134/96  Pulse: (!) 146 (!) 143 (!) 117 100  Resp: 15 (!) 24 17 16   Temp: (!) 100.4 F (38 C)     TempSrc: Rectal     SpO2: 100% 98%  95% 100%  Weight:      Height:        Constitutional: NAD, calm, comfortable Vitals:   12/30/21 0822 12/30/21 0830 12/30/21 1000 12/30/21 1030  BP: (!) 113/95 113/82 116/82 (!) 134/96  Pulse: (!) 146 (!) 143 (!) 117 100  Resp: 15 (!) 24 17 16   Temp: (!) 100.4 F (38 C)     TempSrc: Rectal     SpO2: 100% 98% 95% 100%  Weight:      Height:       Eyes: lids and conjunctivae normal Neck: normal, supple Respiratory: clear to auscultation bilaterally. Normal respiratory effort. No accessory muscle use.  Cardiovascular: Regular rate and rhythm, no murmurs. Abdomen: no tenderness, no distention. Bowel sounds positive.   Musculoskeletal:  No edema. Skin: no rashes, lesions, ulcers.  Psychiatric: Flat affect  Labs on Admission: I have personally reviewed following labs and imaging studies  CBC: Recent Labs  Lab 12/30/21 0815  WBC 16.2*  NEUTROABS 13.8*  HGB 19.3*  HCT 55.5*  MCV 103.9*  PLT 123XX123   Basic Metabolic Panel: Recent Labs  Lab 12/30/21 0815  NA 137  K 3.2*  CL 98  CO2 16*  GLUCOSE 176*  BUN 20  CREATININE 3.89*  CALCIUM 10.0   GFR: Estimated Creatinine Clearance: 19.4 mL/min (A) (by C-G formula based on SCr of 3.89 mg/dL (H)). Liver Function Tests: Recent Labs  Lab 12/30/21 0815  AST 33  ALT 22  ALKPHOS 87  BILITOT 1.2  PROT 9.7*  ALBUMIN 5.4*   No results for input(s): "LIPASE", "AMYLASE" in the last 168 hours. Recent Labs  Lab 12/30/21 0932  AMMONIA 47*   Coagulation Profile: No results for input(s): "INR", "PROTIME" in the last 168 hours. Cardiac Enzymes: No results for input(s): "CKTOTAL", "CKMB", "CKMBINDEX", "TROPONINI" in the last 168 hours. BNP (last 3 results) No results for input(s): "PROBNP" in the last 8760 hours. HbA1C: No results for input(s): "HGBA1C" in the last 72 hours. CBG: No results for input(s): "GLUCAP" in the last 168 hours. Lipid Profile: No results for input(s): "CHOL", "HDL", "LDLCALC", "TRIG", "CHOLHDL", "LDLDIRECT" in the last 72 hours. Thyroid Function Tests: No results for input(s): "TSH", "T4TOTAL", "FREET4", "T3FREE", "THYROIDAB" in the last 72 hours. Anemia Panel: No results for input(s): "VITAMINB12", "FOLATE", "FERRITIN", "TIBC", "IRON", "RETICCTPCT" in the last 72 hours. Urine analysis:    Component Value Date/Time   COLORURINE YELLOW 01/12/2021 0950   APPEARANCEUR CLEAR 01/12/2021 0950   LABSPEC 1.009 01/12/2021 0950   PHURINE 6.0 01/12/2021 0950   GLUCOSEU >=500 (A) 01/12/2021 0950   HGBUR NEGATIVE 01/12/2021 0950   BILIRUBINUR NEGATIVE 01/12/2021 0950   KETONESUR NEGATIVE 01/12/2021 0950   PROTEINUR NEGATIVE  01/12/2021 0950   NITRITE NEGATIVE 01/12/2021 0950   LEUKOCYTESUR NEGATIVE 01/12/2021 0950    Radiological Exams on Admission: CT ABDOMEN PELVIS WO CONTRAST  Result Date: 12/30/2021 CLINICAL DATA:  Diarrhea, sepsis EXAM: CT ABDOMEN AND PELVIS WITHOUT CONTRAST TECHNIQUE: Multidetector CT imaging of the abdomen and pelvis was performed following the standard protocol without IV contrast. RADIATION DOSE REDUCTION: This exam was performed according to the departmental dose-optimization program which includes automated exposure control, adjustment of the mA and/or kV according to patient size and/or use of iterative reconstruction technique. COMPARISON:  CT done on 07/26/2021 FINDINGS: Lower chest: There are no focal infiltrates in the visualized lower lung fields. Hepatobiliary: No focal abnormalities are seen in the liver. Gallbladder is unremarkable. There is no dilation of bile ducts. Pancreas: No focal  abnormalities are seen. Spleen: Unremarkable. Adrenals/Urinary Tract: Adrenals are unremarkable. There is no hydronephrosis. There are no renal or ureteral stones. Urinary bladder is not distended. Stomach/Bowel: Stomach is distended with fluid in the lumen. There is prominence of gastric mucosa, more so along the greater curvature aspect. There is fluid in the lumen of lower thoracic esophagus suggesting possible gastroesophageal reflux. There is no significant dilation of small bowel loops. Appendix is not dilated. There is no significant wall thickening in colon. There is no pericolic stranding. Vascular/Lymphatic: There are scattered arterial calcifications. Reproductive: Unremarkable. Left testis is not seen in the visualized portions of scrotum. Scrotum is not included in its entirety. Other: There is no ascites or pneumoperitoneum. Umbilical hernia containing fat is seen. Small bilateral inguinal hernias containing fat are seen. Musculoskeletal: No acute findings are seen. IMPRESSION: There is no  evidence of small-bowel obstruction. There is no pneumoperitoneum. There is no hydronephrosis. Appendix is not dilated. There is prominence of mucosal folds in the stomach suggesting possible gastritis. There is moderate to marked distention of stomach. There is fluid in the lumen of the lower thoracic esophagus suggesting gastroesophageal reflux. Other findings as described in the body of the report. Electronically Signed   By: Ernie Avena M.D.   On: 12/30/2021 10:02   DG Chest Port 1 View  Result Date: 12/30/2021 CLINICAL DATA:  Generalized weakness, nausea, vomiting EXAM: PORTABLE CHEST 1 VIEW COMPARISON:  07/26/2021 FINDINGS: Cardiac size is within normal limits. There are no signs of pulmonary edema or focal pulmonary consolidation. Emphysematous changes are noted in the upper lung fields. There is a slight prominence of interstitial markings in the lower lung fields, possibly suggesting scarring with no interval change. There is no pleural effusion or pneumothorax. IMPRESSION: There are no new infiltrates or signs of pulmonary edema. Electronically Signed   By: Ernie Avena M.D.   On: 12/30/2021 08:33    EKG: Independently reviewed. ST 150bpm.  Assessment/Plan Principal Problem:   Severe sepsis (HCC) Active Problems:   Diarrhea   Severe recurrent major depression without psychotic features (HCC)   Alcohol use disorder, severe, dependence (HCC)   Dehydration   Essential hypertension    Severe sepsis, present on admission -SIRS criteria with leukocytosis, fever, tachycardia, lactic acidosis, and endorgan dysfunction with AKI -With diarrhea and likely GI source, CT abdomen unremarkable -Urinalysis pending -Blood cultures ordered and pending -C. difficile and GI panel pending, recent use of antibiotics for presumably cellulitis -Continue IV Rocephin and Flagyl -Monitor lactic acid and CBC -Noted to have prior history of diarrhea with unknown etiology, may require GI  follow-up outpatient  AKI secondary to above -Baseline creatinine 0.7-0.9 -Related to sepsis physiology as well as dehydration -Continue hydration -Avoid nephrotoxic agents -Strict I's and O's -Repeat labs in a.m.  Mild hypokalemia -Replete and reevaluate in a.m.  History of hypertension -Hold home medications with ongoing sepsis physiology  History of depression -Continue home medications  GERD -H2 blocker  History of alcohol abuse -No signs of withdrawal currently noted -Drinks beer on a daily basis -CIWA protocol  Tobacco abuse -Counseled on cessation   DVT prophylaxis: Heparin Code Status: Full Family Communication: None at bedside Disposition Plan:Admit for evaluation of sepsis Consults called:None Admission status: Inpatient, Tele  Severity of Illness: The appropriate patient status for this patient is INPATIENT. Inpatient status is judged to be reasonable and necessary in order to provide the required intensity of service to ensure the patient's safety. The patient's presenting symptoms, physical exam findings,  and initial radiographic and laboratory data in the context of their chronic comorbidities is felt to place them at high risk for further clinical deterioration. Furthermore, it is not anticipated that the patient will be medically stable for discharge from the hospital within 2 midnights of admission.   * I certify that at the point of admission it is my clinical judgment that the patient will require inpatient hospital care spanning beyond 2 midnights from the point of admission due to high intensity of service, high risk for further deterioration and high frequency of surveillance required.*   Tynisa Vohs D Dreden Rivere DO [facility]  If 7PM-7AM, please contact night-coverage [email or web address]  12/30/2021, 12:10 PM

## 2021-12-31 DIAGNOSIS — R6520 Severe sepsis without septic shock: Secondary | ICD-10-CM | POA: Diagnosis not present

## 2021-12-31 DIAGNOSIS — A419 Sepsis, unspecified organism: Secondary | ICD-10-CM | POA: Diagnosis not present

## 2021-12-31 LAB — COMPREHENSIVE METABOLIC PANEL
ALT: 14 U/L (ref 0–44)
AST: 28 U/L (ref 15–41)
Albumin: 3 g/dL — ABNORMAL LOW (ref 3.5–5.0)
Alkaline Phosphatase: 47 U/L (ref 38–126)
Anion gap: 7 (ref 5–15)
BUN: 16 mg/dL (ref 6–20)
CO2: 18 mmol/L — ABNORMAL LOW (ref 22–32)
Calcium: 7.3 mg/dL — ABNORMAL LOW (ref 8.9–10.3)
Chloride: 113 mmol/L — ABNORMAL HIGH (ref 98–111)
Creatinine, Ser: 1.18 mg/dL (ref 0.61–1.24)
GFR, Estimated: >60 mL/min (ref >60)
Glucose, Bld: 96 mg/dL (ref 70–99)
Potassium: 3.1 mmol/L — ABNORMAL LOW (ref 3.5–5.1)
Sodium: 138 mmol/L (ref 135–145)
Total Bilirubin: 0.7 mg/dL (ref 0.3–1.2)
Total Protein: 5.6 g/dL — ABNORMAL LOW (ref 6.5–8.1)

## 2021-12-31 LAB — LACTIC ACID, PLASMA: Lactic Acid, Venous: 1.3 mmol/L (ref 0.5–1.9)

## 2021-12-31 LAB — CBC
HCT: 36 % — ABNORMAL LOW (ref 39.0–52.0)
Hemoglobin: 12.2 g/dL — ABNORMAL LOW (ref 13.0–17.0)
MCH: 36.3 pg — ABNORMAL HIGH (ref 26.0–34.0)
MCHC: 33.9 g/dL (ref 30.0–36.0)
MCV: 107.1 fL — ABNORMAL HIGH (ref 80.0–100.0)
Platelets: 146 10*3/uL — ABNORMAL LOW (ref 150–400)
RBC: 3.36 MIL/uL — ABNORMAL LOW (ref 4.22–5.81)
RDW: 13.1 % (ref 11.5–15.5)
WBC: 4.7 10*3/uL (ref 4.0–10.5)
nRBC: 0 % (ref 0.0–0.2)

## 2021-12-31 LAB — URINE CULTURE: Culture: NO GROWTH

## 2021-12-31 LAB — MAGNESIUM: Magnesium: 1.4 mg/dL — ABNORMAL LOW (ref 1.7–2.4)

## 2021-12-31 MED ORDER — ALUM & MAG HYDROXIDE-SIMETH 200-200-20 MG/5ML PO SUSP
30.0000 mL | Freq: Four times a day (QID) | ORAL | Status: DC | PRN
  Administered 2021-12-31: 30 mL via ORAL
  Filled 2021-12-31 (×2): qty 30

## 2021-12-31 MED ORDER — POTASSIUM CHLORIDE CRYS ER 20 MEQ PO TBCR
40.0000 meq | EXTENDED_RELEASE_TABLET | Freq: Two times a day (BID) | ORAL | Status: AC
Start: 2021-12-31 — End: 2021-12-31
  Administered 2021-12-31 (×2): 40 meq via ORAL
  Filled 2021-12-31 (×2): qty 2

## 2021-12-31 MED ORDER — MAGNESIUM SULFATE 2 GM/50ML IV SOLN
2.0000 g | Freq: Once | INTRAVENOUS | Status: AC
  Administered 2021-12-31: 2 g via INTRAVENOUS
  Filled 2021-12-31: qty 50

## 2021-12-31 MED ORDER — LACTATED RINGERS IV SOLN
INTRAVENOUS | Status: AC
Start: 2021-12-31 — End: 2022-01-01

## 2021-12-31 NOTE — Progress Notes (Signed)
  Transition of Care [facility]) Screening Note   Patient Details  Name: Eric Conley Date of Birth: [birth year]   Transition of Care [facility]) CM/SW Contact:    Villa Herb, LCSWA Phone Number: 12/31/2021, 11:21 AM  TOC consulted for substance use resources. CSW spoke with pt about interest in this. Pt states that he is already enrolled and does not need any other resources at this time.   Transition of Care Department [facility]) has reviewed patient and no TOC needs have been identified at this time. We will continue to monitor patient advancement through interdisciplinary progression rounds. If new patient transition needs arise, please place a TOC consult.

## 2021-12-31 NOTE — Progress Notes (Signed)
PROGRESS NOTE    Eric Conley  FIE:[id number] DOB: November 14, 1960 DOA: 12/30/2021 PCP: Assunta Found, MD   Brief Narrative:    Eric Conley is a 61 y.o. male with medical history significant for depression, hypertension, alcohol abuse, and tobacco abuse who presented to the ED with generalized weakness and diarrhea.  He was admitted for severe sepsis, present on admission due to GI source, but sepsis has now been ruled out.  He does not have C. difficile and GI pathogen panel is currently pending.  He was also noted to have AKI which is now resolving with IV fluid hydration.  He has no further complaints of diarrhea.  Assessment & Plan:   Principal Problem:   Severe sepsis (HCC) Active Problems:   Diarrhea   Severe recurrent major depression without psychotic features (HCC)   Alcohol use disorder, severe, dependence (HCC)   Dehydration   Essential hypertension  Assessment and Plan:   Severe sepsis, ruled out -Stool pathogen panel pending, C. difficile negative -Urine analysis and blood cultures negative -Discontinue IV Rocephin and Flagyl -Lactic acidosis resolved -Noted to have prior history of diarrhea with unknown etiology, may require GI follow-up outpatient   AKI secondary to above-resolved -Baseline creatinine 0.7-0.9 -Related to sepsis physiology as well as dehydration -Continue hydration -Avoid nephrotoxic agents -Strict I's and O's -Repeat labs in a.m.   Mild hypokalemia/hypomagnesemia -Replete and reevaluate in a.m.   History of hypertension -Hold home medications with ongoing sepsis physiology   History of depression -Continue home medications   GERD -H2 blocker   History of alcohol abuse -No signs of withdrawal currently noted -Drinks beer on a daily basis -CIWA protocol   Tobacco abuse -Counseled on cessation    DVT prophylaxis: Heparin Code Status: Full Family Communication: None at bedside Disposition Plan:  Status is: Inpatient Remains  inpatient appropriate because: Need for IV fluid   Consultants:  None  Procedures:  None  Antimicrobials:  Anti-infectives (From admission, onward)    Start     Dose/Rate Route Frequency Ordered Stop   12/31/21 0000  cefTRIAXone (ROCEPHIN) 2 g in sodium chloride 0.9 % 100 mL IVPB  Status:  Discontinued        2 g 200 mL/hr over 30 Minutes Intravenous Every 24 hours 12/30/21 1216 12/31/21 0655   12/30/21 0915  cefTRIAXone (ROCEPHIN) 1 g in sodium chloride 0.9 % 100 mL IVPB        1 g 200 mL/hr over 30 Minutes Intravenous  Once 12/30/21 0906 12/30/21 0954   12/30/21 0915  metroNIDAZOLE (FLAGYL) IVPB 500 mg  Status:  Discontinued        500 mg 100 mL/hr over 60 Minutes Intravenous Every 8 hours 12/30/21 0906 12/31/21 0655      Subjective: Patient seen and evaluated today with no further complaints of diarrhea or acute overnight events noted.  Objective: Vitals:   12/30/21 1651 12/30/21 2108 12/30/21 2353 12/31/21 0534  BP: 130/80 140/86 119/85 135/73  Pulse: 97 82 73 71  Resp: 19 19 18 14   Temp: 98.3 F (36.8 C) 98.6 F (37 C) 97.7 F (36.5 C) 97.9 F (36.6 C)  TempSrc:      SpO2: 100% 100% 100% 100%  Weight:      Height:        Intake/Output Summary (Last 24 hours) at 12/31/2021 1325 Last data filed at 12/31/2021 0900 Gross per 24 hour  Intake 600 ml  Output 675 ml  Net -75 ml   Filed  Weights   12/30/21 0812  Weight: 66.2 kg    Examination:  General exam: Appears calm and comfortable  Respiratory system: Clear to auscultation. Respiratory effort normal. Cardiovascular system: S1 & S2 heard, RRR.  Gastrointestinal system: Abdomen is soft Central nervous system: Alert and awake Extremities: No edema Skin: No significant lesions noted Psychiatry: Flat affect.    Data Reviewed: I have personally reviewed following labs and imaging studies  CBC: Recent Labs  Lab 12/30/21 0815 12/30/21 1248 12/31/21 0447  WBC 16.2* 10.0 4.7  NEUTROABS 13.8*  --    --   HGB 19.3* 15.3 12.2*  HCT 55.5* 43.7 36.0*  MCV 103.9* 105.8* 107.1*  PLT 288 191 146*   Basic Metabolic Panel: Recent Labs  Lab 12/30/21 0815 12/30/21 1248 12/31/21 0447  NA 137  --  138  K 3.2*  --  3.1*  CL 98  --  113*  CO2 16*  --  18*  GLUCOSE 176*  --  96  BUN 20  --  16  CREATININE 3.89* 2.96* 1.18  CALCIUM 10.0  --  7.3*  MG  --   --  1.4*   GFR: Estimated Creatinine Clearance: 63.9 mL/min (by C-G formula based on SCr of 1.18 mg/dL). Liver Function Tests: Recent Labs  Lab 12/30/21 0815 12/31/21 0447  AST 33 28  ALT 22 14  ALKPHOS 87 47  BILITOT 1.2 0.7  PROT 9.7* 5.6*  ALBUMIN 5.4* 3.0*   No results for input(s): "LIPASE", "AMYLASE" in the last 168 hours. Recent Labs  Lab 12/30/21 0932  AMMONIA 47*   Coagulation Profile: No results for input(s): "INR", "PROTIME" in the last 168 hours. Cardiac Enzymes: No results for input(s): "CKTOTAL", "CKMB", "CKMBINDEX", "TROPONINI" in the last 168 hours. BNP (last 3 results) No results for input(s): "PROBNP" in the last 8760 hours. HbA1C: No results for input(s): "HGBA1C" in the last 72 hours. CBG: No results for input(s): "GLUCAP" in the last 168 hours. Lipid Profile: No results for input(s): "CHOL", "HDL", "LDLCALC", "TRIG", "CHOLHDL", "LDLDIRECT" in the last 72 hours. Thyroid Function Tests: No results for input(s): "TSH", "T4TOTAL", "FREET4", "T3FREE", "THYROIDAB" in the last 72 hours. Anemia Panel: No results for input(s): "VITAMINB12", "FOLATE", "FERRITIN", "TIBC", "IRON", "RETICCTPCT" in the last 72 hours. Sepsis Labs: Recent Labs  Lab 12/30/21 0815 12/30/21 1051 12/31/21 0447  LATICACIDVEN 5.0* 4.1* 1.3    Recent Results (from the past 240 hour(s))  SARS Coronavirus 2 by RT PCR (hospital order, performed in [facility] hospital lab) *cepheid single result test* Anterior Nasal Swab     Status: None   Collection Time: 12/30/21  8:17 AM   Specimen: Anterior Nasal Swab  Result Value Ref  Range Status   SARS Coronavirus 2 by RT PCR NEGATIVE NEGATIVE Final    Comment: (NOTE) SARS-CoV-2 target nucleic acids are NOT DETECTED.  The SARS-CoV-2 RNA is generally detectable in upper and lower respiratory specimens during the acute phase of infection. The lowest concentration of SARS-CoV-2 viral copies this assay can detect is 250 copies / mL. A negative result does not preclude SARS-CoV-2 infection and should not be used as the sole basis for treatment or other patient management decisions.  A negative result may occur with improper specimen collection / handling, submission of specimen other than nasopharyngeal swab, presence of viral mutation(s) within the areas targeted by this assay, and inadequate number of viral copies (<250 copies / mL). A negative result must be combined with clinical observations, patient history, and epidemiological  information.  Fact Sheet for Patients:   RoadLapTop.co.za  Fact Sheet for Healthcare Providers: http://kim-miller.com/  This test is not yet approved or  cleared by the [place] FDA and has been authorized for detection and/or diagnosis of SARS-CoV-2 by FDA under an Emergency Use Authorization (EUA).  This EUA will remain in effect (meaning this test can be used) for the duration of the COVID-19 declaration under Section 564(b)(1) of the Act, 21 U.S.C. section 360bbb-3(b)(1), unless the authorization is terminated or revoked sooner.  Performed at [facility], [address], [place], [place] [address]   C Difficile Quick Screen w PCR reflex     Status: None   Collection Time: 12/30/21  8:55 AM   Specimen: Stool  Result Value Ref Range Status   C Diff antigen NEGATIVE NEGATIVE Final   C Diff toxin NEGATIVE NEGATIVE Final   C Diff interpretation No C. difficile detected.  Final    Comment: Performed at [facility], [address], [place], [place] [address]  Blood Culture (routine x  2)     Status: None (Preliminary result)   Collection Time: 12/30/21  9:05 AM   Specimen: BLOOD RIGHT ARM  Result Value Ref Range Status   Specimen Description BLOOD RIGHT ARM DRAWN BY RN  Final   Special Requests   Final    BOTTLES DRAWN AEROBIC AND ANAEROBIC Blood Culture adequate volume   Culture   Final    NO GROWTH < 24 HOURS Performed at [facility], [address] [place], [place] [address]    Report Status PENDING  Incomplete  Blood Culture (routine x 2)     Status: None (Preliminary result)   Collection Time: 12/30/21  9:32 AM   Specimen: BLOOD LEFT ARM  Result Value Ref Range Status   Specimen Description BLOOD LEFT ARM  Final   Special Requests   Final    BOTTLES DRAWN AEROBIC AND ANAEROBIC Blood Culture adequate volume   Culture   Final    NO GROWTH < 24 HOURS Performed at [facility], [address], [place], [place] [address]    Report Status PENDING  Incomplete  Urine Culture     Status: None   Collection Time: 12/30/21 11:58 AM   Specimen: Urine, Catheterized  Result Value Ref Range Status   Specimen Description   Final    URINE, CATHETERIZED Performed at [facility], [address] [place], [place] [address]    Special Requests   Final    NONE Performed at [facility], [address] [place], [place] [address]    Culture   Final    NO GROWTH Performed at [facility], [address], [place], [place] [address]    Report Status 12/31/2021 FINAL  Final         Radiology Studies: CT ABDOMEN PELVIS WO CONTRAST  Result Date: 12/30/2021 CLINICAL DATA:  Diarrhea, sepsis EXAM: CT ABDOMEN AND PELVIS WITHOUT CONTRAST TECHNIQUE: Multidetector CT imaging of the abdomen and pelvis was performed following the standard protocol without IV contrast. RADIATION DOSE REDUCTION: This exam was performed according to the departmental dose-optimization program which includes automated exposure control, adjustment of the mA and/or kV according to patient size  and/or use of iterative reconstruction technique. COMPARISON:  CT done on 07/26/2021 FINDINGS: Lower chest: There are no focal infiltrates in the visualized lower lung fields. Hepatobiliary: No focal abnormalities are seen in the liver. Gallbladder is unremarkable. There is no dilation of bile ducts. Pancreas: No  focal abnormalities are seen. Spleen: Unremarkable. Adrenals/Urinary Tract: Adrenals are unremarkable. There is no hydronephrosis. There are no renal or ureteral stones. Urinary bladder is not distended. Stomach/Bowel: Stomach is distended with fluid in the lumen. There is prominence of gastric mucosa, more so along the greater curvature aspect. There is fluid in the lumen of lower thoracic esophagus suggesting possible gastroesophageal reflux. There is no significant dilation of small bowel loops. Appendix is not dilated. There is no significant wall thickening in colon. There is no pericolic stranding. Vascular/Lymphatic: There are scattered arterial calcifications. Reproductive: Unremarkable. Left testis is not seen in the visualized portions of scrotum. Scrotum is not included in its entirety. Other: There is no ascites or pneumoperitoneum. Umbilical hernia containing fat is seen. Small bilateral inguinal hernias containing fat are seen. Musculoskeletal: No acute findings are seen. IMPRESSION: There is no evidence of small-bowel obstruction. There is no pneumoperitoneum. There is no hydronephrosis. Appendix is not dilated. There is prominence of mucosal folds in the stomach suggesting possible gastritis. There is moderate to marked distention of stomach. There is fluid in the lumen of the lower thoracic esophagus suggesting gastroesophageal reflux. Other findings as described in the body of the report. Electronically Signed   By: Ernie Avena M.D.   On: 12/30/2021 10:02   DG Chest Port 1 View  Result Date: 12/30/2021 CLINICAL DATA:  Generalized weakness, nausea, vomiting EXAM: PORTABLE CHEST  1 VIEW COMPARISON:  07/26/2021 FINDINGS: Cardiac size is within normal limits. There are no signs of pulmonary edema or focal pulmonary consolidation. Emphysematous changes are noted in the upper lung fields. There is a slight prominence of interstitial markings in the lower lung fields, possibly suggesting scarring with no interval change. There is no pleural effusion or pneumothorax. IMPRESSION: There are no new infiltrates or signs of pulmonary edema. Electronically Signed   By: Ernie Avena M.D.   On: 12/30/2021 08:33        Scheduled Meds:  escitalopram  5 mg Oral Daily   famotidine  20 mg Oral Daily   folic acid  1 mg Oral Daily   heparin  5,000 Units Subcutaneous Q8H   mirtazapine  7.5 mg Oral QHS   multivitamin with minerals  1 tablet Oral Daily   potassium chloride  40 mEq Oral BID   QUEtiapine  25 mg Oral QHS   thiamine  100 mg Oral Daily   Or   thiamine  100 mg Intravenous Daily     LOS: 1 day    Time spent: 35 minutes    Eric Sumlin Hoover Brunette, DO [facility]  If 7PM-7AM, please contact night-coverage [email or web address] 12/31/2021, 1:25 PM

## 2022-01-01 DIAGNOSIS — A419 Sepsis, unspecified organism: Secondary | ICD-10-CM | POA: Diagnosis not present

## 2022-01-01 DIAGNOSIS — R6520 Severe sepsis without septic shock: Secondary | ICD-10-CM | POA: Diagnosis not present

## 2022-01-01 LAB — BASIC METABOLIC PANEL
Anion gap: 4 — ABNORMAL LOW (ref 5–15)
BUN: 11 mg/dL (ref 6–20)
CO2: 23 mmol/L (ref 22–32)
Calcium: 7.5 mg/dL — ABNORMAL LOW (ref 8.9–10.3)
Chloride: 113 mmol/L — ABNORMAL HIGH (ref 98–111)
Creatinine, Ser: 0.66 mg/dL (ref 0.61–1.24)
GFR, Estimated: >60 mL/min (ref >60)
Glucose, Bld: 101 mg/dL — ABNORMAL HIGH (ref 70–99)
Potassium: 3.4 mmol/L — ABNORMAL LOW (ref 3.5–5.1)
Sodium: 140 mmol/L (ref 135–145)

## 2022-01-01 LAB — GASTROINTESTINAL PANEL BY PCR, STOOL (REPLACES STOOL CULTURE)

## 2022-01-01 LAB — CBC
HCT: 34.1 % — ABNORMAL LOW (ref 39.0–52.0)
Hemoglobin: 11.8 g/dL — ABNORMAL LOW (ref 13.0–17.0)
MCH: 36.2 pg — ABNORMAL HIGH (ref 26.0–34.0)
MCHC: 34.6 g/dL (ref 30.0–36.0)
MCV: 104.6 fL — ABNORMAL HIGH (ref 80.0–100.0)
Platelets: 161 10*3/uL (ref 150–400)
RBC: 3.26 MIL/uL — ABNORMAL LOW (ref 4.22–5.81)
RDW: 13 % (ref 11.5–15.5)
WBC: 3.2 10*3/uL — ABNORMAL LOW (ref 4.0–10.5)
nRBC: 0 % (ref 0.0–0.2)

## 2022-01-01 LAB — MAGNESIUM: Magnesium: 1.8 mg/dL (ref 1.7–2.4)

## 2022-01-01 MED ORDER — LOPERAMIDE HCL 2 MG PO CAPS
2.0000 mg | ORAL_CAPSULE | ORAL | Status: DC | PRN
  Administered 2022-01-01 – 2022-01-02 (×2): 2 mg via ORAL
  Filled 2022-01-01 (×2): qty 1

## 2022-01-01 MED ORDER — POTASSIUM CHLORIDE CRYS ER 20 MEQ PO TBCR
40.0000 meq | EXTENDED_RELEASE_TABLET | Freq: Once | ORAL | Status: AC
Start: 2022-01-01 — End: 2022-01-01
  Administered 2022-01-01: 40 meq via ORAL
  Filled 2022-01-01: qty 2

## 2022-01-01 MED ORDER — LACTATED RINGERS IV SOLN: INTRAVENOUS | Status: AC

## 2022-01-01 MED ORDER — POTASSIUM CHLORIDE CRYS ER 20 MEQ PO TBCR
40.0000 meq | EXTENDED_RELEASE_TABLET | Freq: Once | ORAL | Status: AC
  Administered 2022-01-01: 40 meq via ORAL
  Filled 2022-01-01: qty 2

## 2022-01-01 NOTE — Progress Notes (Signed)
PROGRESS NOTE    Eric Conley  UUV:[id number] DOB: 1961-04-06 DOA: 12/30/2021 PCP: Assunta Found, MD   Brief Narrative:    Eric Conley is a 61 y.o. male with medical history significant for depression, hypertension, alcohol abuse, and tobacco abuse who presented to the ED with generalized weakness and diarrhea.  He was admitted for severe sepsis, present on admission due to GI source, but sepsis has now been ruled out.  He does not have C. difficile and GI pathogen panel is currently pending.  He was also noted to have AKI which is now resolving with IV fluid hydration.  He continues to have loose bowel movements.  Assessment & Plan:   Principal Problem:   Severe sepsis (HCC) Active Problems:   Diarrhea   Severe recurrent major depression without psychotic features (HCC)   Alcohol use disorder, severe, dependence (HCC)   Dehydration   Essential hypertension  Assessment and Plan:  Severe sepsis, ruled out -Stool pathogen panel pending, C. difficile negative -Urine analysis and blood cultures negative -Discontinued IV Rocephin and Flagyl -Lactic acidosis resolved -Noted to have prior history of diarrhea with unknown etiology, continues to have loose stools with pathogen panel pending   AKI secondary to above-resolved -Baseline creatinine 0.7-0.9 -Related to sepsis physiology as well as dehydration -Continue hydration -Avoid nephrotoxic agents -Strict I's and O's -Repeat labs in a.m.   Mild hypokalemia/hypomagnesemia -Replete and reevaluate in a.m.   History of hypertension -Hold home medications with ongoing sepsis physiology   History of depression -Continue home medications   GERD -H2 blocker   History of alcohol abuse -No signs of withdrawal currently noted -Drinks beer on a daily basis -CIWA protocol   Tobacco abuse -Counseled on cessation     DVT prophylaxis: Heparin Code Status: Full Family Communication: None at bedside Disposition Plan:  Status  is: Inpatient Remains inpatient appropriate because: Need for IV fluid     Consultants:  None   Procedures:  None  Antimicrobials:  Anti-infectives (From admission, onward)    Start     Dose/Rate Route Frequency Ordered Stop   12/31/21 0000  cefTRIAXone (ROCEPHIN) 2 g in sodium chloride 0.9 % 100 mL IVPB  Status:  Discontinued        2 g 200 mL/hr over 30 Minutes Intravenous Every 24 hours 12/30/21 1216 12/31/21 0655   12/30/21 0915  cefTRIAXone (ROCEPHIN) 1 g in sodium chloride 0.9 % 100 mL IVPB        1 g 200 mL/hr over 30 Minutes Intravenous  Once 12/30/21 0906 12/30/21 0954   12/30/21 0915  metroNIDAZOLE (FLAGYL) IVPB 500 mg  Status:  Discontinued        500 mg 100 mL/hr over 60 Minutes Intravenous Every 8 hours 12/30/21 0906 12/31/21 0655       Subjective: Patient seen and evaluated today with 6 loose bowel movements noted today.  He denies any abdominal pain, nausea, or vomiting.  Objective: Vitals:   12/31/21 0534 12/31/21 1359 12/31/21 2059 01/01/22 0445  BP: 135/73 118/82 124/77 133/81  Pulse: 71 72 64 66  Resp: 14 18 20 19   Temp: 97.9 F (36.6 C) 97.8 F (36.6 C)  97.6 F (36.4 C)  TempSrc:      SpO2: 100% 100% 100% 100%  Weight:      Height:        Intake/Output Summary (Last 24 hours) at 01/01/2022 1344 Last data filed at 01/01/2022 0128 Gross per 24 hour  Intake 1064.71 ml  Output --  Net 1064.71 ml   Filed Weights   12/30/21 0812  Weight: 66.2 kg    Examination:  General exam: Appears calm and comfortable  Respiratory system: Clear to auscultation. Respiratory effort normal. Cardiovascular system: S1 & S2 heard, RRR.  Gastrointestinal system: Abdomen is soft Central nervous system: Alert and awake Extremities: No edema Skin: No significant lesions noted Psychiatry: Flat affect.    Data Reviewed: I have personally reviewed following labs and imaging studies  CBC: Recent Labs  Lab 12/30/21 0815 12/30/21 1248 12/31/21 0447  01/01/22 0621  WBC 16.2* 10.0 4.7 3.2*  NEUTROABS 13.8*  --   --   --   HGB 19.3* 15.3 12.2* 11.8*  HCT 55.5* 43.7 36.0* 34.1*  MCV 103.9* 105.8* 107.1* 104.6*  PLT 288 191 146* 161   Basic Metabolic Panel: Recent Labs  Lab 12/30/21 0815 12/30/21 1248 12/31/21 0447 01/01/22 0621  NA 137  --  138 140  K 3.2*  --  3.1* 3.4*  CL 98  --  113* 113*  CO2 16*  --  18* 23  GLUCOSE 176*  --  96 101*  BUN 20  --  16 11  CREATININE 3.89* 2.96* 1.18 0.66  CALCIUM 10.0  --  7.3* 7.5*  MG  --   --  1.4* 1.8   GFR: Estimated Creatinine Clearance: 94.2 mL/min (by C-G formula based on SCr of 0.66 mg/dL). Liver Function Tests: Recent Labs  Lab 12/30/21 0815 12/31/21 0447  AST 33 28  ALT 22 14  ALKPHOS 87 47  BILITOT 1.2 0.7  PROT 9.7* 5.6*  ALBUMIN 5.4* 3.0*   No results for input(s): "LIPASE", "AMYLASE" in the last 168 hours. Recent Labs  Lab 12/30/21 0932  AMMONIA 47*   Coagulation Profile: No results for input(s): "INR", "PROTIME" in the last 168 hours. Cardiac Enzymes: No results for input(s): "CKTOTAL", "CKMB", "CKMBINDEX", "TROPONINI" in the last 168 hours. BNP (last 3 results) No results for input(s): "PROBNP" in the last 8760 hours. HbA1C: No results for input(s): "HGBA1C" in the last 72 hours. CBG: No results for input(s): "GLUCAP" in the last 168 hours. Lipid Profile: No results for input(s): "CHOL", "HDL", "LDLCALC", "TRIG", "CHOLHDL", "LDLDIRECT" in the last 72 hours. Thyroid Function Tests: No results for input(s): "TSH", "T4TOTAL", "FREET4", "T3FREE", "THYROIDAB" in the last 72 hours. Anemia Panel: No results for input(s): "VITAMINB12", "FOLATE", "FERRITIN", "TIBC", "IRON", "RETICCTPCT" in the last 72 hours. Sepsis Labs: Recent Labs  Lab 12/30/21 0815 12/30/21 1051 12/31/21 0447  LATICACIDVEN 5.0* 4.1* 1.3    Recent Results (from the past 240 hour(s))  SARS Coronavirus 2 by RT PCR (hospital order, performed in [facility] hospital lab) *cepheid  single result test* Anterior Nasal Swab     Status: None   Collection Time: 12/30/21  8:17 AM   Specimen: Anterior Nasal Swab  Result Value Ref Range Status   SARS Coronavirus 2 by RT PCR NEGATIVE NEGATIVE Final    Comment: (NOTE) SARS-CoV-2 target nucleic acids are NOT DETECTED.  The SARS-CoV-2 RNA is generally detectable in upper and lower respiratory specimens during the acute phase of infection. The lowest concentration of SARS-CoV-2 viral copies this assay can detect is 250 copies / mL. A negative result does not preclude SARS-CoV-2 infection and should not be used as the sole basis for treatment or other patient management decisions.  A negative result may occur with improper specimen collection / handling, submission of specimen other than nasopharyngeal swab, presence of viral mutation(s)  within the areas targeted by this assay, and inadequate number of viral copies (<250 copies / mL). A negative result must be combined with clinical observations, patient history, and epidemiological information.  Fact Sheet for Patients:   RoadLapTop.co.za  Fact Sheet for Healthcare Providers: http://kim-miller.com/  This test is not yet approved or  cleared by the [place] FDA and has been authorized for detection and/or diagnosis of SARS-CoV-2 by FDA under an Emergency Use Authorization (EUA).  This EUA will remain in effect (meaning this test can be used) for the duration of the COVID-19 declaration under Section 564(b)(1) of the Act, 21 U.S.C. section 360bbb-3(b)(1), unless the authorization is terminated or revoked sooner.  Performed at [facility], [address] [place], [place] [address]   C Difficile Quick Screen w PCR reflex     Status: None   Collection Time: 12/30/21  8:55 AM   Specimen: Stool  Result Value Ref Range Status   C Diff antigen NEGATIVE NEGATIVE Final   C Diff toxin NEGATIVE NEGATIVE Final   C Diff  interpretation No C. difficile detected.  Final    Comment: Performed at [facility], [address] [place], [place] [address]  Blood Culture (routine x 2)     Status: None (Preliminary result)   Collection Time: 12/30/21  9:05 AM   Specimen: BLOOD RIGHT ARM  Result Value Ref Range Status   Specimen Description BLOOD RIGHT ARM DRAWN BY RN  Final   Special Requests   Final    BOTTLES DRAWN AEROBIC AND ANAEROBIC Blood Culture adequate volume   Culture   Final    NO GROWTH 2 DAYS Performed at [facility], [address], [place], [place] [address]    Report Status PENDING  Incomplete  Blood Culture (routine x 2)     Status: None (Preliminary result)   Collection Time: 12/30/21  9:32 AM   Specimen: BLOOD LEFT ARM  Result Value Ref Range Status   Specimen Description BLOOD LEFT ARM  Final   Special Requests   Final    BOTTLES DRAWN AEROBIC AND ANAEROBIC Blood Culture adequate volume   Culture   Final    NO GROWTH 2 DAYS Performed at [facility], [address], [place], [place] [address]    Report Status PENDING  Incomplete  Urine Culture     Status: None   Collection Time: 12/30/21 11:58 AM   Specimen: Urine, Catheterized  Result Value Ref Range Status   Specimen Description   Final    URINE, CATHETERIZED Performed at [facility], [address], [place], [place] [address]    Special Requests   Final    NONE Performed at [facility], [address] [place], [place] [address]    Culture   Final    NO GROWTH Performed at [facility], [address] [place], [place] [address]    Report Status 12/31/2021 FINAL  Final         Radiology Studies: No results found.      Scheduled Meds:  famotidine  20 mg Oral Daily   folic acid  1 mg Oral Daily   heparin  5,000 Units Subcutaneous Q8H   multivitamin with minerals  1 tablet Oral Daily   potassium chloride  40 mEq Oral Once   thiamine  100 mg Oral Daily   Or   thiamine  100 mg Intravenous Daily    Continuous Infusions:  lactated ringers       LOS:  2 days    Time spent: 35 minutes    Leonarda Leis Hoover Brunette, DO [facility]  If 7PM-7AM, please contact night-coverage [email or web address] 01/01/2022, 1:44 PM

## 2022-01-02 DIAGNOSIS — A419 Sepsis, unspecified organism: Secondary | ICD-10-CM | POA: Diagnosis not present

## 2022-01-02 DIAGNOSIS — R6520 Severe sepsis without septic shock: Secondary | ICD-10-CM | POA: Diagnosis not present

## 2022-01-02 LAB — MAGNESIUM: Magnesium: 1.6 mg/dL — ABNORMAL LOW (ref 1.7–2.4)

## 2022-01-02 LAB — BASIC METABOLIC PANEL
Anion gap: 6 (ref 5–15)
BUN: 7 mg/dL (ref 6–20)
CO2: 25 mmol/L (ref 22–32)
Calcium: 7.6 mg/dL — ABNORMAL LOW (ref 8.9–10.3)
Chloride: 107 mmol/L (ref 98–111)
Creatinine, Ser: 0.64 mg/dL (ref 0.61–1.24)
GFR, Estimated: >60 mL/min (ref >60)
Glucose, Bld: 94 mg/dL (ref 70–99)
Potassium: 3.6 mmol/L (ref 3.5–5.1)
Sodium: 138 mmol/L (ref 135–145)

## 2022-01-02 LAB — CBC
HCT: 34.5 % — ABNORMAL LOW (ref 39.0–52.0)
Hemoglobin: 12 g/dL — ABNORMAL LOW (ref 13.0–17.0)
MCH: 36.8 pg — ABNORMAL HIGH (ref 26.0–34.0)
MCHC: 34.8 g/dL (ref 30.0–36.0)
MCV: 105.8 fL — ABNORMAL HIGH (ref 80.0–100.0)
Platelets: 178 10*3/uL (ref 150–400)
RBC: 3.26 MIL/uL — ABNORMAL LOW (ref 4.22–5.81)
RDW: 12.9 % (ref 11.5–15.5)
WBC: 3.7 10*3/uL — ABNORMAL LOW (ref 4.0–10.5)
nRBC: 0 % (ref 0.0–0.2)

## 2022-01-02 MED ORDER — MAGNESIUM SULFATE 2 GM/50ML IV SOLN
2.0000 g | Freq: Once | INTRAVENOUS | Status: AC
  Administered 2022-01-02: 2 g via INTRAVENOUS
  Filled 2022-01-02: qty 50

## 2022-01-02 MED ORDER — POTASSIUM CHLORIDE CRYS ER 20 MEQ PO TBCR
40.0000 meq | EXTENDED_RELEASE_TABLET | Freq: Once | ORAL | Status: AC
  Administered 2022-01-02: 40 meq via ORAL
  Filled 2022-01-02: qty 2

## 2022-01-02 MED ORDER — LOPERAMIDE HCL 2 MG PO TABS: 2.0000 mg | ORAL_TABLET | Freq: Four times a day (QID) | ORAL | 0 refills | Status: DC | PRN

## 2022-01-02 NOTE — Discharge Summary (Signed)
Physician Discharge Summary  Eric Conley:[id number] DOB: [birth year] DOA: 12/30/2021  PCP: Assunta Found, MD  Admit date: 12/30/2021  Discharge date: 01/02/2022  Admitted From:Home  Disposition:  Home  Recommendations for Outpatient Follow-up:  Follow up with PCP in 1-2 weeks Continue on Imodium as prescribed for any issues with ongoing diarrhea Continue other home medications as prior  Home Health: None  Equipment/Devices: None  Discharge Condition:Stable  CODE STATUS: Full  Diet recommendation: Heart Healthy  Brief/Interim Summary: Eric Conley is a 61 y.o. male with medical history significant for depression, hypertension, alcohol abuse, and tobacco abuse who presented to the ED with generalized weakness and diarrhea.  He was admitted for severe sepsis, present on admission due to GI source, but sepsis has now been ruled out.  He does not have C. difficile and GI pathogen panel eventually revealed the presence of enteropathogenic E. coli infection.  He had required several days of IV fluid due to ongoing loose bowel movements as well as potassium and magnesium supplementation.  He is currently in stable condition for discharge and only has very limited bowel movements of approximately 2 or 3/day which can easily be managed at home with Imodium.  No other acute events noted.  He has been counseled on reducing his fast food intake which was a likely source.  Discharge Diagnoses:  Principal Problem:   Severe sepsis (HCC) Active Problems:   Diarrhea   Severe recurrent major depression without psychotic features (HCC)   Alcohol use disorder, severe, dependence (HCC)   Dehydration   Essential hypertension  Principal discharge diagnosis: Enteropathogenic E. coli gastroenteritis with associated prerenal AKI and electrolyte abnormalities.  Severe sepsis-ruled out.  Discharge Instructions  Discharge Instructions     Diet - low sodium heart healthy   Complete by: As directed     Enteric precautions (UV disinfection)   Complete by: As directed    Increase activity slowly   Complete by: As directed       Allergies as of 01/02/2022       Reactions   Codeine    Poison Sumac Extract         Medication List     STOP taking these medications    predniSONE 10 MG tablet Commonly known as: DELTASONE       TAKE these medications    diazepam 10 MG tablet Commonly known as: VALIUM Take 10 mg by mouth 2 (two) times daily.   loperamide 2 MG tablet Commonly known as: Imodium A-D Take 1 tablet (2 mg total) by mouth 4 (four) times daily as needed for diarrhea or loose stools.   NEXIUM PO Take 1 tablet by mouth daily as needed (indigestion).   POTASSIMIN PO Take 1 tablet by mouth daily.   triamcinolone cream 0.1 % Commonly known as: KENALOG Apply 1 Application topically daily as needed for rash.        Follow-up Information     Assunta Found, MD. Schedule an appointment as soon as possible for a visit in 1 week(s).   Specialty: Family Medicine Contact information: [address] [place] [address] [phone]                Allergies  Allergen Reactions   Codeine    Poison Sumac Extract     Consultations: None   Procedures/Studies: CT ABDOMEN PELVIS WO CONTRAST  Result Date: 12/30/2021 CLINICAL DATA:  Diarrhea, sepsis EXAM: CT ABDOMEN AND PELVIS WITHOUT CONTRAST TECHNIQUE: Multidetector CT imaging of the  abdomen and pelvis was performed following the standard protocol without IV contrast. RADIATION DOSE REDUCTION: This exam was performed according to the departmental dose-optimization program which includes automated exposure control, adjustment of the mA and/or kV according to patient size and/or use of iterative reconstruction technique. COMPARISON:  CT done on 07/26/2021 FINDINGS: Lower chest: There are no focal infiltrates in the visualized lower lung fields. Hepatobiliary: No focal abnormalities are seen in the  liver. Gallbladder is unremarkable. There is no dilation of bile ducts. Pancreas: No focal abnormalities are seen. Spleen: Unremarkable. Adrenals/Urinary Tract: Adrenals are unremarkable. There is no hydronephrosis. There are no renal or ureteral stones. Urinary bladder is not distended. Stomach/Bowel: Stomach is distended with fluid in the lumen. There is prominence of gastric mucosa, more so along the greater curvature aspect. There is fluid in the lumen of lower thoracic esophagus suggesting possible gastroesophageal reflux. There is no significant dilation of small bowel loops. Appendix is not dilated. There is no significant wall thickening in colon. There is no pericolic stranding. Vascular/Lymphatic: There are scattered arterial calcifications. Reproductive: Unremarkable. Left testis is not seen in the visualized portions of scrotum. Scrotum is not included in its entirety. Other: There is no ascites or pneumoperitoneum. Umbilical hernia containing fat is seen. Small bilateral inguinal hernias containing fat are seen. Musculoskeletal: No acute findings are seen. IMPRESSION: There is no evidence of small-bowel obstruction. There is no pneumoperitoneum. There is no hydronephrosis. Appendix is not dilated. There is prominence of mucosal folds in the stomach suggesting possible gastritis. There is moderate to marked distention of stomach. There is fluid in the lumen of the lower thoracic esophagus suggesting gastroesophageal reflux. Other findings as described in the body of the report. Electronically Signed   By: Ernie Avena M.D.   On: 12/30/2021 10:02   DG Chest Port 1 View  Result Date: 12/30/2021 CLINICAL DATA:  Generalized weakness, nausea, vomiting EXAM: PORTABLE CHEST 1 VIEW COMPARISON:  07/26/2021 FINDINGS: Cardiac size is within normal limits. There are no signs of pulmonary edema or focal pulmonary consolidation. Emphysematous changes are noted in the upper lung fields. There is a slight  prominence of interstitial markings in the lower lung fields, possibly suggesting scarring with no interval change. There is no pleural effusion or pneumothorax. IMPRESSION: There are no new infiltrates or signs of pulmonary edema. Electronically Signed   By: Ernie Avena M.D.   On: 12/30/2021 08:33     Discharge Exam: Vitals:   01/01/22 2135 01/02/22 0544  BP: 129/75 (!) 141/90  Pulse: 71 (!) 58  Resp: 16 19  Temp: 98.3 F (36.8 C) 98 F (36.7 C)  SpO2: 98% 98%   Vitals:   01/01/22 0445 01/01/22 1346 01/01/22 2135 01/02/22 0544  BP: 133/81 (!) 145/91 129/75 (!) 141/90  Pulse: 66 (!) 59 71 (!) 58  Resp: 19 17 16 19   Temp: 97.6 F (36.4 C)  98.3 F (36.8 C) 98 F (36.7 C)  TempSrc:   Oral Oral  SpO2: 100% 100% 98% 98%  Weight:      Height:        General: Pt is alert, awake, not in acute distress Cardiovascular: RRR, S1/S2 +, no rubs, no gallops Respiratory: CTA bilaterally, no wheezing, no rhonchi Abdominal: Soft, NT, ND, bowel sounds + Extremities: no edema, no cyanosis    The results of significant diagnostics from this hospitalization (including imaging, microbiology, ancillary and laboratory) are listed below for reference.     Microbiology: Recent Results (from the past  240 hour(s))  SARS Coronavirus 2 by RT PCR (hospital order, performed in [facility] hospital lab) *cepheid single result test* Anterior Nasal Swab     Status: None   Collection Time: 12/30/21  8:17 AM   Specimen: Anterior Nasal Swab  Result Value Ref Range Status   SARS Coronavirus 2 by RT PCR NEGATIVE NEGATIVE Final    Comment: (NOTE) SARS-CoV-2 target nucleic acids are NOT DETECTED.  The SARS-CoV-2 RNA is generally detectable in upper and lower respiratory specimens during the acute phase of infection. The lowest concentration of SARS-CoV-2 viral copies this assay can detect is 250 copies / mL. A negative result does not preclude SARS-CoV-2 infection and should not be used as the  sole basis for treatment or other patient management decisions.  A negative result may occur with improper specimen collection / handling, submission of specimen other than nasopharyngeal swab, presence of viral mutation(s) within the areas targeted by this assay, and inadequate number of viral copies (<250 copies / mL). A negative result must be combined with clinical observations, patient history, and epidemiological information.  Fact Sheet for Patients:   RoadLapTop.co.za  Fact Sheet for Healthcare Providers: http://kim-miller.com/  This test is not yet approved or  cleared by the [place] FDA and has been authorized for detection and/or diagnosis of SARS-CoV-2 by FDA under an Emergency Use Authorization (EUA).  This EUA will remain in effect (meaning this test can be used) for the duration of the COVID-19 declaration under Section 564(b)(1) of the Act, 21 U.S.C. section 360bbb-3(b)(1), unless the authorization is terminated or revoked sooner.  Performed at [facility], [address] [place], [place] [address]   C Difficile Quick Screen w PCR reflex     Status: None   Collection Time: 12/30/21  8:55 AM   Specimen: Stool  Result Value Ref Range Status   C Diff antigen NEGATIVE NEGATIVE Final   C Diff toxin NEGATIVE NEGATIVE Final   C Diff interpretation No C. difficile detected.  Final    Comment: Performed at [facility], [address], [place], [place] [address]  Blood Culture (routine x 2)     Status: None (Preliminary result)   Collection Time: 12/30/21  9:05 AM   Specimen: BLOOD RIGHT ARM  Result Value Ref Range Status   Specimen Description BLOOD RIGHT ARM DRAWN BY RN  Final   Special Requests   Final    BOTTLES DRAWN AEROBIC AND ANAEROBIC Blood Culture adequate volume   Culture   Final    NO GROWTH 3 DAYS Performed at [facility], [address], [place], [place] [address]    Report Status PENDING  Incomplete   Blood Culture (routine x 2)     Status: None (Preliminary result)   Collection Time: 12/30/21  9:32 AM   Specimen: BLOOD LEFT ARM  Result Value Ref Range Status   Specimen Description BLOOD LEFT ARM  Final   Special Requests   Final    BOTTLES DRAWN AEROBIC AND ANAEROBIC Blood Culture adequate volume   Culture   Final    NO GROWTH 3 DAYS Performed at [facility], [address] [place], [place] [address]    Report Status PENDING  Incomplete  Gastrointestinal Panel by PCR , Stool     Status: Abnormal   Collection Time: 12/30/21 11:55 AM   Specimen: Stool  Result Value Ref Range Status   Campylobacter species NOT DETECTED NOT DETECTED Final   Plesimonas shigelloides NOT DETECTED NOT DETECTED Final  Salmonella species NOT DETECTED NOT DETECTED Final   Yersinia enterocolitica NOT DETECTED NOT DETECTED Final   Vibrio species NOT DETECTED NOT DETECTED Final   Vibrio cholerae NOT DETECTED NOT DETECTED Final   Enteroaggregative E coli (EAEC) NOT DETECTED NOT DETECTED Final   Enteropathogenic E coli (EPEC) DETECTED (A) NOT DETECTED Final    Comment: RESULT CALLED TO, READ BACK BY AND VERIFIED WITH: TORI LEONARD ON 01/01/22 AT 1443 QSD    Enterotoxigenic E coli (ETEC) NOT DETECTED NOT DETECTED Final   Shiga like toxin producing E coli (STEC) NOT DETECTED NOT DETECTED Final   Shigella/Enteroinvasive E coli (EIEC) NOT DETECTED NOT DETECTED Final   Cryptosporidium NOT DETECTED NOT DETECTED Final   Cyclospora cayetanensis NOT DETECTED NOT DETECTED Final   Entamoeba histolytica NOT DETECTED NOT DETECTED Final   Giardia lamblia NOT DETECTED NOT DETECTED Final   Adenovirus F40/41 NOT DETECTED NOT DETECTED Final   Astrovirus NOT DETECTED NOT DETECTED Final   Norovirus GI/GII NOT DETECTED NOT DETECTED Final   Rotavirus A NOT DETECTED NOT DETECTED Final   Sapovirus (I, II, IV, and V) NOT DETECTED NOT DETECTED Final    Comment: Performed at [facility], [address],  [place], [place] [address]  Urine Culture     Status: None   Collection Time: 12/30/21 11:58 AM   Specimen: Urine, Catheterized  Result Value Ref Range Status   Specimen Description   Final    URINE, CATHETERIZED Performed at [facility], [address] [place], [place] [address]    Special Requests   Final    NONE Performed at [facility], [address] [place], [place] [address]    Culture   Final    NO GROWTH Performed at [facility], [address] [place], [place] [address]    Report Status 12/31/2021 FINAL  Final     Labs: BNP (last 3 results) Recent Labs    01/25/21 1125  BNP 8.0   Basic Metabolic Panel: Recent Labs  Lab 12/30/21 0815 12/30/21 1248 12/31/21 0447 01/01/22 0621 01/02/22 0559  NA 137  --  138 140 138  K 3.2*  --  3.1* 3.4* 3.6  CL 98  --  113* 113* 107  CO2 16*  --  18* 23 25  GLUCOSE 176*  --  96 101* 94  BUN 20  --  16 11 7   CREATININE 3.89* 2.96* 1.18 0.66 0.64  CALCIUM 10.0  --  7.3* 7.5* 7.6*  MG  --   --  1.4* 1.8 1.6*   Liver Function Tests: Recent Labs  Lab 12/30/21 0815 12/31/21 0447  AST 33 28  ALT 22 14  ALKPHOS 87 47  BILITOT 1.2 0.7  PROT 9.7* 5.6*  ALBUMIN 5.4* 3.0*   No results for input(s): "LIPASE", "AMYLASE" in the last 168 hours. Recent Labs  Lab 12/30/21 0932  AMMONIA 47*   CBC: Recent Labs  Lab 12/30/21 0815 12/30/21 1248 12/31/21 0447 01/01/22 0621 01/02/22 0559  WBC 16.2* 10.0 4.7 3.2* 3.7*  NEUTROABS 13.8*  --   --   --   --   HGB 19.3* 15.3 12.2* 11.8* 12.0*  HCT 55.5* 43.7 36.0* 34.1* 34.5*  MCV 103.9* 105.8* 107.1* 104.6* 105.8*  PLT 288 191 146* 161 178   Cardiac Enzymes: No results for input(s): "CKTOTAL", "CKMB", "CKMBINDEX", "TROPONINI" in the last 168 hours. BNP: Invalid input(s): "POCBNP" CBG: No results for input(s): "GLUCAP" in the last 168 hours. D-Dimer No results  for input(s): "DDIMER" in the last 72 hours. Hgb A1c No results for input(s): "HGBA1C" in the last 72  hours. Lipid Profile No results for input(s): "CHOL", "HDL", "LDLCALC", "TRIG", "CHOLHDL", "LDLDIRECT" in the last 72 hours. Thyroid function studies No results for input(s): "TSH", "T4TOTAL", "T3FREE", "THYROIDAB" in the last 72 hours.  Invalid input(s): "FREET3" Anemia work up No results for input(s): "VITAMINB12", "FOLATE", "FERRITIN", "TIBC", "IRON", "RETICCTPCT" in the last 72 hours. Urinalysis    Component Value Date/Time   COLORURINE AMBER (A) 12/30/2021 1158   APPEARANCEUR CLOUDY (A) 12/30/2021 1158   LABSPEC 1.017 12/30/2021 1158   PHURINE 5.0 12/30/2021 1158   GLUCOSEU NEGATIVE 12/30/2021 1158   HGBUR LARGE (A) 12/30/2021 1158   BILIRUBINUR NEGATIVE 12/30/2021 1158   KETONESUR NEGATIVE 12/30/2021 1158   PROTEINUR >=300 (A) 12/30/2021 1158   NITRITE NEGATIVE 12/30/2021 1158   LEUKOCYTESUR NEGATIVE 12/30/2021 1158   Sepsis Labs Recent Labs  Lab 12/30/21 1248 12/31/21 0447 01/01/22 0621 01/02/22 0559  WBC 10.0 4.7 3.2* 3.7*   Microbiology Recent Results (from the past 240 hour(s))  SARS Coronavirus 2 by RT PCR (hospital order, performed in [facility] hospital lab) *cepheid single result test* Anterior Nasal Swab     Status: None   Collection Time: 12/30/21  8:17 AM   Specimen: Anterior Nasal Swab  Result Value Ref Range Status   SARS Coronavirus 2 by RT PCR NEGATIVE NEGATIVE Final    Comment: (NOTE) SARS-CoV-2 target nucleic acids are NOT DETECTED.  The SARS-CoV-2 RNA is generally detectable in upper and lower respiratory specimens during the acute phase of infection. The lowest concentration of SARS-CoV-2 viral copies this assay can detect is 250 copies / mL. A negative result does not preclude SARS-CoV-2 infection and should not be used as the sole basis for treatment or other patient management decisions.  A negative result may occur with improper specimen collection / handling, submission of specimen other than nasopharyngeal swab, presence of viral  mutation(s) within the areas targeted by this assay, and inadequate number of viral copies (<250 copies / mL). A negative result must be combined with clinical observations, patient history, and epidemiological information.  Fact Sheet for Patients:   RoadLapTop.co.zahttps://www.fda.gov/media/158405/download  Fact Sheet for Healthcare Providers: http://kim-miller.com/https://www.fda.gov/media/158404/download  This test is not yet approved or  cleared by the [place] FDA and has been authorized for detection and/or diagnosis of SARS-CoV-2 by FDA under an Emergency Use Authorization (EUA).  This EUA will remain in effect (meaning this test can be used) for the duration of the COVID-19 declaration under Section 564(b)(1) of the Act, 21 U.S.C. section 360bbb-3(b)(1), unless the authorization is terminated or revoked sooner.  Performed at [facility], [address] [place], [place] [id number]   C Difficile Quick Screen w PCR reflex     Status: None   Collection Time: 12/30/21  8:55 AM   Specimen: Stool  Result Value Ref Range Status   C Diff antigen NEGATIVE NEGATIVE Final   C Diff toxin NEGATIVE NEGATIVE Final   C Diff interpretation No C. difficile detected.  Final    Comment: Performed at [facility], [address] [place], [place] [id number]  Blood Culture (routine x 2)     Status: None (Preliminary result)   Collection Time: 12/30/21  9:05 AM   Specimen: BLOOD RIGHT ARM  Result Value Ref Range Status   Specimen Description BLOOD RIGHT ARM DRAWN BY RN  Final   Special Requests   Final    BOTTLES  DRAWN AEROBIC AND ANAEROBIC Blood Culture adequate volume   Culture   Final    NO GROWTH 3 DAYS Performed at [facility], [address], [place], [place] [address]    Report Status PENDING  Incomplete  Blood Culture (routine x 2)     Status: None (Preliminary result)   Collection Time: 12/30/21  9:32 AM   Specimen: BLOOD LEFT ARM  Result Value Ref Range Status   Specimen Description BLOOD LEFT ARM  Final    Special Requests   Final    BOTTLES DRAWN AEROBIC AND ANAEROBIC Blood Culture adequate volume   Culture   Final    NO GROWTH 3 DAYS Performed at [facility], [address], [place], [place] [address]    Report Status PENDING  Incomplete  Gastrointestinal Panel by PCR , Stool     Status: Abnormal   Collection Time: 12/30/21 11:55 AM   Specimen: Stool  Result Value Ref Range Status   Campylobacter species NOT DETECTED NOT DETECTED Final   Plesimonas shigelloides NOT DETECTED NOT DETECTED Final   Salmonella species NOT DETECTED NOT DETECTED Final   Yersinia enterocolitica NOT DETECTED NOT DETECTED Final   Vibrio species NOT DETECTED NOT DETECTED Final   Vibrio cholerae NOT DETECTED NOT DETECTED Final   Enteroaggregative E coli (EAEC) NOT DETECTED NOT DETECTED Final   Enteropathogenic E coli (EPEC) DETECTED (A) NOT DETECTED Final    Comment: RESULT CALLED TO, READ BACK BY AND VERIFIED WITH: TORI LEONARD ON 01/01/22 AT 1443 QSD    Enterotoxigenic E coli (ETEC) NOT DETECTED NOT DETECTED Final   Shiga like toxin producing E coli (STEC) NOT DETECTED NOT DETECTED Final   Shigella/Enteroinvasive E coli (EIEC) NOT DETECTED NOT DETECTED Final   Cryptosporidium NOT DETECTED NOT DETECTED Final   Cyclospora cayetanensis NOT DETECTED NOT DETECTED Final   Entamoeba histolytica NOT DETECTED NOT DETECTED Final   Giardia lamblia NOT DETECTED NOT DETECTED Final   Adenovirus F40/41 NOT DETECTED NOT DETECTED Final   Astrovirus NOT DETECTED NOT DETECTED Final   Norovirus GI/GII NOT DETECTED NOT DETECTED Final   Rotavirus A NOT DETECTED NOT DETECTED Final   Sapovirus (I, II, IV, and V) NOT DETECTED NOT DETECTED Final    Comment: Performed at [facility], [address], [place], [place] [address]  Urine Culture     Status: None   Collection Time: 12/30/21 11:58 AM   Specimen: Urine, Catheterized  Result Value Ref Range Status   Specimen Description   Final    URINE, CATHETERIZED Performed  at [facility], [address], [place], [place] [address]    Special Requests   Final    NONE Performed at [facility], [address] [place], [place] [address]    Culture   Final    NO GROWTH Performed at [facility], [address] [place], [place] [address]    Report Status 12/31/2021 FINAL  Final     Time coordinating discharge: 35 minutes  SIGNED:   Erick Blinks, DO [facility] 01/02/2022, 9:14 AM  If 7PM-7AM, please contact night-coverage [email or web address]

## 2022-01-04 LAB — CULTURE, BLOOD (ROUTINE X 2)
Culture: NO GROWTH
Culture: NO GROWTH
Special Requests: ADEQUATE
Special Requests: ADEQUATE

## 2022-01-24 ENCOUNTER — Other Ambulatory Visit: Payer: Self-pay

## 2022-01-24 ENCOUNTER — Emergency Department (HOSPITAL_COMMUNITY): Payer: 59

## 2022-01-24 ENCOUNTER — Inpatient Hospital Stay (HOSPITAL_COMMUNITY)
Admission: EM | Admit: 2022-01-24 | Discharge: 2022-02-01 | DRG: 083 | Disposition: A | Discharge status: Home / Self Care | Payer: 59 | Attending: Neurosurgery | Admitting: Neurosurgery

## 2022-01-24 ENCOUNTER — Encounter (HOSPITAL_COMMUNITY): Payer: Self-pay

## 2022-01-24 DIAGNOSIS — R402142 Coma scale, eyes open, spontaneous, at arrival to emergency department: Secondary | ICD-10-CM | POA: Diagnosis present

## 2022-01-24 DIAGNOSIS — Z79899 Other long term (current) drug therapy: Secondary | ICD-10-CM | POA: Diagnosis not present

## 2022-01-24 DIAGNOSIS — Z885 Allergy status to narcotic agent status: Secondary | ICD-10-CM

## 2022-01-24 DIAGNOSIS — K219 Gastro-esophageal reflux disease without esophagitis: Secondary | ICD-10-CM | POA: Diagnosis present

## 2022-01-24 DIAGNOSIS — E876 Hypokalemia: Secondary | ICD-10-CM | POA: Diagnosis present

## 2022-01-24 DIAGNOSIS — S064XAA Epidural hemorrhage with loss of consciousness status unknown, initial encounter: Secondary | ICD-10-CM | POA: Diagnosis present

## 2022-01-24 DIAGNOSIS — G43909 Migraine, unspecified, not intractable, without status migrainosus: Secondary | ICD-10-CM | POA: Diagnosis present

## 2022-01-24 DIAGNOSIS — Y990 Civilian activity done for income or pay: Secondary | ICD-10-CM

## 2022-01-24 DIAGNOSIS — R402362 Coma scale, best motor response, obeys commands, at arrival to emergency department: Secondary | ICD-10-CM | POA: Diagnosis present

## 2022-01-24 DIAGNOSIS — W1830XA Fall on same level, unspecified, initial encounter: Secondary | ICD-10-CM | POA: Diagnosis present

## 2022-01-24 DIAGNOSIS — R402252 Coma scale, best verbal response, oriented, at arrival to emergency department: Secondary | ICD-10-CM | POA: Diagnosis present

## 2022-01-24 DIAGNOSIS — N179 Acute kidney failure, unspecified: Secondary | ICD-10-CM | POA: Diagnosis present

## 2022-01-24 DIAGNOSIS — F1010 Alcohol abuse, uncomplicated: Secondary | ICD-10-CM | POA: Diagnosis present

## 2022-01-24 DIAGNOSIS — F17210 Nicotine dependence, cigarettes, uncomplicated: Secondary | ICD-10-CM | POA: Diagnosis present

## 2022-01-24 DIAGNOSIS — Z91048 Other nonmedicinal substance allergy status: Secondary | ICD-10-CM

## 2022-01-24 DIAGNOSIS — S42111A Displaced fracture of body of scapula, right shoulder, initial encounter for closed fracture: Secondary | ICD-10-CM | POA: Diagnosis present

## 2022-01-24 DIAGNOSIS — S065XAA Traumatic subdural hemorrhage with loss of consciousness status unknown, initial encounter: Secondary | ICD-10-CM | POA: Diagnosis present

## 2022-01-24 DIAGNOSIS — S065X9A Traumatic subdural hemorrhage with loss of consciousness of unspecified duration, initial encounter: Principal | ICD-10-CM | POA: Diagnosis present

## 2022-01-24 DIAGNOSIS — S066X9A Traumatic subarachnoid hemorrhage with loss of consciousness of unspecified duration, initial encounter: Secondary | ICD-10-CM | POA: Diagnosis present

## 2022-01-24 DIAGNOSIS — E871 Hypo-osmolality and hyponatremia: Secondary | ICD-10-CM | POA: Diagnosis present

## 2022-01-24 DIAGNOSIS — I10 Essential (primary) hypertension: Secondary | ICD-10-CM | POA: Diagnosis present

## 2022-01-24 LAB — RAPID URINE DRUG SCREEN, HOSP PERFORMED
Amphetamines: NOT DETECTED
Barbiturates: NOT DETECTED
Benzodiazepines: POSITIVE — AB
Cocaine: NOT DETECTED
Opiates: NOT DETECTED
Tetrahydrocannabinol: NOT DETECTED

## 2022-01-24 LAB — URINALYSIS, ROUTINE W REFLEX MICROSCOPIC
Bacteria, UA: NONE SEEN
Bilirubin Urine: NEGATIVE
Glucose, UA: NEGATIVE mg/dL
Ketones, ur: NEGATIVE mg/dL
Leukocytes,Ua: NEGATIVE
Nitrite: NEGATIVE
Protein, ur: 30 mg/dL — AB
Specific Gravity, Urine: >1.046 — ABNORMAL HIGH (ref 1.005–1.030)
pH: 5 (ref 5.0–8.0)

## 2022-01-24 LAB — BASIC METABOLIC PANEL
Anion gap: 14 (ref 5–15)
Anion gap: 10 (ref 5–15)
BUN: 17 mg/dL (ref 6–20)
BUN: 12 mg/dL (ref 6–20)
CO2: 27 mmol/L (ref 22–32)
CO2: 27 mmol/L (ref 22–32)
Calcium: 7.3 mg/dL — ABNORMAL LOW (ref 8.9–10.3)
Calcium: 7.2 mg/dL — ABNORMAL LOW (ref 8.9–10.3)
Chloride: 90 mmol/L — ABNORMAL LOW (ref 98–111)
Chloride: 96 mmol/L — ABNORMAL LOW (ref 98–111)
Creatinine, Ser: 1.65 mg/dL — ABNORMAL HIGH (ref 0.61–1.24)
Creatinine, Ser: 0.87 mg/dL (ref 0.61–1.24)
GFR, Estimated: 48 mL/min — ABNORMAL LOW (ref >60)
GFR, Estimated: >60 mL/min (ref >60)
Glucose, Bld: 160 mg/dL — ABNORMAL HIGH (ref 70–99)
Glucose, Bld: 108 mg/dL — ABNORMAL HIGH (ref 70–99)
Potassium: 2.8 mmol/L — ABNORMAL LOW (ref 3.5–5.1)
Potassium: 2.9 mmol/L — ABNORMAL LOW (ref 3.5–5.1)
Sodium: 131 mmol/L — ABNORMAL LOW (ref 135–145)
Sodium: 133 mmol/L — ABNORMAL LOW (ref 135–145)

## 2022-01-24 LAB — CBC
HCT: 41.1 % (ref 39.0–52.0)
Hemoglobin: 14.5 g/dL (ref 13.0–17.0)
MCH: 35.9 pg — ABNORMAL HIGH (ref 26.0–34.0)
MCHC: 35.3 g/dL (ref 30.0–36.0)
MCV: 101.7 fL — ABNORMAL HIGH (ref 80.0–100.0)
Platelets: 231 10*3/uL (ref 150–400)
RBC: 4.04 MIL/uL — ABNORMAL LOW (ref 4.22–5.81)
RDW: 12.4 % (ref 11.5–15.5)
WBC: 12.1 10*3/uL — ABNORMAL HIGH (ref 4.0–10.5)
nRBC: 0 % (ref 0.0–0.2)

## 2022-01-24 LAB — HEPATIC FUNCTION PANEL
ALT: 22 U/L (ref 0–44)
AST: 36 U/L (ref 15–41)
Albumin: 4 g/dL (ref 3.5–5.0)
Alkaline Phosphatase: 73 U/L (ref 38–126)
Bilirubin, Direct: 0.4 mg/dL — ABNORMAL HIGH (ref 0.0–0.2)
Indirect Bilirubin: 1.2 mg/dL — ABNORMAL HIGH (ref 0.3–0.9)
Total Bilirubin: 1.6 mg/dL — ABNORMAL HIGH (ref 0.3–1.2)
Total Protein: 8.1 g/dL (ref 6.5–8.1)

## 2022-01-24 LAB — PROTIME-INR
INR: 1 (ref 0.8–1.2)
Prothrombin Time: 13.4 seconds (ref 11.4–15.2)

## 2022-01-24 LAB — AMMONIA: Ammonia: <10 umol/L (ref 9–35)

## 2022-01-24 LAB — TROPONIN I (HIGH SENSITIVITY)
Troponin I (High Sensitivity): 5 ng/L (ref <18)
Troponin I (High Sensitivity): 5 ng/L (ref <18)

## 2022-01-24 LAB — MAGNESIUM
Magnesium: 0.8 mg/dL — CL (ref 1.7–2.4)
Magnesium: 2.3 mg/dL (ref 1.7–2.4)

## 2022-01-24 LAB — APTT: aPTT: 25 seconds (ref 24–36)

## 2022-01-24 LAB — ETHANOL: Alcohol, Ethyl (B): <10 mg/dL (ref <10)

## 2022-01-24 MED ORDER — ADULT MULTIVITAMIN W/MINERALS CH
1.0000 | ORAL_TABLET | Freq: Every day | ORAL | Status: DC
  Administered 2022-01-24 – 2022-02-01 (×9): 1 via ORAL
  Filled 2022-01-24 (×9): qty 1

## 2022-01-24 MED ORDER — ONDANSETRON HCL 4 MG/2ML IJ SOLN: 4.0000 mg | Freq: Four times a day (QID) | INTRAMUSCULAR | Status: DC | PRN

## 2022-01-24 MED ORDER — FOLIC ACID 1 MG PO TABS: 1.0000 mg | ORAL_TABLET | Freq: Every day | ORAL | Status: DC

## 2022-01-24 MED ORDER — BISACODYL 5 MG PO TBEC
5.0000 mg | DELAYED_RELEASE_TABLET | Freq: Every day | ORAL | Status: DC | PRN
  Administered 2022-01-27: 5 mg via ORAL
  Filled 2022-01-24: qty 1

## 2022-01-24 MED ORDER — HYDROCODONE-ACETAMINOPHEN 5-325 MG PO TABS
1.0000 | ORAL_TABLET | ORAL | Status: DC | PRN
  Administered 2022-01-24: 1 via ORAL
  Administered 2022-01-25 (×2): 2 via ORAL
  Administered 2022-01-26: 1 via ORAL
  Administered 2022-01-26: 2 via ORAL
  Administered 2022-01-26: 1 via ORAL
  Administered 2022-01-27 (×2): 2 via ORAL
  Administered 2022-01-28: 1 via ORAL
  Administered 2022-01-28 – 2022-01-30 (×2): 2 via ORAL
  Administered 2022-01-31: 1 via ORAL
  Administered 2022-01-31 (×2): 2 via ORAL
  Administered 2022-01-31: 1 via ORAL
  Administered 2022-02-01: 2 via ORAL
  Filled 2022-01-24 (×2): qty 2
  Filled 2022-01-24 (×2): qty 1
  Filled 2022-01-24 (×4): qty 2
  Filled 2022-01-24 (×2): qty 1
  Filled 2022-01-24 (×4): qty 2
  Filled 2022-01-24 (×3): qty 1
  Filled 2022-01-24: qty 2

## 2022-01-24 MED ORDER — METOPROLOL TARTRATE 5 MG/5ML IV SOLN: 5.0000 mg | Freq: Four times a day (QID) | INTRAVENOUS | Status: DC | PRN

## 2022-01-24 MED ORDER — PANTOPRAZOLE SODIUM 40 MG PO TBEC
40.0000 mg | DELAYED_RELEASE_TABLET | Freq: Every day | ORAL | Status: DC
  Administered 2022-01-24 – 2022-02-01 (×9): 40 mg via ORAL
  Filled 2022-01-24 (×9): qty 1

## 2022-01-24 MED ORDER — POLYETHYLENE GLYCOL 3350 17 G PO PACK
17.0000 g | PACK | Freq: Every day | ORAL | Status: DC | PRN
  Administered 2022-01-27: 17 g via ORAL
  Filled 2022-01-24: qty 1

## 2022-01-24 MED ORDER — DOCUSATE SODIUM 100 MG PO CAPS
100.0000 mg | ORAL_CAPSULE | Freq: Two times a day (BID) | ORAL | Status: DC
  Administered 2022-01-24 – 2022-02-01 (×11): 100 mg via ORAL
  Filled 2022-01-24 (×14): qty 1

## 2022-01-24 MED ORDER — FOLIC ACID 1 MG PO TABS
1.0000 mg | ORAL_TABLET | Freq: Every day | ORAL | Status: DC
Start: 2022-01-24 — End: 2022-02-01
  Administered 2022-01-24 – 2022-02-01 (×9): 1 mg via ORAL
  Filled 2022-01-24 (×9): qty 1

## 2022-01-24 MED ORDER — THIAMINE HCL 100 MG/ML IJ SOLN
500.0000 mg | Freq: Once | INTRAVENOUS | Status: AC
  Administered 2022-01-24: 500 mg via INTRAVENOUS
  Filled 2022-01-24: qty 5

## 2022-01-24 MED ORDER — POTASSIUM CHLORIDE CRYS ER 20 MEQ PO TBCR
60.0000 meq | EXTENDED_RELEASE_TABLET | ORAL | Status: DC
  Administered 2022-01-24: 60 meq via ORAL
  Filled 2022-01-24: qty 3

## 2022-01-24 MED ORDER — FENTANYL CITRATE PF 50 MCG/ML IJ SOSY
50.0000 ug | PREFILLED_SYRINGE | Freq: Once | INTRAMUSCULAR | Status: AC
  Administered 2022-01-24: 50 ug via INTRAVENOUS
  Filled 2022-01-24: qty 1

## 2022-01-24 MED ORDER — ACETAMINOPHEN 650 MG RE SUPP: 650.0000 mg | Freq: Four times a day (QID) | RECTAL | Status: DC | PRN

## 2022-01-24 MED ORDER — CHLORHEXIDINE GLUCONATE CLOTH 2 % EX PADS
6.0000 | MEDICATED_PAD | Freq: Every day | CUTANEOUS | Status: DC
  Administered 2022-01-24 – 2022-01-29 (×3): 6 via TOPICAL

## 2022-01-24 MED ORDER — DIAZEPAM 5 MG PO TABS
10.0000 mg | ORAL_TABLET | Freq: Two times a day (BID) | ORAL | Status: DC
  Administered 2022-01-24 – 2022-02-01 (×16): 10 mg via ORAL
  Filled 2022-01-24 (×16): qty 2

## 2022-01-24 MED ORDER — THIAMINE HCL 100 MG PO TABS
100.0000 mg | ORAL_TABLET | Freq: Every day | ORAL | Status: DC
  Administered 2022-01-25 – 2022-02-01 (×8): 100 mg via ORAL
  Filled 2022-01-24 (×8): qty 1

## 2022-01-24 MED ORDER — ORAL CARE MOUTH RINSE
15.0000 mL | OROMUCOSAL | Status: DC | PRN
Start: 2022-01-24 — End: 2022-02-01

## 2022-01-24 MED ORDER — POTASSIUM CHLORIDE IN NACL 20-0.9 MEQ/L-% IV SOLN
INTRAVENOUS | Status: DC
  Filled 2022-01-24 (×3): qty 1000

## 2022-01-24 MED ORDER — HYDROMORPHONE HCL 1 MG/ML IJ SOLN
0.5000 mg | INTRAMUSCULAR | Status: DC | PRN
  Administered 2022-01-29: 1 mg via INTRAVENOUS
  Filled 2022-01-24: qty 1

## 2022-01-24 MED ORDER — POTASSIUM CHLORIDE CRYS ER 20 MEQ PO TBCR
20.0000 meq | EXTENDED_RELEASE_TABLET | Freq: Two times a day (BID) | ORAL | Status: DC
  Administered 2022-01-24 – 2022-02-01 (×16): 20 meq via ORAL
  Filled 2022-01-24 (×16): qty 1

## 2022-01-24 MED ORDER — ADULT MULTIVITAMIN W/MINERALS CH: 1.0000 | ORAL_TABLET | Freq: Every day | ORAL | Status: DC

## 2022-01-24 MED ORDER — IOHEXOL 300 MG/ML  SOLN
100.0000 mL | Freq: Once | INTRAMUSCULAR | Status: AC | PRN
  Administered 2022-01-24: 100 mL via INTRAVENOUS

## 2022-01-24 MED ORDER — FLEET ENEMA 7-19 GM/118ML RE ENEM: 1.0000 | ENEMA | Freq: Once | RECTAL | Status: DC | PRN

## 2022-01-24 MED ORDER — MAGNESIUM SULFATE 4 GM/100ML IV SOLN
4.0000 g | Freq: Once | INTRAVENOUS | Status: AC
  Administered 2022-01-24: 4 g via INTRAVENOUS
  Filled 2022-01-24: qty 100

## 2022-01-24 MED ORDER — LACTATED RINGERS IV BOLUS
2000.0000 mL | Freq: Once | INTRAVENOUS | Status: AC
  Administered 2022-01-24: 2000 mL via INTRAVENOUS

## 2022-01-24 MED ORDER — ACETAMINOPHEN 325 MG PO TABS
650.0000 mg | ORAL_TABLET | Freq: Four times a day (QID) | ORAL | Status: DC | PRN
  Administered 2022-01-27: 650 mg via ORAL
  Filled 2022-01-24: qty 2

## 2022-01-24 MED ORDER — LORAZEPAM 2 MG/ML IJ SOLN: 1.0000 mg | INTRAMUSCULAR | Status: DC | PRN

## 2022-01-24 MED ORDER — THIAMINE HCL 100 MG/ML IJ SOLN
500.0000 mg | Freq: Once | INTRAMUSCULAR | Status: DC
  Filled 2022-01-24: qty 6

## 2022-01-24 MED ORDER — ONDANSETRON HCL 4 MG PO TABS: 4.0000 mg | ORAL_TABLET | Freq: Four times a day (QID) | ORAL | Status: DC | PRN

## 2022-01-24 NOTE — ED Triage Notes (Signed)
Patient states he passed out at work 2 days ago, didn't go to work yesterday, went to work today but felt dizzy and left work.  Patient also reports his right arm isnt working "right"  Patient reports having difficulty remembering stuff and states that is new for him

## 2022-01-24 NOTE — ED Notes (Signed)
Patient transported to CT 

## 2022-01-24 NOTE — Progress Notes (Signed)
This CSW has added Substance Abuse Resources to the pt's AVS. TOC signing off.    Judi Saa, MSW, LCSW-A  [facility]  Transitions of Care [email or web address]

## 2022-01-24 NOTE — ED Provider Notes (Signed)
[facility] EMERGENCY DEPARTMENT Provider Note   CSN: [id number] Arrival date & time: 01/24/22  0865     History  Chief Complaint  Patient presents with   Dizziness    Eric Conley is a 61 y.o. male.  61 year old male with a history of depression, hypertension, and alcohol abuse who presented to the emergency department with multiple complaints.  Patient states that started having right upper extremity pain and weakness approximately 2 days ago.  Does not recall any events leading up to that but does report that he had an episode where he lost consciousness around that time.  Reports that coworkers found him in the break room on the ground.  Does not recall any of the events leading up to it.  Says that he had a bruise on the back of his head afterwards.  Unsure if the arm weakness started before or after then but has been having right shoulder pain as result.  Says that he has also been very dizzy since then and is worsened with standing.  Does have a history of alcohol abuse and reports that his last drink was 2 to 3 days ago.  Denies any fevers, neck pain, nausea, vomiting, diarrhea (since his last hospitalization for E. coli gastroenteritis).  No additional weakness or numbness but states his whole body including his hands bilaterally feel tingly.   Dizziness      Home Medications Prior to Admission medications   Medication Sig Start Date End Date Taking? Authorizing Provider  diazepam (VALIUM) 10 MG tablet Take 10 mg by mouth 2 (two) times daily. 12/09/21  Yes [provider]  Esomeprazole Magnesium (NEXIUM PO) Take 1 tablet by mouth daily as needed (indigestion).   Yes [provider]  omeprazole (PRILOSEC) 20 MG capsule Take 20 mg by mouth daily as needed (indigestion). Alternates with nexium   Yes [provider]  Potassium (POTASSIMIN PO) Take 1 tablet by mouth in the morning and at bedtime.   Yes [provider]      Allergies    Codeine  and Poison sumac extract    Review of Systems   Review of Systems  Neurological:  Positive for dizziness.    Physical Exam Updated Vital Signs BP 135/84 (BP Location: Right Arm)   Pulse 87   Temp 98.3 F (36.8 C) (Oral)   Resp 18   Ht  (1.753 m)   Wt 65.8 kg   SpO2 100%   BMI 21.41 kg/m  Physical Exam Vitals and nursing note reviewed.  Constitutional:      General: He is not in acute distress.    Appearance: He is well-developed.  HENT:     Head: Normocephalic.     Comments: Bruised occiput    Right Ear: External ear normal.     Left Ear: External ear normal.  Eyes:     Extraocular Movements: Extraocular movements intact.     Conjunctiva/sclera: Conjunctivae normal.     Pupils: Pupils are equal, round, and reactive to light.     Comments: Pupils 5 mm bilaterally  Neck:     Comments: No midline tenderness to palpation.  No meningismus Cardiovascular:     Rate and Rhythm: Regular rhythm. Tachycardia present.     Heart sounds: No murmur heard. Pulmonary:     Effort: Pulmonary effort is normal. No respiratory distress.     Breath sounds: Normal breath sounds.  Abdominal:     General: There is no distension.  Palpations: Abdomen is soft. There is no mass.     Tenderness: There is no abdominal tenderness. There is no guarding.  Musculoskeletal:        General: No swelling.     Cervical back: Neck supple.  Skin:    General: Skin is warm and dry.     Capillary Refill: Capillary refill takes less than 2 seconds.  Neurological:     Mental Status: He is alert.     Comments: MENTAL STATUS: AAOx2 (thought it was April) CRANIAL NERVES: II: Pupils equal and reactive 5 mm BL, no RAPD, no VF deficits III, IV, VI: EOM intact, no gaze preference or deviation, no nystagmus. V: normal sensation to light touch in V1, V2, and V3 segments bilaterally VII: no facial weakness or asymmetry, no nasolabial fold flattening VIII: normal hearing to speech and finger friction IX,  X: normal palatal elevation, no uvular deviation XI: 5/5 head turn and 2/5 shoulder shrug on right XII: midline tongue protrusion MOTOR: 5/5 strength in R elbow flexion and extension, and grip strength.  2/5 in right deltoid abduction.  5/5 strength in L shoulder flexion, elbow flexion and extension, and grip strength.  5/5 strength in R hip and knee flexion, knee extension, ankle plantar and dorsiflexion. 5/5 strength in L hip and knee flexion, knee extension, ankle plantar and dorsiflexion. SENSORY: Normal sensation to light touch in all extremities COORD: Normal finger to nose and heel to shin, no tremor, no dysmetria STATION: normal stance, truncal ataxia GAIT: Ataxic   Psychiatric:        Mood and Affect: Mood normal.     ED Results / Procedures / Treatments   Labs (all labs ordered are listed, but only abnormal results are displayed) Labs Reviewed  BASIC METABOLIC PANEL - Abnormal; Notable for the following components:      Result Value   Sodium 131 (*)    Potassium 2.8 (*)    Chloride 90 (*)    Glucose, Bld 160 (*)    Creatinine, Ser 1.65 (*)    Calcium 7.3 (*)    GFR, Estimated 48 (*)    All other components within normal limits  CBC - Abnormal; Notable for the following components:   WBC 12.1 (*)    RBC 4.04 (*)    MCV 101.7 (*)    MCH 35.9 (*)    All other components within normal limits  MAGNESIUM - Abnormal; Notable for the following components:   Magnesium 0.8 (*)    All other components within normal limits  URINALYSIS, ROUTINE W REFLEX MICROSCOPIC - Abnormal; Notable for the following components:   Specific Gravity, Urine >1.046 (*)    Hgb urine dipstick SMALL (*)    Protein, ur 30 (*)    All other components within normal limits  RAPID URINE DRUG SCREEN, HOSP PERFORMED - Abnormal; Notable for the following components:   Benzodiazepines POSITIVE (*)    All other components within normal limits  HEPATIC FUNCTION PANEL - Abnormal; Notable for the following  components:   Total Bilirubin 1.6 (*)    Bilirubin, Direct 0.4 (*)    Indirect Bilirubin 1.2 (*)    All other components within normal limits  BASIC METABOLIC PANEL - Abnormal; Notable for the following components:   Sodium 133 (*)    Potassium 2.9 (*)    Chloride 96 (*)    Glucose, Bld 108 (*)    Calcium 7.2 (*)    All other components within normal limits  ETHANOL  AMMONIA  APTT  PROTIME-INR  MAGNESIUM  CBG MONITORING, ED  CBG MONITORING, ED  TROPONIN I (HIGH SENSITIVITY)  TROPONIN I (HIGH SENSITIVITY)    EKG EKG Interpretation  Date/Time:  Monday January 24 2022 07:47:44 EDT Ventricular Rate:  135 PR Interval:  132 QRS Duration: 60 QT Interval:  330 QTC Calculation: 495 R Axis:   79 Text Interpretation: Poor data quality due to baseline Sinus tachycardia Paired ventricular premature complexes Aberrant complex Borderline repolarization abnormality Borderline prolonged QT interval Confirmed by Vonita Moss [phone]) on 01/24/2022 8:15:35 AM  Radiology CT CHEST ABDOMEN PELVIS W CONTRAST  Result Date: 01/24/2022 CLINICAL DATA:  Fall with scapular fracture. EXAM: CT CHEST, ABDOMEN, AND PELVIS WITH CONTRAST TECHNIQUE: Multidetector CT imaging of the chest, abdomen and pelvis was performed following the standard protocol during bolus administration of intravenous contrast. RADIATION DOSE REDUCTION: This exam was performed according to the departmental dose-optimization program which includes automated exposure control, adjustment of the mA and/or kV according to patient size and/or use of iterative reconstruction technique. CONTRAST:  OMNIPAQUE IOHEXOL 300 MG/ML  SOLN COMPARISON:  Chest x-ray which was acquired on the same date along with neuro axis imaging. FINDINGS: CT CHEST FINDINGS Cardiovascular: Calcified aortic atherosclerosis. Normal heart size without pericardial effusion. Normal caliber of central pulmonary vasculature. No mediastinal hematoma. Smooth contour of the  thoracic aorta. Mediastinum/Nodes: No acute process or signs of adenopathy in the mediastinum. Lungs/Pleura: No pneumothorax. Basilar atelectasis. Mild pulmonary emphysema that is worse towards the lung apices. Airways are patent. Musculoskeletal: Please see below for full musculoskeletal details. Comminuted fracture of the RIGHT inferior body of the scapula with surrounding intramuscular hematoma. RIGHT shoulder is located. RIGHT clavicle is intact. Subacute fractures of RIGHT and LEFT-sided ribs, LEFT anterior and lateral eighth rib and RIGHT lateral eighth rib. Sternum is intact. CT ABDOMEN PELVIS FINDINGS Hepatobiliary: Smooth hepatic contours. No signs of suspicious hepatic lesion or hepatic trauma. No pericholecystic stranding. No biliary duct dilation. Pancreas: Normal, without mass, inflammation or ductal dilatation. Spleen: Normal. Adrenals/Urinary Tract: Adrenal glands are normal. Smooth renal contours. No suspicious renal lesion. No hydronephrosis. Stomach/Bowel: Suspect gastric rugal thickening which is diffuse. Fold thickening may been present on previous imaging where there was better gastric distension. The stomach is under distended today which makes findings nonspecific. No acute small bowel process. Fluid-filled loops of distal small bowel with mild distension. Liquid stool in the colon proximally with better formed stool in the distal colon. No stranding adjacent to the colon. Normal appendix. Vascular/Lymphatic: Aortic atherosclerosis. No sign of aneurysm. Smooth contour of the IVC. There is no gastrohepatic or hepatoduodenal ligament lymphadenopathy. No retroperitoneal or mesenteric lymphadenopathy. No pelvic sidewall lymphadenopathy. Reproductive: Unremarkable to the extent evaluated on CT. Other: No visible body wall contusion. No signs of ascites or hemoperitoneum. Musculoskeletal: Scapular fracture on the RIGHT comminution and displacement. Rib fractures without displacement as discussed.  Sternum intact. Pelvis without signs of fracture with sacro iliac joint symmetry and normal appearance of symphysis pubis. IMPRESSION: 1. Highly comminuted fracture of the RIGHT inferior body of the scapula with surrounding intramuscular hematoma. 2. Subacute fractures of bilateral inferior and lateral ribs. 3. Question of gastric rugal thickening which is diffuse. Fold thickening may been present on previous imaging where there was better gastric distension. Correlate with any symptoms of gastritis or anemia, given persistence of this finding in hindsight over a series of prior studies would consider follow-up endoscopy for further assessment on a nonemergent basis. 4. Emphysema and aortic  atherosclerosis. Aortic Atherosclerosis (ICD10-I70.0) and Emphysema (ICD10-J43.9). Electronically Signed   By: Donzetta Kohut M.D.   On: 01/24/2022 11:34   DG Humerus Right  Result Date: 01/24/2022 CLINICAL DATA:  Right humeral abnormality on radiograph EXAM: RIGHT HUMERUS - 2+ VIEW COMPARISON:  None Available. FINDINGS: There is a displaced osseous fragment about the lateral aspect of the scapula, most consistent with a scapular fracture. No appreciable fracture of the right humerus. IMPRESSION: 1. Displaced osseous fragment about the lateral aspect of the scapula with irregularity of the lateral border of the scapula which is partially imaged, most consistent with a scapular fracture. 2. No appreciable fracture of the right humerus. Electronically Signed   By: Larose Hires D.O.   On: 01/24/2022 10:07   CT Head Wo Contrast  Result Date: 01/24/2022 CLINICAL DATA:  Dizziness, RUE weakness; Neck trauma, focal neuro deficit or paresthesia (Age 31-64y). Syncopal episode 2 days ago. EXAM: CT HEAD WITHOUT CONTRAST CT CERVICAL SPINE WITHOUT CONTRAST TECHNIQUE: Multidetector CT imaging of the head and cervical spine was performed following the standard protocol without intravenous contrast. Multiplanar CT image reconstructions of  the cervical spine were also generated. RADIATION DOSE REDUCTION: This exam was performed according to the departmental dose-optimization program which includes automated exposure control, adjustment of the mA and/or kV according to patient size and/or use of iterative reconstruction technique. COMPARISON:  CT head and cervical spine 07/26/2021 FINDINGS: CT HEAD FINDINGS Brain: A biconvex, hyperdense extra-axial hematoma over the left parietal convexity measures 1.9 cm in thickness and may be subdural or epidural. This results in mild regional mass effect without midline shift. A thin subdural hematoma extends more anteriorly along the left lateral cerebral convexity and measures up to 3 mm in thickness. Small regions of edema anteriorly in the left frontal and left temporal lobes are most compatible with contusions, and there is a small amount of associated subarachnoid and possibly parenchymal hemorrhage in these regions. The ventricles are normal in size. Vascular: Calcified atherosclerosis at the skull base. No hyperdense vessel. Skull: No acute fracture or suspicious osseous lesion. Sinuses/Orbits: Mild mucosal thickening in the included paranasal sinuses. Clear mastoid air cells. Unremarkable orbits. Other: Mild posterior scalp swelling. CT CERVICAL SPINE FINDINGS Alignment: Mild right convex curvature of the cervical spine. No traumatic subluxation. Skull base and vertebrae: No acute fracture or suspicious osseous lesion. Soft tissues and spinal canal: No prevertebral fluid or swelling. No visible canal hematoma. Disc levels: Moderate disc degeneration in the mid to lower cervical spine and mild facet arthrosis. Mild-to-moderate right neural foraminal stenosis at C6-7 due to uncovertebral spurring. Upper chest: Mild centrilobular and paraseptal emphysema. Other: None. IMPRESSION: 1. Extra-axial hematoma over the left cerebral convexity as above. Mild mass effect without midline shift. 2. Left frontal and  temporal lobe contusions with small amount of associated subarachnoid and possibly parenchymal hemorrhage. 3. No skull fracture or cervical spine fracture. 4. Emphysema (ICD10-J43.9). Critical Value/emergent results were called by telephone at the time of interpretation on 01/24/2022 at 9:32 am to Dr. Vonita Moss, who verbally acknowledged these results. Electronically Signed   By: Sebastian Ache M.D.   On: 01/24/2022 09:35   CT Cervical Spine Wo Contrast  Result Date: 01/24/2022 CLINICAL DATA:  Dizziness, RUE weakness; Neck trauma, focal neuro deficit or paresthesia (Age 27-64y). Syncopal episode 2 days ago. EXAM: CT HEAD WITHOUT CONTRAST CT CERVICAL SPINE WITHOUT CONTRAST TECHNIQUE: Multidetector CT imaging of the head and cervical spine was performed following the standard protocol without intravenous contrast. Multiplanar  CT image reconstructions of the cervical spine were also generated. RADIATION DOSE REDUCTION: This exam was performed according to the departmental dose-optimization program which includes automated exposure control, adjustment of the mA and/or kV according to patient size and/or use of iterative reconstruction technique. COMPARISON:  CT head and cervical spine 07/26/2021 FINDINGS: CT HEAD FINDINGS Brain: A biconvex, hyperdense extra-axial hematoma over the left parietal convexity measures 1.9 cm in thickness and may be subdural or epidural. This results in mild regional mass effect without midline shift. A thin subdural hematoma extends more anteriorly along the left lateral cerebral convexity and measures up to 3 mm in thickness. Small regions of edema anteriorly in the left frontal and left temporal lobes are most compatible with contusions, and there is a small amount of associated subarachnoid and possibly parenchymal hemorrhage in these regions. The ventricles are normal in size. Vascular: Calcified atherosclerosis at the skull base. No hyperdense vessel. Skull: No acute fracture or  suspicious osseous lesion. Sinuses/Orbits: Mild mucosal thickening in the included paranasal sinuses. Clear mastoid air cells. Unremarkable orbits. Other: Mild posterior scalp swelling. CT CERVICAL SPINE FINDINGS Alignment: Mild right convex curvature of the cervical spine. No traumatic subluxation. Skull base and vertebrae: No acute fracture or suspicious osseous lesion. Soft tissues and spinal canal: No prevertebral fluid or swelling. No visible canal hematoma. Disc levels: Moderate disc degeneration in the mid to lower cervical spine and mild facet arthrosis. Mild-to-moderate right neural foraminal stenosis at C6-7 due to uncovertebral spurring. Upper chest: Mild centrilobular and paraseptal emphysema. Other: None. IMPRESSION: 1. Extra-axial hematoma over the left cerebral convexity as above. Mild mass effect without midline shift. 2. Left frontal and temporal lobe contusions with small amount of associated subarachnoid and possibly parenchymal hemorrhage. 3. No skull fracture or cervical spine fracture. 4. Emphysema (ICD10-J43.9). Critical Value/emergent results were called by telephone at the time of interpretation on 01/24/2022 at 9:32 am to Dr. Vonita Moss, who verbally acknowledged these results. Electronically Signed   By: Sebastian Ache M.D.   On: 01/24/2022 09:35   DG Shoulder Right  Result Date: 01/24/2022 CLINICAL DATA:  Larey Seat 2 days ago with shoulder pain EXAM: RIGHT SHOULDER - 2+ VIEW COMPARISON:  None FINDINGS: On the frontal projection, the glenohumeral joint appears normal. The AC joint appears normal. No fracture is seen. On the transscapular Y-view projection, there is suggestion of a segmental cortical fracture of the proximal humeral diaphysis. This would be quite unusual and there is no sign of this on the frontal projection. It could relate to overlying artifact. Particularly if the patient has significant proximal humeral pain, I would suggest repeating the examination versus consider  CT. IMPRESSION: See above discussion regarding the possibility of an unusual segmental cortical fracture of the proximal humerus. Electronically Signed   By: Paulina Fusi M.D.   On: 01/24/2022 09:28   DG Chest 2 View  Result Date: 01/24/2022 CLINICAL DATA:  dizziness, confusion, right arm pain, syncope EXAM: CHEST - 2 VIEW COMPARISON:  Chest x-ray 12/30/2021. FINDINGS: The heart size and mediastinal contours are within normal limits. Both lungs are clear. No visible pleural effusions or pneumothorax. No acute osseous abnormality. IMPRESSION: No evidence of acute cardiopulmonary disease. Electronically Signed   By: Feliberto Harts M.D.   On: 01/24/2022 09:22    Procedures Procedures    Medications Ordered in ED Medications  folic acid (FOLVITE) tablet 1 mg (1 mg Oral Given 01/24/22 0924)  multivitamin with minerals tablet 1 tablet (1 tablet Oral Given 01/24/22  5176)  potassium chloride SA (KLOR-CON M) CR tablet 60 mEq (60 mEq Oral Given 01/24/22 0924)  lactated ringers bolus 2,000 mL (0 mLs Intravenous Stopped 01/24/22 1153)  magnesium sulfate IVPB 4 g 100 mL (0 g Intravenous Stopped 01/24/22 1131)  thiamine (VITAMIN B1) 500 mg in normal saline (50 mL) IVPB (0 mg Intravenous Stopped 01/24/22 1155)  iohexol (OMNIPAQUE) 300 MG/ML solution 100 mL (100 mLs Intravenous Contrast Given 01/24/22 1058)  fentaNYL (SUBLIMAZE) injection 50 mcg (50 mcg Intravenous Given 01/24/22 1943)    ED Course/ Medical Decision Making/ A&P Clinical Course as of 01/24/22 2011  Mon Jan 24, 2022  0929 CT head reviewed and interpreted by me as showing left parietal occipital epidural hematoma.  Neurosurgery consulted.  Chest x-ray reviewed and interpreted by me as showing no acute abnormality. [RP]  [phone] Called by radiology who states that in addition to the posterior epidural hematoma it appears that there may be some components of subdural hematoma also with frontal contusions and small amount of subarachnoid hemorrhage.  [RP]  [phone] Spoke with Dr. Jordan Likes from NSGY who will direct admit the patient to the neuro ICU.  [RP]  1016 XR chest shows scapular fracture.  Given the fact that this is atypical around the fall and patient is currently altered and may not be able to provide sufficient history will obtain [RP]  1139 CT CHEST ABDOMEN PELVIS W CONTRAST IMPRESSION: 1. Highly comminuted fracture of the RIGHT inferior body of the scapula with surrounding intramuscular hematoma. 2. Subacute fractures of bilateral inferior and lateral ribs. 3. Question of gastric rugal thickening which is diffuse. Fold thickening may been present on previous imaging where there was better gastric distension. Correlate with any symptoms of gastritis or anemia, given persistence of this finding in hindsight over a series of prior studies would consider follow-up endoscopy for further assessment on a nonemergent basis. 4. Emphysema and aortic atherosclerosis. [RP]  1142 Consulting orthopedics. [RP]  1147 Ortho on call feels that pt should be evaluated by [place] team since he will be getting his care there.  [RP]  1257 Reassessed. Pt's exam unchanged. Alert and conversant. No UE or LE weakness or numbness.  [RP]    Clinical Course User Index [RP] Rondel Baton, MD                           Medical Decision Making Amount and/or Complexity of Data Reviewed Labs: ordered. Radiology: ordered. Decision-making details documented in ED Course.  Risk Prescription drug management. Decision regarding hospitalization.   61 year old male with a history of depression, hypertension, and alcohol abuse who presented to the emergency department with multiple complaints including: shoulder pain, RUE weakness, and dizziness  Initial DDx: wernike korsakoff syndrome, hyponatremia, dehydration intoxication, etoh wd, ICH, c-spine fracture, R shoulder fracture vs soft tissue contusion   Plan:  Labs Etoh level Troponin Ammonia  UDS CT head  and c-spine C-collar EKG XR shoulder  ED Summary:  Patient underwent the above work-up which revealed a CT scan that showed an epidural hematoma on my read.  Neurosurgery was consulted and recommended transfer to [facility].  Also.  To be very dehydrated with an AKI and severe electrolyte abnormalities likely in the setting of his EtOH use.  Patient was given several liters of fluid and had his potassium and magnesium repleted.  Ammonia was also sent which was not significantly elevated.  C-spine did not show any injuries but given  his right upper extremity weakness will remain in place until neurosurgery evaluates patient.  His x-ray of the shoulder did show bone fragment and on repeat x-rays revealed that he had a scapular fracture.  Since this did not fit with the mechanism of injury trauma scans were obtained of the chest, abdomen, pelvis which revealed that he also had several subacute rib fractures.  Discussed with Ortho at [facility] who recommended that Ortho be engaged when he gets to [facility].  Blood pressure remained within normal limits and did not require clevidipine.  Patient was also placed on CIWA protocol but did not require benzodiazepines while in the emergency department. He was signed out to Dr Durwin Nora awaiting transfer to [facility] neuro ICU.   Consults: - NSGY   Records reviewed Care Everywhere/External Records  The following labs were independently interpreted: BMP with AKI  I independently visualized the following imaging with scope of interpretation limited to determining acute life threatening conditions related to emergency care: CT head, which revealed posterior epidural hematoma  CRITICAL CARE Performed by: Rondel Baton   Total critical care time: 45 minutes  Critical care time was exclusive of separately billable procedures and treating other patients.  Critical care was necessary to treat or prevent imminent or life-threatening  deterioration.  Critical care was time spent personally by me on the following activities: development of treatment plan with patient and/or surrogate as well as nursing, discussions with consultants, evaluation of patient's response to treatment, examination of patient, obtaining history from patient or surrogate, ordering and performing treatments and interventions, ordering and review of laboratory studies, ordering and review of radiographic studies, pulse oximetry and re-evaluation of patient's condition.   Final Clinical Impression(s) / ED Diagnoses Final diagnoses:  Subdural hematoma (HCC)  Closed displaced fracture of body of right scapula, initial encounter  Hypokalemia  Hypomagnesemia  ETOH abuse    Rx / DC Orders ED Discharge Orders     None         Rondel Baton, MD 01/24/22 2013

## 2022-01-24 NOTE — H&P (Signed)
Eric Conley is an 61 y.o. male.   Chief Complaint: Traumatic brain injury HPI: 61 year old male status post fall at work.  Brief loss of consciousness.  No history of hypotension or hypoxia.  No history of seizure.  Patient awakened and was mildly confused at the scene.  Transferred to [facility] emergency room.  Patient complains of headache.  Some neck pain.  And also right shoulder pain.  He has some mild tingling in both hands transiently but this has resolved.  He has a history of daily drinking but states he has never been told he has a drinking problem.  Past Medical History:  Diagnosis Date   Acid reflux    Depression    Hypertension     History reviewed. No pertinent surgical history.  History reviewed. No pertinent family history. Social History:  reports that he has been smoking cigarettes. He has been smoking an average of .25 packs per day. He has never used smokeless tobacco. He reports current alcohol use of about 7.0 standard drinks of alcohol per week. He reports that he does not currently use drugs.  Allergies:  Allergies  Allergen Reactions   Codeine    Poison Sumac Extract     Medications Prior to Admission  Medication Sig Dispense Refill   diazepam (VALIUM) 10 MG tablet Take 10 mg by mouth 2 (two) times daily.     Esomeprazole Magnesium (NEXIUM PO) Take 1 tablet by mouth daily as needed (indigestion).     omeprazole (PRILOSEC) 20 MG capsule Take 20 mg by mouth daily as needed (indigestion). Alternates with nexium     Potassium (POTASSIMIN PO) Take 1 tablet by mouth in the morning and at bedtime.      Results for orders placed or performed during the hospital encounter of 01/24/22 (from the past 48 hour(s))  Magnesium     Status: Abnormal   Collection Time: 01/24/22  7:00 AM  Result Value Ref Range   Magnesium 0.8 (LL) 1.7 - 2.4 mg/dL    Comment: CRITICAL RESULT CALLED TO, READ BACK BY AND VERIFIED WITH: MCIVER,M AT 8:50AM ON 01/24/22 BY  [facility] IFESTERMAN,C Performed at [facility], [address] [place], [place] [address]   Basic metabolic panel     Status: Abnormal   Collection Time: 01/24/22  8:00 AM  Result Value Ref Range   Sodium 131 (L) 135 - 145 mmol/L   Potassium 2.8 (L) 3.5 - 5.1 mmol/L   Chloride 90 (L) 98 - 111 mmol/L   CO2 27 22 - 32 mmol/L   Glucose, Bld 160 (H) 70 - 99 mg/dL    Comment: Glucose reference range applies only to samples taken after fasting for at least 8 hours.   BUN 17 6 - 20 mg/dL   Creatinine, Ser 9.141.65 (H) 0.61 - 1.24 mg/dL   Calcium 7.3 (L) 8.9 - 10.3 mg/dL   GFR, Estimated 48 (L) >60 mL/min    Comment: (NOTE) Calculated using the CKD-EPI Creatinine Equation (2021)    Anion gap 14 5 - 15    Comment: Performed at [facility], [address] [place], [place] [address]  CBC     Status: Abnormal   Collection Time: 01/24/22  8:00 AM  Result Value Ref Range   WBC 12.1 (H) 4.0 - 10.5 K/uL   RBC 4.04 (L) 4.22 - 5.81 MIL/uL   Hemoglobin 14.5 13.0 - 17.0 g/dL   HCT 62.141.1 30.839.0 - 65.752.0 %   MCV 101.7 (H) 80.0 - 100.0  fL   MCH 35.9 (H) 26.0 - 34.0 pg   MCHC 35.3 30.0 - 36.0 g/dL   RDW 09.9 83.3 - 82.5 %   Platelets 231 150 - 400 K/uL   nRBC 0.0 0.0 - 0.2 %    Comment: Performed at [facility], [address] [place], [place] [address]  Troponin I (High Sensitivity)     Status: None   Collection Time: 01/24/22  8:00 AM  Result Value Ref Range   Troponin I (High Sensitivity) 5 <18 ng/L    Comment: (NOTE) Elevated high sensitivity troponin I (hsTnI) values and significant  changes across serial measurements may suggest ACS but many other  chronic and acute conditions are known to elevate hsTnI results.  Refer to the "Links" section for chest pain algorithms and additional  guidance. Performed at [facility], [address], [place], [place] [address]   Ethanol     Status: None   Collection Time: 01/24/22  9:18 AM  Result Value Ref Range   Alcohol, Ethyl (B) <10 <10 mg/dL    Comment:  (NOTE) Lowest detectable limit for serum alcohol is 10 mg/dL.  For medical purposes only. Performed at [facility], [address] [place], [place] [address]   Ammonia     Status: None   Collection Time: 01/24/22  9:18 AM  Result Value Ref Range   Ammonia <10 9 - 35 umol/L    Comment: Performed at [facility], [address] [place], [place] [address]  Hepatic function panel     Status: Abnormal   Collection Time: 01/24/22  9:18 AM  Result Value Ref Range   Total Protein 8.1 6.5 - 8.1 g/dL   Albumin 4.0 3.5 - 5.0 g/dL   AST 36 15 - 41 U/L   ALT 22 0 - 44 U/L   Alkaline Phosphatase 73 38 - 126 U/L   Total Bilirubin 1.6 (H) 0.3 - 1.2 mg/dL   Bilirubin, Direct 0.4 (H) 0.0 - 0.2 mg/dL   Indirect Bilirubin 1.2 (H) 0.3 - 0.9 mg/dL    Comment: Performed at [facility], [address], [place], [place] [address]  Troponin I (High Sensitivity)     Status: None   Collection Time: 01/24/22  9:47 AM  Result Value Ref Range   Troponin I (High Sensitivity) 5 <18 ng/L    Comment: (NOTE) Elevated high sensitivity troponin I (hsTnI) values and significant  changes across serial measurements may suggest ACS but many other  chronic and acute conditions are known to elevate hsTnI results.  Refer to the "Links" section for chest pain algorithms and additional  guidance. Performed at [facility], [address], [place], [place] [address]   APTT     Status: None   Collection Time: 01/24/22  9:56 AM  Result Value Ref Range   aPTT 25 24 - 36 seconds    Comment: Performed at [facility], [address] [place], [place] [address]  Protime-INR     Status: None   Collection Time: 01/24/22  9:56 AM  Result Value Ref Range   Prothrombin Time 13.4 11.4 - 15.2 seconds   INR 1.0 0.8 - 1.2    Comment: (NOTE) INR goal varies based on device and disease states. Performed at [facility], [address], [place], [place] [address]   Urinalysis, Routine w reflex microscopic Urine, Clean Catch      Status: Abnormal   Collection Time: 01/24/22  4:21 PM  Result Value Ref Range  Color, Urine YELLOW YELLOW   APPearance CLEAR CLEAR   Specific Gravity, Urine >1.046 (H) 1.005 - 1.030   pH 5.0 5.0 - 8.0   Glucose, UA NEGATIVE NEGATIVE mg/dL   Hgb urine dipstick SMALL (A) NEGATIVE   Bilirubin Urine NEGATIVE NEGATIVE   Ketones, ur NEGATIVE NEGATIVE mg/dL   Protein, ur 30 (A) NEGATIVE mg/dL   Nitrite NEGATIVE NEGATIVE   Leukocytes,Ua NEGATIVE NEGATIVE   WBC, UA 0-5 0 - 5 WBC/hpf   Bacteria, UA NONE SEEN NONE SEEN   Mucus PRESENT    Hyaline Casts, UA PRESENT     Comment: Performed at [facility], [address], [place], [place] [address]  Rapid urine drug screen (hospital performed)     Status: Abnormal   Collection Time: 01/24/22  4:21 PM  Result Value Ref Range   Opiates NONE DETECTED NONE DETECTED   Cocaine NONE DETECTED NONE DETECTED   Benzodiazepines POSITIVE (A) NONE DETECTED   Amphetamines NONE DETECTED NONE DETECTED   Tetrahydrocannabinol NONE DETECTED NONE DETECTED   Barbiturates NONE DETECTED NONE DETECTED    Comment: (NOTE) DRUG SCREEN FOR MEDICAL PURPOSES ONLY.  IF CONFIRMATION IS NEEDED FOR ANY PURPOSE, NOTIFY LAB WITHIN 5 DAYS.  LOWEST DETECTABLE LIMITS FOR URINE DRUG SCREEN Drug Class                     Cutoff (ng/mL) Amphetamine and metabolites    1000 Barbiturate and metabolites    200 Benzodiazepine                 200 Tricyclics and metabolites     300 Opiates and metabolites        300 Cocaine and metabolites        300 THC                            50 Performed at [facility], [address], [place], [place] [address]   Basic metabolic panel     Status: Abnormal   Collection Time: 01/24/22  5:00 PM  Result Value Ref Range   Sodium 133 (L) 135 - 145 mmol/L   Potassium 2.9 (L) 3.5 - 5.1 mmol/L   Chloride 96 (L) 98 - 111 mmol/L   CO2 27 22 - 32 mmol/L   Glucose, Bld 108 (H) 70 - 99 mg/dL    Comment: Glucose reference range applies only to samples  taken after fasting for at least 8 hours.   BUN 12 6 - 20 mg/dL   Creatinine, Ser 0.93 0.61 - 1.24 mg/dL   Calcium 7.2 (L) 8.9 - 10.3 mg/dL   GFR, Estimated >26 >71 mL/min    Comment: (NOTE) Calculated using the CKD-EPI Creatinine Equation (2021)    Anion gap 10 5 - 15    Comment: Performed at [facility], [address] [place], [place] [address]  Magnesium     Status: None   Collection Time: 01/24/22  5:00 PM  Result Value Ref Range   Magnesium 2.3 1.7 - 2.4 mg/dL    Comment: Performed at [facility], [address], [place], [place] [address]   CT CHEST ABDOMEN PELVIS W CONTRAST  Result Date: 01/24/2022 CLINICAL DATA:  Fall with scapular fracture. EXAM: CT CHEST, ABDOMEN, AND PELVIS WITH CONTRAST TECHNIQUE: Multidetector CT imaging of the chest, abdomen and pelvis was performed following the standard protocol during bolus administration of intravenous contrast. RADIATION DOSE REDUCTION: This exam was performed  according to the departmental dose-optimization program which includes automated exposure control, adjustment of the mA and/or kV according to patient size and/or use of iterative reconstruction technique. CONTRAST:  OMNIPAQUE IOHEXOL 300 MG/ML  SOLN COMPARISON:  Chest x-ray which was acquired on the same date along with neuro axis imaging. FINDINGS: CT CHEST FINDINGS Cardiovascular: Calcified aortic atherosclerosis. Normal heart size without pericardial effusion. Normal caliber of central pulmonary vasculature. No mediastinal hematoma. Smooth contour of the thoracic aorta. Mediastinum/Nodes: No acute process or signs of adenopathy in the mediastinum. Lungs/Pleura: No pneumothorax. Basilar atelectasis. Mild pulmonary emphysema that is worse towards the lung apices. Airways are patent. Musculoskeletal: Please see below for full musculoskeletal details. Comminuted fracture of the RIGHT inferior body of the scapula with surrounding intramuscular hematoma. RIGHT shoulder is located.  RIGHT clavicle is intact. Subacute fractures of RIGHT and LEFT-sided ribs, LEFT anterior and lateral eighth rib and RIGHT lateral eighth rib. Sternum is intact. CT ABDOMEN PELVIS FINDINGS Hepatobiliary: Smooth hepatic contours. No signs of suspicious hepatic lesion or hepatic trauma. No pericholecystic stranding. No biliary duct dilation. Pancreas: Normal, without mass, inflammation or ductal dilatation. Spleen: Normal. Adrenals/Urinary Tract: Adrenal glands are normal. Smooth renal contours. No suspicious renal lesion. No hydronephrosis. Stomach/Bowel: Suspect gastric rugal thickening which is diffuse. Fold thickening may been present on previous imaging where there was better gastric distension. The stomach is under distended today which makes findings nonspecific. No acute small bowel process. Fluid-filled loops of distal small bowel with mild distension. Liquid stool in the colon proximally with better formed stool in the distal colon. No stranding adjacent to the colon. Normal appendix. Vascular/Lymphatic: Aortic atherosclerosis. No sign of aneurysm. Smooth contour of the IVC. There is no gastrohepatic or hepatoduodenal ligament lymphadenopathy. No retroperitoneal or mesenteric lymphadenopathy. No pelvic sidewall lymphadenopathy. Reproductive: Unremarkable to the extent evaluated on CT. Other: No visible body wall contusion. No signs of ascites or hemoperitoneum. Musculoskeletal: Scapular fracture on the RIGHT comminution and displacement. Rib fractures without displacement as discussed. Sternum intact. Pelvis without signs of fracture with sacro iliac joint symmetry and normal appearance of symphysis pubis. IMPRESSION: 1. Highly comminuted fracture of the RIGHT inferior body of the scapula with surrounding intramuscular hematoma. 2. Subacute fractures of bilateral inferior and lateral ribs. 3. Question of gastric rugal thickening which is diffuse. Fold thickening may been present on previous imaging where  there was better gastric distension. Correlate with any symptoms of gastritis or anemia, given persistence of this finding in hindsight over a series of prior studies would consider follow-up endoscopy for further assessment on a nonemergent basis. 4. Emphysema and aortic atherosclerosis. Aortic Atherosclerosis (ICD10-I70.0) and Emphysema (ICD10-J43.9). Electronically Signed   By: Donzetta Kohut M.D.   On: 01/24/2022 11:34   DG Humerus Right  Result Date: 01/24/2022 CLINICAL DATA:  Right humeral abnormality on radiograph EXAM: RIGHT HUMERUS - 2+ VIEW COMPARISON:  None Available. FINDINGS: There is a displaced osseous fragment about the lateral aspect of the scapula, most consistent with a scapular fracture. No appreciable fracture of the right humerus. IMPRESSION: 1. Displaced osseous fragment about the lateral aspect of the scapula with irregularity of the lateral border of the scapula which is partially imaged, most consistent with a scapular fracture. 2. No appreciable fracture of the right humerus. Electronically Signed   By: Larose Hires D.O.   On: 01/24/2022 10:07   CT Head Wo Contrast  Result Date: 01/24/2022 CLINICAL DATA:  Dizziness, RUE weakness; Neck trauma, focal neuro deficit or paresthesia (Age 43-64y).  Syncopal episode 2 days ago. EXAM: CT HEAD WITHOUT CONTRAST CT CERVICAL SPINE WITHOUT CONTRAST TECHNIQUE: Multidetector CT imaging of the head and cervical spine was performed following the standard protocol without intravenous contrast. Multiplanar CT image reconstructions of the cervical spine were also generated. RADIATION DOSE REDUCTION: This exam was performed according to the departmental dose-optimization program which includes automated exposure control, adjustment of the mA and/or kV according to patient size and/or use of iterative reconstruction technique. COMPARISON:  CT head and cervical spine 07/26/2021 FINDINGS: CT HEAD FINDINGS Brain: A biconvex, hyperdense extra-axial hematoma  over the left parietal convexity measures 1.9 cm in thickness and may be subdural or epidural. This results in mild regional mass effect without midline shift. A thin subdural hematoma extends more anteriorly along the left lateral cerebral convexity and measures up to 3 mm in thickness. Small regions of edema anteriorly in the left frontal and left temporal lobes are most compatible with contusions, and there is a small amount of associated subarachnoid and possibly parenchymal hemorrhage in these regions. The ventricles are normal in size. Vascular: Calcified atherosclerosis at the skull base. No hyperdense vessel. Skull: No acute fracture or suspicious osseous lesion. Sinuses/Orbits: Mild mucosal thickening in the included paranasal sinuses. Clear mastoid air cells. Unremarkable orbits. Other: Mild posterior scalp swelling. CT CERVICAL SPINE FINDINGS Alignment: Mild right convex curvature of the cervical spine. No traumatic subluxation. Skull base and vertebrae: No acute fracture or suspicious osseous lesion. Soft tissues and spinal canal: No prevertebral fluid or swelling. No visible canal hematoma. Disc levels: Moderate disc degeneration in the mid to lower cervical spine and mild facet arthrosis. Mild-to-moderate right neural foraminal stenosis at C6-7 due to uncovertebral spurring. Upper chest: Mild centrilobular and paraseptal emphysema. Other: None. IMPRESSION: 1. Extra-axial hematoma over the left cerebral convexity as above. Mild mass effect without midline shift. 2. Left frontal and temporal lobe contusions with small amount of associated subarachnoid and possibly parenchymal hemorrhage. 3. No skull fracture or cervical spine fracture. 4. Emphysema (ICD10-J43.9). Critical Value/emergent results were called by telephone at the time of interpretation on 01/24/2022 at 9:32 am to Dr. Vonita Moss, who verbally acknowledged these results. Electronically Signed   By: Sebastian Ache M.D.   On: 01/24/2022 09:35    CT Cervical Spine Wo Contrast  Result Date: 01/24/2022 CLINICAL DATA:  Dizziness, RUE weakness; Neck trauma, focal neuro deficit or paresthesia (Age 48-64y). Syncopal episode 2 days ago. EXAM: CT HEAD WITHOUT CONTRAST CT CERVICAL SPINE WITHOUT CONTRAST TECHNIQUE: Multidetector CT imaging of the head and cervical spine was performed following the standard protocol without intravenous contrast. Multiplanar CT image reconstructions of the cervical spine were also generated. RADIATION DOSE REDUCTION: This exam was performed according to the departmental dose-optimization program which includes automated exposure control, adjustment of the mA and/or kV according to patient size and/or use of iterative reconstruction technique. COMPARISON:  CT head and cervical spine 07/26/2021 FINDINGS: CT HEAD FINDINGS Brain: A biconvex, hyperdense extra-axial hematoma over the left parietal convexity measures 1.9 cm in thickness and may be subdural or epidural. This results in mild regional mass effect without midline shift. A thin subdural hematoma extends more anteriorly along the left lateral cerebral convexity and measures up to 3 mm in thickness. Small regions of edema anteriorly in the left frontal and left temporal lobes are most compatible with contusions, and there is a small amount of associated subarachnoid and possibly parenchymal hemorrhage in these regions. The ventricles are normal in size. Vascular: Calcified atherosclerosis at  the skull base. No hyperdense vessel. Skull: No acute fracture or suspicious osseous lesion. Sinuses/Orbits: Mild mucosal thickening in the included paranasal sinuses. Clear mastoid air cells. Unremarkable orbits. Other: Mild posterior scalp swelling. CT CERVICAL SPINE FINDINGS Alignment: Mild right convex curvature of the cervical spine. No traumatic subluxation. Skull base and vertebrae: No acute fracture or suspicious osseous lesion. Soft tissues and spinal canal: No prevertebral fluid  or swelling. No visible canal hematoma. Disc levels: Moderate disc degeneration in the mid to lower cervical spine and mild facet arthrosis. Mild-to-moderate right neural foraminal stenosis at C6-7 due to uncovertebral spurring. Upper chest: Mild centrilobular and paraseptal emphysema. Other: None. IMPRESSION: 1. Extra-axial hematoma over the left cerebral convexity as above. Mild mass effect without midline shift. 2. Left frontal and temporal lobe contusions with small amount of associated subarachnoid and possibly parenchymal hemorrhage. 3. No skull fracture or cervical spine fracture. 4. Emphysema (ICD10-J43.9). Critical Value/emergent results were called by telephone at the time of interpretation on 01/24/2022 at 9:32 am to Dr. Vonita Moss, who verbally acknowledged these results. Electronically Signed   By: Sebastian Ache M.D.   On: 01/24/2022 09:35   DG Shoulder Right  Result Date: 01/24/2022 CLINICAL DATA:  Larey Seat 2 days ago with shoulder pain EXAM: RIGHT SHOULDER - 2+ VIEW COMPARISON:  None FINDINGS: On the frontal projection, the glenohumeral joint appears normal. The AC joint appears normal. No fracture is seen. On the transscapular Y-view projection, there is suggestion of a segmental cortical fracture of the proximal humeral diaphysis. This would be quite unusual and there is no sign of this on the frontal projection. It could relate to overlying artifact. Particularly if the patient has significant proximal humeral pain, I would suggest repeating the examination versus consider CT. IMPRESSION: See above discussion regarding the possibility of an unusual segmental cortical fracture of the proximal humerus. Electronically Signed   By: Paulina Fusi M.D.   On: 01/24/2022 09:28   DG Chest 2 View  Result Date: 01/24/2022 CLINICAL DATA:  dizziness, confusion, right arm pain, syncope EXAM: CHEST - 2 VIEW COMPARISON:  Chest x-ray 12/30/2021. FINDINGS: The heart size and mediastinal contours are within  normal limits. Both lungs are clear. No visible pleural effusions or pneumothorax. No acute osseous abnormality. IMPRESSION: No evidence of acute cardiopulmonary disease. Electronically Signed   By: Feliberto Harts M.D.   On: 01/24/2022 09:22    Pertinent items noted in HPI and remainder of comprehensive ROS otherwise negative.  Blood pressure (!) 116/92, pulse 87, temperature 98 F (36.7 C), temperature source Oral, resp. rate 16, height 5\' 9"  (1.753 m), weight 66.7 kg, SpO2 99 %.  Patient is awake and alert.  He is oriented and appropriate.  His speech is fluent.  His judgment and insight appear intact.  Cranial nerve function normal bilateral.  Motor examination reveals intact motor strength both upper and lower extremities except his right shoulder is limited in movement.  Chest and abdomen are mildly tender but overall benign appearing.  Extremities are notable for the shoulder tenderness there is noes gross bony abnormality of the shoulder joint itself.  He does have tenderness along his scapula on the right side. Assessment/Plan Patient is status post significant traumatic brain injury with left frontal contusion with some overlying subarachnoid hemorrhage.  Patient also with a loculated left posterior parietal subdural hematoma with mild mass effect.  There is no evidence of fracture.  Patient also with a comminuted left scapular fracture.  We will continue in sling  for now.  Discussed with orthopedics tomorrow.  Plan follow-up head CT scan tomorrow.  Overall patient seems to have reasonably low risk for rehemorrhage.  Eric Conley 01/24/2022, 9:43 PM

## 2022-01-24 NOTE — ED Notes (Signed)
Head of bed at 45 degrees.  

## 2022-01-24 NOTE — ED Notes (Signed)
C collar placed on patient per MD verbal order

## 2022-01-25 ENCOUNTER — Inpatient Hospital Stay (HOSPITAL_COMMUNITY): Payer: 59

## 2022-01-25 LAB — BASIC METABOLIC PANEL
Anion gap: 12 (ref 5–15)
BUN: 9 mg/dL (ref 6–20)
CO2: 24 mmol/L (ref 22–32)
Calcium: 6.9 mg/dL — ABNORMAL LOW (ref 8.9–10.3)
Chloride: 97 mmol/L — ABNORMAL LOW (ref 98–111)
Creatinine, Ser: 0.81 mg/dL (ref 0.61–1.24)
GFR, Estimated: >60 mL/min (ref >60)
Glucose, Bld: 110 mg/dL — ABNORMAL HIGH (ref 70–99)
Potassium: 3.2 mmol/L — ABNORMAL LOW (ref 3.5–5.1)
Sodium: 133 mmol/L — ABNORMAL LOW (ref 135–145)

## 2022-01-25 LAB — CBC
HCT: 32.8 % — ABNORMAL LOW (ref 39.0–52.0)
Hemoglobin: 11.6 g/dL — ABNORMAL LOW (ref 13.0–17.0)
MCH: 36.1 pg — ABNORMAL HIGH (ref 26.0–34.0)
MCHC: 35.4 g/dL (ref 30.0–36.0)
MCV: 102.2 fL — ABNORMAL HIGH (ref 80.0–100.0)
Platelets: 183 10*3/uL (ref 150–400)
RBC: 3.21 MIL/uL — ABNORMAL LOW (ref 4.22–5.81)
RDW: 12.5 % (ref 11.5–15.5)
WBC: 7.8 10*3/uL (ref 4.0–10.5)
nRBC: 0 % (ref 0.0–0.2)

## 2022-01-25 LAB — MRSA NEXT GEN BY PCR, NASAL: MRSA by PCR Next Gen: NOT DETECTED

## 2022-01-25 MED ORDER — CALCIUM GLUCONATE-NACL 1-0.675 GM/50ML-% IV SOLN
1.0000 g | Freq: Once | INTRAVENOUS | Status: AC
  Administered 2022-01-25: 1000 mg via INTRAVENOUS
  Filled 2022-01-25: qty 50

## 2022-01-25 MED ORDER — SODIUM CHLORIDE 0.9 % IV SOLN: Freq: Once | INTRAVENOUS | Status: AC

## 2022-01-25 NOTE — Plan of Care (Signed)

## 2022-01-25 NOTE — Evaluation (Signed)
Occupational Therapy Evaluation Patient Details Name: Eric Conley MRN: [id number] DOB: Oct 14, 1960 Today's Date: 01/25/2022   History of Present Illness Pt is a 61yo male admitted after fall a work found down in break room. Pt with TBI with loculated L posterior pariental subdural hematoma with mass effect.  Pt also with subarachoid hemorrhage. Pt with  R scapular fx.  Ortho managing with no surgery and WBAT.  PMH: depression, ? alcohol abuse   Clinical Impression   Pt admitted with the above diagnosis and has the deficits outlined below. Pt would benefit from cont OT to increase independence with basic adls, functional mobility and cognitive tasks so pt can eventually return to living alone. Pt lives alone, lost his father in June with whom he lived with. Pt has brother and sister in law that he states cannot stay with him but would like to look into this further and see if they are capable of assisting with pt at d/c. Pt currently is very impulsive and requires min assist with most adls due to pain in R shoulder and impulsiveness.  Pt could go home if someone was there to live with him for short amount of time until pt is safe to be alone again.  Have concerns sending this pt home alone with recent TBI, questionable alcohol abuse and R shoulder fx.  If pt has assist, pt could go home with OPOT. Will need to explore further.       Recommendations for follow up therapy are one component of a multi-disciplinary discharge planning process, led by the attending physician.  Recommendations may be updated based on patient status, additional functional criteria and insurance authorization.   Follow Up Recommendations  Outpatient OT    Assistance Recommended at Discharge Frequent or constant Supervision/Assistance  Patient can return home with the following A little help with walking and/or transfers;A little help with bathing/dressing/bathroom;Assistance with cooking/housework;Direct supervision/assist  for financial management;Assist for transportation;Help with stairs or ramp for entrance;Direct supervision/assist for medications management    Functional Status Assessment  Patient has had a recent decline in their functional status and demonstrates the ability to make significant improvements in function in a reasonable and predictable amount of time.  Equipment Recommendations  Other (comment) (tbd)    Recommendations for Other Services       Precautions / Restrictions Precautions Precautions: Fall Precaution Comments: pt very impulsive Restrictions Weight Bearing Restrictions: Yes RUE Weight Bearing: Weight bearing as tolerated      Mobility Bed Mobility Overal bed mobility: Needs Assistance Bed Mobility: Supine to Sit     Supine to sit: Min guard     General bed mobility comments: assist for safety and lines    Transfers Overall transfer level: Needs assistance Equipment used: 1 person hand held assist Transfers: Sit to/from Stand, Bed to chair/wheelchair/BSC Sit to Stand: Min guard Stand pivot transfers: Min assist         General transfer comment: Pt impulsive with all mobility.  Cues given for hand placement and to slow down.  Talked to pt at length about need to avoid future falls due to diagnosis.      Balance Overall balance assessment: Needs assistance Sitting-balance support: Feet supported Sitting balance-Leahy Scale: Good     Standing balance support: Bilateral upper extremity supported, During functional activity Standing balance-Leahy Scale: Poor Standing balance comment: Pt could stand for moments without assist but was impulsive making pt fall risk when on his feet.  ADL either performed or assessed with clinical judgement   ADL Overall ADL's : Needs assistance/impaired Eating/Feeding: Set up;Sitting   Grooming: Wash/dry hands;Wash/dry face;Min guard;Standing Grooming Details (indicate cue type and  reason): Pt stood at sink for 2 minutes.  C/O dizziness and impulsive with all mobility in bathroom Upper Body Bathing: Set up;Sitting   Lower Body Bathing: Minimal assistance;Sit to/from stand Lower Body Bathing Details (indicate cue type and reason): min assist in standing due to lines and decreased balance. Upper Body Dressing : Minimal assistance;Sitting Upper Body Dressing Details (indicate cue type and reason): due to lines and pain Lower Body Dressing: Minimal assistance;Sit to/from stand Lower Body Dressing Details (indicate cue type and reason): assist in standing due to impulsivity Toilet Transfer: Minimal assistance;Ambulation;Comfort height toilet;Grab bars Toilet Transfer Details (indicate cue type and reason): Pt walked to bathroom to toilet with cues to slow down. Pt with no regard for lines, IVs, or items in his path on way to bathroom. Toileting- Clothing Manipulation and Hygiene: Supervision/safety;Sit to/from stand Toileting - Clothing Manipulation Details (indicate cue type and reason): cues to slow down and take his time     Functional mobility during ADLs: Minimal assistance General ADL Comments: Pt most limited due to impulsivity and decrease balance.     Vision Baseline Vision/History: 0 No visual deficits Ability to See in Adequate Light: 0 Adequate Patient Visual Report: No change from baseline Vision Assessment?: No apparent visual deficits     Perception     Praxis      Pertinent Vitals/Pain Pain Assessment Pain Assessment: 0-10 Pain Score: 9  Pain Location: head and R shoulder Pain Descriptors / Indicators: Aching, Sore, Sharp, Shooting, Discomfort Pain Intervention(s): Premedicated before session, Limited activity within patient's tolerance, Monitored during session, Repositioned     Hand Dominance Right   Extremity/Trunk Assessment Upper Extremity Assessment Upper Extremity Assessment: RUE deficits/detail RUE Deficits / Details: Pt with  scapular fx.  Shoulder flex to 90 and limited in abduction and extension; otherwise RUE WFL. RUE: Unable to fully assess due to pain;Shoulder pain with ROM RUE Sensation: WNL RUE Coordination: decreased gross motor   Lower Extremity Assessment Lower Extremity Assessment: Defer to PT evaluation   Cervical / Trunk Assessment Cervical / Trunk Assessment: Normal   Communication Communication Communication: HOH   Cognition Arousal/Alertness: Awake/alert Behavior During Therapy: Impulsive Overall Cognitive Status: Impaired/Different from baseline Area of Impairment: Memory, Safety/judgement, Awareness                     Memory: Decreased recall of precautions, Decreased short-term memory   Safety/Judgement: Decreased awareness of safety, Decreased awareness of deficits Awareness: Emergent   General Comments: Pt said president was Bush; when redirected said Biden.  Did recall address, phone, 911 number  Pt recalled 2/3 words with 20 min delay.     General Comments  Pt most limited by shoulder pain, impulsivity and decreased insight into deficits.    Exercises     Shoulder Instructions      Home Living Family/patient expects to be discharged to:: Private residence Living Arrangements: Alone Available Help at Discharge: Friend(s);Available PRN/intermittently Type of Home: House Home Access: Ramped entrance     Home Layout: Two level Alternate Level Stairs-Number of Steps: 10 Alternate Level Stairs-Rails: Right Bathroom Shower/Tub: Walk-in shower;Curtain   Bathroom Toilet: Handicapped height     Home Equipment: Shower seat   Additional Comments: Pt works full Engineer, manufacturing      Prior Functioning/Environment Prior Level  of Function : Independent/Modified Independent             Mobility Comments: walked without assistive device ADLs Comments: independent        OT Problem List: Decreased strength;Decreased range of motion;Decreased activity  tolerance;Impaired balance (sitting and/or standing);Decreased cognition;Decreased safety awareness;Decreased knowledge of use of DME or AE;Decreased knowledge of precautions;Impaired UE functional use;Pain      OT Treatment/Interventions: Self-care/ADL training;Cognitive remediation/compensation;Balance training    OT Goals(Current goals can be found in the care plan section) Acute Rehab OT Goals Patient Stated Goal: to get better OT Goal Formulation: With patient Time For Goal Achievement: 02/08/22 Potential to Achieve Goals: Good ADL Goals Additional ADL Goal #1: Pt will gather all clothes and dress self with mod I Additional ADL Goal #2: Pt will complete all toieting tasks independently Additional ADL Goal #3: Cognitive assessment will be completed to assist in directing further cognitive goals  OT Frequency: Min 3X/week    Co-evaluation              AM-PAC OT "6 Clicks" Daily Activity     Outcome Measure Help from another person eating meals?: None Help from another person taking care of personal grooming?: None Help from another person toileting, which includes using toliet, bedpan, or urinal?: A Little Help from another person bathing (including washing, rinsing, drying)?: A Little Help from another person to put on and taking off regular upper body clothing?: A Little Help from another person to put on and taking off regular lower body clothing?: A Little 6 Click Score: 20   End of Session Equipment Utilized During Treatment: Gait belt Nurse Communication: Mobility status  Activity Tolerance: Patient limited by pain Patient left: in chair;with call bell/phone within reach;with chair alarm set  OT Visit Diagnosis: Unsteadiness on feet (R26.81)                Time: 9528-4132 OT Time Calculation (min): 36 min Charges:  OT General Charges $OT Visit: 1 Visit OT Evaluation $OT Eval Moderate Complexity: 1 Mod OT Treatments $Self Care/Home Management : 8-22  mins  Hope Budds 01/25/2022, 3:57 PM

## 2022-01-25 NOTE — Progress Notes (Signed)
Providing Compassionate, Quality Care - Together   Subjective: Patient reports no new issues.  Objective: Vital signs in last 24 hours: Temp:  [98 F (36.7 C)-99.5 F (37.5 C)] 98.4 F (36.9 C) (08/22 1146) Pulse Rate:  [69-94] 74 (08/22 1300) Resp:  [10-24] 20 (08/22 1300) BP: (87-150)/(58-129) 106/73 (08/22 1300) SpO2:  [95 %-100 %] 97 % (08/22 1300) Weight:  [66.7 kg] 66.7 kg (08/21 2045)  Intake/Output from previous day: 08/21 0701 - 08/22 0700 In: 2809.9 [P.O.:240; I.V.:519.9; IV Piggyback:2050] Out: 375 [Urine:375] Intake/Output this shift: Total I/O In: 374.9 [I.V.:374.9] Out: -   Alert and oriented x 4 PERRLA Speech clear CN II-XII grossly intact MAE, Strength and sensation intact   Lab Results: Recent Labs    01/24/22 0800 01/25/22 0232  WBC 12.1* 7.8  HGB 14.5 11.6*  HCT 41.1 32.8*  PLT 231 183   BMET Recent Labs    01/24/22 1700 01/25/22 0232  NA 133* 133*  K 2.9* 3.2*  CL 96* 97*  CO2 27 24  GLUCOSE 108* 110*  BUN 12 9  CREATININE 0.87 0.81  CALCIUM 7.2* 6.9*    Studies/Results: CT HEAD WO CONTRAST  Result Date: 01/25/2022 CLINICAL DATA:  Follow-up subdural hematoma EXAM: CT HEAD WITHOUT CONTRAST TECHNIQUE: Contiguous axial images were obtained from the base of the skull through the vertex without intravenous contrast. RADIATION DOSE REDUCTION: This exam was performed according to the departmental dose-optimization program which includes automated exposure control, adjustment of the mA and/or kV according to patient size and/or use of iterative reconstruction technique. COMPARISON:  Yesterday FINDINGS: Brain: Extra-axial hemorrhage at the left parietal convexity measuring up to 19 mm in thickness with local cortical mass effect, unchanged. Hemorrhagic contusion in the anterior left frontal and temporal lobe without progression. Trace subarachnoid hemorrhage along the contusions and right parasagittal frontal region. No midline shift or  hydrocephalus. Vascular: No hyperdense vessel or unexpected calcification. Skull: Scalp swelling without fracture. Sinuses/Orbits: Negative IMPRESSION: No progression of extra-axial hemorrhage and left frontotemporal contusions. Electronically Signed   By: Tiburcio Pea M.D.   On: 01/25/2022 05:45   CT CHEST ABDOMEN PELVIS W CONTRAST  Result Date: 01/24/2022 CLINICAL DATA:  Fall with scapular fracture. EXAM: CT CHEST, ABDOMEN, AND PELVIS WITH CONTRAST TECHNIQUE: Multidetector CT imaging of the chest, abdomen and pelvis was performed following the standard protocol during bolus administration of intravenous contrast. RADIATION DOSE REDUCTION: This exam was performed according to the departmental dose-optimization program which includes automated exposure control, adjustment of the mA and/or kV according to patient size and/or use of iterative reconstruction technique. CONTRAST:  OMNIPAQUE IOHEXOL 300 MG/ML  SOLN COMPARISON:  Chest x-ray which was acquired on the same date along with neuro axis imaging. FINDINGS: CT CHEST FINDINGS Cardiovascular: Calcified aortic atherosclerosis. Normal heart size without pericardial effusion. Normal caliber of central pulmonary vasculature. No mediastinal hematoma. Smooth contour of the thoracic aorta. Mediastinum/Nodes: No acute process or signs of adenopathy in the mediastinum. Lungs/Pleura: No pneumothorax. Basilar atelectasis. Mild pulmonary emphysema that is worse towards the lung apices. Airways are patent. Musculoskeletal: Please see below for full musculoskeletal details. Comminuted fracture of the RIGHT inferior body of the scapula with surrounding intramuscular hematoma. RIGHT shoulder is located. RIGHT clavicle is intact. Subacute fractures of RIGHT and LEFT-sided ribs, LEFT anterior and lateral eighth rib and RIGHT lateral eighth rib. Sternum is intact. CT ABDOMEN PELVIS FINDINGS Hepatobiliary: Smooth hepatic contours. No signs of suspicious hepatic lesion or  hepatic trauma. No pericholecystic stranding. No  biliary duct dilation. Pancreas: Normal, without mass, inflammation or ductal dilatation. Spleen: Normal. Adrenals/Urinary Tract: Adrenal glands are normal. Smooth renal contours. No suspicious renal lesion. No hydronephrosis. Stomach/Bowel: Suspect gastric rugal thickening which is diffuse. Fold thickening may been present on previous imaging where there was better gastric distension. The stomach is under distended today which makes findings nonspecific. No acute small bowel process. Fluid-filled loops of distal small bowel with mild distension. Liquid stool in the colon proximally with better formed stool in the distal colon. No stranding adjacent to the colon. Normal appendix. Vascular/Lymphatic: Aortic atherosclerosis. No sign of aneurysm. Smooth contour of the IVC. There is no gastrohepatic or hepatoduodenal ligament lymphadenopathy. No retroperitoneal or mesenteric lymphadenopathy. No pelvic sidewall lymphadenopathy. Reproductive: Unremarkable to the extent evaluated on CT. Other: No visible body wall contusion. No signs of ascites or hemoperitoneum. Musculoskeletal: Scapular fracture on the RIGHT comminution and displacement. Rib fractures without displacement as discussed. Sternum intact. Pelvis without signs of fracture with sacro iliac joint symmetry and normal appearance of symphysis pubis. IMPRESSION: 1. Highly comminuted fracture of the RIGHT inferior body of the scapula with surrounding intramuscular hematoma. 2. Subacute fractures of bilateral inferior and lateral ribs. 3. Question of gastric rugal thickening which is diffuse. Fold thickening may been present on previous imaging where there was better gastric distension. Correlate with any symptoms of gastritis or anemia, given persistence of this finding in hindsight over a series of prior studies would consider follow-up endoscopy for further assessment on a nonemergent basis. 4. Emphysema and aortic  atherosclerosis. Aortic Atherosclerosis (ICD10-I70.0) and Emphysema (ICD10-J43.9). Electronically Signed   By: Donzetta Kohut M.D.   On: 01/24/2022 11:34   DG Humerus Right  Result Date: 01/24/2022 CLINICAL DATA:  Right humeral abnormality on radiograph EXAM: RIGHT HUMERUS - 2+ VIEW COMPARISON:  None Available. FINDINGS: There is a displaced osseous fragment about the lateral aspect of the scapula, most consistent with a scapular fracture. No appreciable fracture of the right humerus. IMPRESSION: 1. Displaced osseous fragment about the lateral aspect of the scapula with irregularity of the lateral border of the scapula which is partially imaged, most consistent with a scapular fracture. 2. No appreciable fracture of the right humerus. Electronically Signed   By: Larose Hires D.O.   On: 01/24/2022 10:07   CT Head Wo Contrast  Result Date: 01/24/2022 CLINICAL DATA:  Dizziness, RUE weakness; Neck trauma, focal neuro deficit or paresthesia (Age 14-64y). Syncopal episode 2 days ago. EXAM: CT HEAD WITHOUT CONTRAST CT CERVICAL SPINE WITHOUT CONTRAST TECHNIQUE: Multidetector CT imaging of the head and cervical spine was performed following the standard protocol without intravenous contrast. Multiplanar CT image reconstructions of the cervical spine were also generated. RADIATION DOSE REDUCTION: This exam was performed according to the departmental dose-optimization program which includes automated exposure control, adjustment of the mA and/or kV according to patient size and/or use of iterative reconstruction technique. COMPARISON:  CT head and cervical spine 07/26/2021 FINDINGS: CT HEAD FINDINGS Brain: A biconvex, hyperdense extra-axial hematoma over the left parietal convexity measures 1.9 cm in thickness and may be subdural or epidural. This results in mild regional mass effect without midline shift. A thin subdural hematoma extends more anteriorly along the left lateral cerebral convexity and measures up to 3 mm  in thickness. Small regions of edema anteriorly in the left frontal and left temporal lobes are most compatible with contusions, and there is a small amount of associated subarachnoid and possibly parenchymal hemorrhage in these regions. The ventricles are normal  in size. Vascular: Calcified atherosclerosis at the skull base. No hyperdense vessel. Skull: No acute fracture or suspicious osseous lesion. Sinuses/Orbits: Mild mucosal thickening in the included paranasal sinuses. Clear mastoid air cells. Unremarkable orbits. Other: Mild posterior scalp swelling. CT CERVICAL SPINE FINDINGS Alignment: Mild right convex curvature of the cervical spine. No traumatic subluxation. Skull base and vertebrae: No acute fracture or suspicious osseous lesion. Soft tissues and spinal canal: No prevertebral fluid or swelling. No visible canal hematoma. Disc levels: Moderate disc degeneration in the mid to lower cervical spine and mild facet arthrosis. Mild-to-moderate right neural foraminal stenosis at C6-7 due to uncovertebral spurring. Upper chest: Mild centrilobular and paraseptal emphysema. Other: None. IMPRESSION: 1. Extra-axial hematoma over the left cerebral convexity as above. Mild mass effect without midline shift. 2. Left frontal and temporal lobe contusions with small amount of associated subarachnoid and possibly parenchymal hemorrhage. 3. No skull fracture or cervical spine fracture. 4. Emphysema (ICD10-J43.9). Critical Value/emergent results were called by telephone at the time of interpretation on 01/24/2022 at 9:32 am to Dr. Vonita Moss, who verbally acknowledged these results. Electronically Signed   By: Sebastian Ache M.D.   On: 01/24/2022 09:35   CT Cervical Spine Wo Contrast  Result Date: 01/24/2022 CLINICAL DATA:  Dizziness, RUE weakness; Neck trauma, focal neuro deficit or paresthesia (Age 80-64y). Syncopal episode 2 days ago. EXAM: CT HEAD WITHOUT CONTRAST CT CERVICAL SPINE WITHOUT CONTRAST TECHNIQUE:  Multidetector CT imaging of the head and cervical spine was performed following the standard protocol without intravenous contrast. Multiplanar CT image reconstructions of the cervical spine were also generated. RADIATION DOSE REDUCTION: This exam was performed according to the departmental dose-optimization program which includes automated exposure control, adjustment of the mA and/or kV according to patient size and/or use of iterative reconstruction technique. COMPARISON:  CT head and cervical spine 07/26/2021 FINDINGS: CT HEAD FINDINGS Brain: A biconvex, hyperdense extra-axial hematoma over the left parietal convexity measures 1.9 cm in thickness and may be subdural or epidural. This results in mild regional mass effect without midline shift. A thin subdural hematoma extends more anteriorly along the left lateral cerebral convexity and measures up to 3 mm in thickness. Small regions of edema anteriorly in the left frontal and left temporal lobes are most compatible with contusions, and there is a small amount of associated subarachnoid and possibly parenchymal hemorrhage in these regions. The ventricles are normal in size. Vascular: Calcified atherosclerosis at the skull base. No hyperdense vessel. Skull: No acute fracture or suspicious osseous lesion. Sinuses/Orbits: Mild mucosal thickening in the included paranasal sinuses. Clear mastoid air cells. Unremarkable orbits. Other: Mild posterior scalp swelling. CT CERVICAL SPINE FINDINGS Alignment: Mild right convex curvature of the cervical spine. No traumatic subluxation. Skull base and vertebrae: No acute fracture or suspicious osseous lesion. Soft tissues and spinal canal: No prevertebral fluid or swelling. No visible canal hematoma. Disc levels: Moderate disc degeneration in the mid to lower cervical spine and mild facet arthrosis. Mild-to-moderate right neural foraminal stenosis at C6-7 due to uncovertebral spurring. Upper chest: Mild centrilobular and  paraseptal emphysema. Other: None. IMPRESSION: 1. Extra-axial hematoma over the left cerebral convexity as above. Mild mass effect without midline shift. 2. Left frontal and temporal lobe contusions with small amount of associated subarachnoid and possibly parenchymal hemorrhage. 3. No skull fracture or cervical spine fracture. 4. Emphysema (ICD10-J43.9). Critical Value/emergent results were called by telephone at the time of interpretation on 01/24/2022 at 9:32 am to Dr. Vonita Moss, who verbally acknowledged these results.  Electronically Signed   By: Sebastian Ache M.D.   On: 01/24/2022 09:35   DG Shoulder Right  Result Date: 01/24/2022 CLINICAL DATA:  Larey Seat 2 days ago with shoulder pain EXAM: RIGHT SHOULDER - 2+ VIEW COMPARISON:  None FINDINGS: On the frontal projection, the glenohumeral joint appears normal. The AC joint appears normal. No fracture is seen. On the transscapular Y-view projection, there is suggestion of a segmental cortical fracture of the proximal humeral diaphysis. This would be quite unusual and there is no sign of this on the frontal projection. It could relate to overlying artifact. Particularly if the patient has significant proximal humeral pain, I would suggest repeating the examination versus consider CT. IMPRESSION: See above discussion regarding the possibility of an unusual segmental cortical fracture of the proximal humerus. Electronically Signed   By: Paulina Fusi M.D.   On: 01/24/2022 09:28   DG Chest 2 View  Result Date: 01/24/2022 CLINICAL DATA:  dizziness, confusion, right arm pain, syncope EXAM: CHEST - 2 VIEW COMPARISON:  Chest x-ray 12/30/2021. FINDINGS: The heart size and mediastinal contours are within normal limits. Both lungs are clear. No visible pleural effusions or pneumothorax. No acute osseous abnormality. IMPRESSION: No evidence of acute cardiopulmonary disease. Electronically Signed   By: Feliberto Harts M.D.   On: 01/24/2022 09:22    Assessment/Plan:   Patient has suffered a left frontal contusion and left posterior parietal subdural hematoma following a fall from ground level.  Follow-up imaging from this morning is stable.  He also suffered a scapular fracture, which is being managed conservatively by ortho.   LOS: 1 day   - Patient will mobilize with therapies today.   -We will keep him in the ICU 1 more day.    Val Eagle, DNP, AGNP-C Nurse Practitioner  [facility] [address], [place], [place] [address] P: [phone]    F: [phone]  01/25/2022, 2:17 PM

## 2022-01-25 NOTE — Consult Note (Signed)
Reason for Consult:Right scapula fx Referring Physician: Altamease Oiler Time called: [id number] Time at bedside: 0857   Eric Conley is an 61 y.o. male.  HPI: Eric Conley fell at work, possibly twice, over a span of 2 days. The last time he was found in the break room by coworkers on the floor. He was brought to the ED where x-rays showed a right scapula fx in addition to a TBI and orthopedic surgery was consulted. 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  Past Medical History:  Diagnosis Date   Acid reflux    Depression    Hypertension     History reviewed. No pertinent surgical history.  History reviewed. No pertinent family history.  Social History:  reports that he has been smoking cigarettes. He has been smoking an average of .25 packs per day. He has never used smokeless tobacco. He reports current alcohol use of about 7.0 standard drinks of alcohol per week. He reports that he does not currently use drugs.  Allergies:  Allergies  Allergen Reactions   Codeine    Poison Sumac Extract     Medications: I have reviewed the patient's current medications.  Results for orders placed or performed during the hospital encounter of 01/24/22 (from the past 48 hour(s))  Magnesium     Status: Abnormal   Collection Time: 01/24/22  7:00 AM  Result Value Ref Range   Magnesium 0.8 (LL) 1.7 - 2.4 mg/dL    Comment: CRITICAL RESULT CALLED TO, READ BACK BY AND VERIFIED WITH: MCIVER,M AT 8:50AM ON 01/24/22 BY [facility] Performed at [facility], [address], [place], [place] [address]   Basic metabolic panel     Status: Abnormal   Collection Time: 01/24/22  8:00 AM  Result Value Ref Range   Sodium 131 (L) 135 - 145 mmol/L   Potassium 2.8 (L) 3.5 - 5.1 mmol/L   Chloride 90 (L) 98 - 111 mmol/L   CO2 27 22 - 32 mmol/L   Glucose, Bld 160 (H) 70 - 99 mg/dL    Comment: Glucose reference range applies only to samples taken after fasting for at least 8 hours.   BUN 17 6 - 20 mg/dL   Creatinine, Ser 7.82 (H) 0.61  - 1.24 mg/dL   Calcium 7.3 (L) 8.9 - 10.3 mg/dL   GFR, Estimated 48 (L) >60 mL/min    Comment: (NOTE) Calculated using the CKD-EPI Creatinine Equation (2021)    Anion gap 14 5 - 15    Comment: Performed at [facility], [address], [place], [place] [address]  CBC     Status: Abnormal   Collection Time: 01/24/22  8:00 AM  Result Value Ref Range   WBC 12.1 (H) 4.0 - 10.5 K/uL   RBC 4.04 (L) 4.22 - 5.81 MIL/uL   Hemoglobin 14.5 13.0 - 17.0 g/dL   HCT 30.8 65.7 - 84.6 %   MCV 101.7 (H) 80.0 - 100.0 fL   MCH 35.9 (H) 26.0 - 34.0 pg   MCHC 35.3 30.0 - 36.0 g/dL   RDW 96.2 95.2 - 84.1 %   Platelets 231 150 - 400 K/uL   nRBC 0.0 0.0 - 0.2 %    Comment: Performed at [facility], [address], [place], [place] [address]  Troponin I (High Sensitivity)     Status: None   Collection Time: 01/24/22  8:00 AM  Result Value Ref Range   Troponin I (High Sensitivity) 5 <18 ng/L    Comment: (NOTE) Elevated high  sensitivity troponin I (hsTnI) values and significant  changes across serial measurements may suggest ACS but many other  chronic and acute conditions are known to elevate hsTnI results.  Refer to the "Links" section for chest pain algorithms and additional  guidance. Performed at [facility], [address], [place], [place] [address]   Ethanol     Status: None   Collection Time: 01/24/22  9:18 AM  Result Value Ref Range   Alcohol, Ethyl (B) <10 <10 mg/dL    Comment: (NOTE) Lowest detectable limit for serum alcohol is 10 mg/dL.  For medical purposes only. Performed at [facility], [address] [place], [place] [address]   Ammonia     Status: None   Collection Time: 01/24/22  9:18 AM  Result Value Ref Range   Ammonia <10 9 - 35 umol/L    Comment: Performed at [facility], [address], [place], [place] [address]  Hepatic function panel     Status: Abnormal   Collection Time: 01/24/22  9:18 AM  Result Value Ref Range   Total Protein 8.1 6.5 - 8.1 g/dL   Albumin 4.0  3.5 - 5.0 g/dL   AST 36 15 - 41 U/L   ALT 22 0 - 44 U/L   Alkaline Phosphatase 73 38 - 126 U/L   Total Bilirubin 1.6 (H) 0.3 - 1.2 mg/dL   Bilirubin, Direct 0.4 (H) 0.0 - 0.2 mg/dL   Indirect Bilirubin 1.2 (H) 0.3 - 0.9 mg/dL    Comment: Performed at [facility], [address] [place], [place] [address]  Troponin I (High Sensitivity)     Status: None   Collection Time: 01/24/22  9:47 AM  Result Value Ref Range   Troponin I (High Sensitivity) 5 <18 ng/L    Comment: (NOTE) Elevated high sensitivity troponin I (hsTnI) values and significant  changes across serial measurements may suggest ACS but many other  chronic and acute conditions are known to elevate hsTnI results.  Refer to the "Links" section for chest pain algorithms and additional  guidance. Performed at [facility], [address], [place], [place] [address]   APTT     Status: None   Collection Time: 01/24/22  9:56 AM  Result Value Ref Range   aPTT 25 24 - 36 seconds    Comment: Performed at [facility], [address] [place], [place] [address]  Protime-INR     Status: None   Collection Time: 01/24/22  9:56 AM  Result Value Ref Range   Prothrombin Time 13.4 11.4 - 15.2 seconds   INR 1.0 0.8 - 1.2    Comment: (NOTE) INR goal varies based on device and disease states. Performed at [facility], [address], [place], [place] [address]   Urinalysis, Routine w reflex microscopic Urine, Clean Catch     Status: Abnormal   Collection Time: 01/24/22  4:21 PM  Result Value Ref Range   Color, Urine YELLOW YELLOW   APPearance CLEAR CLEAR   Specific Gravity, Urine >1.046 (H) 1.005 - 1.030   pH 5.0 5.0 - 8.0   Glucose, UA NEGATIVE NEGATIVE mg/dL   Hgb urine dipstick SMALL (A) NEGATIVE   Bilirubin Urine NEGATIVE NEGATIVE   Ketones, ur NEGATIVE NEGATIVE mg/dL   Protein, ur 30 (A) NEGATIVE mg/dL   Nitrite NEGATIVE NEGATIVE   Leukocytes,Ua NEGATIVE NEGATIVE   WBC, UA 0-5 0 - 5 WBC/hpf   Bacteria, UA NONE SEEN NONE SEEN    Mucus PRESENT    Hyaline  Casts, UA PRESENT     Comment: Performed at [facility], [address], [place], [place] [address]  Rapid urine drug screen (hospital performed)     Status: Abnormal   Collection Time: 01/24/22  4:21 PM  Result Value Ref Range   Opiates NONE DETECTED NONE DETECTED   Cocaine NONE DETECTED NONE DETECTED   Benzodiazepines POSITIVE (A) NONE DETECTED   Amphetamines NONE DETECTED NONE DETECTED   Tetrahydrocannabinol NONE DETECTED NONE DETECTED   Barbiturates NONE DETECTED NONE DETECTED    Comment: (NOTE) DRUG SCREEN FOR MEDICAL PURPOSES ONLY.  IF CONFIRMATION IS NEEDED FOR ANY PURPOSE, NOTIFY LAB WITHIN 5 DAYS.  LOWEST DETECTABLE LIMITS FOR URINE DRUG SCREEN Drug Class                     Cutoff (ng/mL) Amphetamine and metabolites    1000 Barbiturate and metabolites    200 Benzodiazepine                 200 Tricyclics and metabolites     300 Opiates and metabolites        300 Cocaine and metabolites        300 THC                            50 Performed at [facility], [address] [place], [place] [address]   Basic metabolic panel     Status: Abnormal   Collection Time: 01/24/22  5:00 PM  Result Value Ref Range   Sodium 133 (L) 135 - 145 mmol/L   Potassium 2.9 (L) 3.5 - 5.1 mmol/L   Chloride 96 (L) 98 - 111 mmol/L   CO2 27 22 - 32 mmol/L   Glucose, Bld 108 (H) 70 - 99 mg/dL    Comment: Glucose reference range applies only to samples taken after fasting for at least 8 hours.   BUN 12 6 - 20 mg/dL   Creatinine, Ser 5.64 0.61 - 1.24 mg/dL   Calcium 7.2 (L) 8.9 - 10.3 mg/dL   GFR, Estimated >33 >29 mL/min    Comment: (NOTE) Calculated using the CKD-EPI Creatinine Equation (2021)    Anion gap 10 5 - 15    Comment: Performed at [facility], [address], [place], [place] [address]  Magnesium     Status: None   Collection Time: 01/24/22  5:00 PM  Result Value Ref Range   Magnesium 2.3 1.7 - 2.4 mg/dL    Comment: Performed at [facility],  [address] [place], [place] [address]  MRSA Next Gen by PCR, Nasal     Status: None   Collection Time: 01/24/22 10:55 PM   Specimen: Nasal Mucosa; Nasal Swab  Result Value Ref Range   MRSA by PCR Next Gen NOT DETECTED NOT DETECTED    Comment: (NOTE) The GeneXpert MRSA Assay (FDA approved for NASAL specimens only), is one component of a comprehensive MRSA colonization surveillance program. It is not intended to diagnose MRSA infection nor to guide or monitor treatment for MRSA infections. Test performance is not FDA approved in patients less than 15 years old. Performed at [facility], [address], [place], [place] [address]   Basic metabolic panel     Status: Abnormal   Collection Time: 01/25/22  2:32 AM  Result Value Ref Range   Sodium 133 (L) 135 - 145 mmol/L   Potassium 3.2 (L) 3.5 - 5.1 mmol/L  Chloride 97 (L) 98 - 111 mmol/L   CO2 24 22 - 32 mmol/L   Glucose, Bld 110 (H) 70 - 99 mg/dL    Comment: Glucose reference range applies only to samples taken after fasting for at least 8 hours.   BUN 9 6 - 20 mg/dL   Creatinine, Ser 4.09 0.61 - 1.24 mg/dL   Calcium 6.9 (L) 8.9 - 10.3 mg/dL   GFR, Estimated >81 >19 mL/min    Comment: (NOTE) Calculated using the CKD-EPI Creatinine Equation (2021)    Anion gap 12 5 - 15    Comment: Performed at [facility], [address] [place], [place] [address]  CBC     Status: Abnormal   Collection Time: 01/25/22  2:32 AM  Result Value Ref Range   WBC 7.8 4.0 - 10.5 K/uL   RBC 3.21 (L) 4.22 - 5.81 MIL/uL   Hemoglobin 11.6 (L) 13.0 - 17.0 g/dL   HCT 95.6 (L) 21.3 - 08.6 %   MCV 102.2 (H) 80.0 - 100.0 fL   MCH 36.1 (H) 26.0 - 34.0 pg   MCHC 35.4 30.0 - 36.0 g/dL   RDW 57.8 46.9 - 62.9 %   Platelets 183 150 - 400 K/uL   nRBC 0.0 0.0 - 0.2 %    Comment: Performed at [facility], [address], [place], [place] [address]    CT HEAD WO CONTRAST  Result Date: 01/25/2022 CLINICAL DATA:  Follow-up subdural hematoma EXAM:  CT HEAD WITHOUT CONTRAST TECHNIQUE: Contiguous axial images were obtained from the base of the skull through the vertex without intravenous contrast. RADIATION DOSE REDUCTION: This exam was performed according to the departmental dose-optimization program which includes automated exposure control, adjustment of the mA and/or kV according to patient size and/or use of iterative reconstruction technique. COMPARISON:  Yesterday FINDINGS: Brain: Extra-axial hemorrhage at the left parietal convexity measuring up to 19 mm in thickness with local cortical mass effect, unchanged. Hemorrhagic contusion in the anterior left frontal and temporal lobe without progression. Trace subarachnoid hemorrhage along the contusions and right parasagittal frontal region. No midline shift or hydrocephalus. Vascular: No hyperdense vessel or unexpected calcification. Skull: Scalp swelling without fracture. Sinuses/Orbits: Negative IMPRESSION: No progression of extra-axial hemorrhage and left frontotemporal contusions. Electronically Signed   By: Tiburcio Pea M.D.   On: 01/25/2022 05:45   CT CHEST ABDOMEN PELVIS W CONTRAST  Result Date: 01/24/2022 CLINICAL DATA:  Fall with scapular fracture. EXAM: CT CHEST, ABDOMEN, AND PELVIS WITH CONTRAST TECHNIQUE: Multidetector CT imaging of the chest, abdomen and pelvis was performed following the standard protocol during bolus administration of intravenous contrast. RADIATION DOSE REDUCTION: This exam was performed according to the departmental dose-optimization program which includes automated exposure control, adjustment of the mA and/or kV according to patient size and/or use of iterative reconstruction technique. CONTRAST:  OMNIPAQUE IOHEXOL 300 MG/ML  SOLN COMPARISON:  Chest x-ray which was acquired on the same date along with neuro axis imaging. FINDINGS: CT CHEST FINDINGS Cardiovascular: Calcified aortic atherosclerosis. Normal heart size without pericardial effusion. Normal caliber  of central pulmonary vasculature. No mediastinal hematoma. Smooth contour of the thoracic aorta. Mediastinum/Nodes: No acute process or signs of adenopathy in the mediastinum. Lungs/Pleura: No pneumothorax. Basilar atelectasis. Mild pulmonary emphysema that is worse towards the lung apices. Airways are patent. Musculoskeletal: Please see below for full musculoskeletal details. Comminuted fracture of the RIGHT inferior body of the scapula with surrounding intramuscular hematoma. RIGHT shoulder is located. RIGHT clavicle is  intact. Subacute fractures of RIGHT and LEFT-sided ribs, LEFT anterior and lateral eighth rib and RIGHT lateral eighth rib. Sternum is intact. CT ABDOMEN PELVIS FINDINGS Hepatobiliary: Smooth hepatic contours. No signs of suspicious hepatic lesion or hepatic trauma. No pericholecystic stranding. No biliary duct dilation. Pancreas: Normal, without mass, inflammation or ductal dilatation. Spleen: Normal. Adrenals/Urinary Tract: Adrenal glands are normal. Smooth renal contours. No suspicious renal lesion. No hydronephrosis. Stomach/Bowel: Suspect gastric rugal thickening which is diffuse. Fold thickening may been present on previous imaging where there was better gastric distension. The stomach is under distended today which makes findings nonspecific. No acute small bowel process. Fluid-filled loops of distal small bowel with mild distension. Liquid stool in the colon proximally with better formed stool in the distal colon. No stranding adjacent to the colon. Normal appendix. Vascular/Lymphatic: Aortic atherosclerosis. No sign of aneurysm. Smooth contour of the IVC. There is no gastrohepatic or hepatoduodenal ligament lymphadenopathy. No retroperitoneal or mesenteric lymphadenopathy. No pelvic sidewall lymphadenopathy. Reproductive: Unremarkable to the extent evaluated on CT. Other: No visible body wall contusion. No signs of ascites or hemoperitoneum. Musculoskeletal: Scapular fracture on the RIGHT  comminution and displacement. Rib fractures without displacement as discussed. Sternum intact. Pelvis without signs of fracture with sacro iliac joint symmetry and normal appearance of symphysis pubis. IMPRESSION: 1. Highly comminuted fracture of the RIGHT inferior body of the scapula with surrounding intramuscular hematoma. 2. Subacute fractures of bilateral inferior and lateral ribs. 3. Question of gastric rugal thickening which is diffuse. Fold thickening may been present on previous imaging where there was better gastric distension. Correlate with any symptoms of gastritis or anemia, given persistence of this finding in hindsight over a series of prior studies would consider follow-up endoscopy for further assessment on a nonemergent basis. 4. Emphysema and aortic atherosclerosis. Aortic Atherosclerosis (ICD10-I70.0) and Emphysema (ICD10-J43.9). Electronically Signed   By: Donzetta Kohut M.D.   On: 01/24/2022 11:34   DG Humerus Right  Result Date: 01/24/2022 CLINICAL DATA:  Right humeral abnormality on radiograph EXAM: RIGHT HUMERUS - 2+ VIEW COMPARISON:  None Available. FINDINGS: There is a displaced osseous fragment about the lateral aspect of the scapula, most consistent with a scapular fracture. No appreciable fracture of the right humerus. IMPRESSION: 1. Displaced osseous fragment about the lateral aspect of the scapula with irregularity of the lateral border of the scapula which is partially imaged, most consistent with a scapular fracture. 2. No appreciable fracture of the right humerus. Electronically Signed   By: Larose Hires D.O.   On: 01/24/2022 10:07   CT Head Wo Contrast  Result Date: 01/24/2022 CLINICAL DATA:  Dizziness, RUE weakness; Neck trauma, focal neuro deficit or paresthesia (Age 41-64y). Syncopal episode 2 days ago. EXAM: CT HEAD WITHOUT CONTRAST CT CERVICAL SPINE WITHOUT CONTRAST TECHNIQUE: Multidetector CT imaging of the head and cervical spine was performed following the standard  protocol without intravenous contrast. Multiplanar CT image reconstructions of the cervical spine were also generated. RADIATION DOSE REDUCTION: This exam was performed according to the departmental dose-optimization program which includes automated exposure control, adjustment of the mA and/or kV according to patient size and/or use of iterative reconstruction technique. COMPARISON:  CT head and cervical spine 07/26/2021 FINDINGS: CT HEAD FINDINGS Brain: A biconvex, hyperdense extra-axial hematoma over the left parietal convexity measures 1.9 cm in thickness and may be subdural or epidural. This results in mild regional mass effect without midline shift. A thin subdural hematoma extends more anteriorly along the left lateral cerebral convexity and measures up  to 3 mm in thickness. Small regions of edema anteriorly in the left frontal and left temporal lobes are most compatible with contusions, and there is a small amount of associated subarachnoid and possibly parenchymal hemorrhage in these regions. The ventricles are normal in size. Vascular: Calcified atherosclerosis at the skull base. No hyperdense vessel. Skull: No acute fracture or suspicious osseous lesion. Sinuses/Orbits: Mild mucosal thickening in the included paranasal sinuses. Clear mastoid air cells. Unremarkable orbits. Other: Mild posterior scalp swelling. CT CERVICAL SPINE FINDINGS Alignment: Mild right convex curvature of the cervical spine. No traumatic subluxation. Skull base and vertebrae: No acute fracture or suspicious osseous lesion. Soft tissues and spinal canal: No prevertebral fluid or swelling. No visible canal hematoma. Disc levels: Moderate disc degeneration in the mid to lower cervical spine and mild facet arthrosis. Mild-to-moderate right neural foraminal stenosis at C6-7 due to uncovertebral spurring. Upper chest: Mild centrilobular and paraseptal emphysema. Other: None. IMPRESSION: 1. Extra-axial hematoma over the left cerebral  convexity as above. Mild mass effect without midline shift. 2. Left frontal and temporal lobe contusions with small amount of associated subarachnoid and possibly parenchymal hemorrhage. 3. No skull fracture or cervical spine fracture. 4. Emphysema (ICD10-J43.9). Critical Value/emergent results were called by telephone at the time of interpretation on 01/24/2022 at 9:32 am to Dr. Vonita Moss, who verbally acknowledged these results. Electronically Signed   By: Sebastian Ache M.D.   On: 01/24/2022 09:35   CT Cervical Spine Wo Contrast  Result Date: 01/24/2022 CLINICAL DATA:  Dizziness, RUE weakness; Neck trauma, focal neuro deficit or paresthesia (Age 24-64y). Syncopal episode 2 days ago. EXAM: CT HEAD WITHOUT CONTRAST CT CERVICAL SPINE WITHOUT CONTRAST TECHNIQUE: Multidetector CT imaging of the head and cervical spine was performed following the standard protocol without intravenous contrast. Multiplanar CT image reconstructions of the cervical spine were also generated. RADIATION DOSE REDUCTION: This exam was performed according to the departmental dose-optimization program which includes automated exposure control, adjustment of the mA and/or kV according to patient size and/or use of iterative reconstruction technique. COMPARISON:  CT head and cervical spine 07/26/2021 FINDINGS: CT HEAD FINDINGS Brain: A biconvex, hyperdense extra-axial hematoma over the left parietal convexity measures 1.9 cm in thickness and may be subdural or epidural. This results in mild regional mass effect without midline shift. A thin subdural hematoma extends more anteriorly along the left lateral cerebral convexity and measures up to 3 mm in thickness. Small regions of edema anteriorly in the left frontal and left temporal lobes are most compatible with contusions, and there is a small amount of associated subarachnoid and possibly parenchymal hemorrhage in these regions. The ventricles are normal in size. Vascular: Calcified  atherosclerosis at the skull base. No hyperdense vessel. Skull: No acute fracture or suspicious osseous lesion. Sinuses/Orbits: Mild mucosal thickening in the included paranasal sinuses. Clear mastoid air cells. Unremarkable orbits. Other: Mild posterior scalp swelling. CT CERVICAL SPINE FINDINGS Alignment: Mild right convex curvature of the cervical spine. No traumatic subluxation. Skull base and vertebrae: No acute fracture or suspicious osseous lesion. Soft tissues and spinal canal: No prevertebral fluid or swelling. No visible canal hematoma. Disc levels: Moderate disc degeneration in the mid to lower cervical spine and mild facet arthrosis. Mild-to-moderate right neural foraminal stenosis at C6-7 due to uncovertebral spurring. Upper chest: Mild centrilobular and paraseptal emphysema. Other: None. IMPRESSION: 1. Extra-axial hematoma over the left cerebral convexity as above. Mild mass effect without midline shift. 2. Left frontal and temporal lobe contusions with small amount of  associated subarachnoid and possibly parenchymal hemorrhage. 3. No skull fracture or cervical spine fracture. 4. Emphysema (ICD10-J43.9). Critical Value/emergent results were called by telephone at the time of interpretation on 01/24/2022 at 9:32 am to Dr. Vonita Mossobert Paterson, who verbally acknowledged these results. Electronically Signed   By: Sebastian AcheAllen  Grady M.D.   On: 01/24/2022 09:35   DG Shoulder Right  Result Date: 01/24/2022 CLINICAL DATA:  Larey SeatFell 2 days ago with shoulder pain EXAM: RIGHT SHOULDER - 2+ VIEW COMPARISON:  None FINDINGS: On the frontal projection, the glenohumeral joint appears normal. The AC joint appears normal. No fracture is seen. On the transscapular Y-view projection, there is suggestion of a segmental cortical fracture of the proximal humeral diaphysis. This would be quite unusual and there is no sign of this on the frontal projection. It could relate to overlying artifact. Particularly if the patient has  significant proximal humeral pain, I would suggest repeating the examination versus consider CT. IMPRESSION: See above discussion regarding the possibility of an unusual segmental cortical fracture of the proximal humerus. Electronically Signed   By: Paulina FusiMark  Shogry M.D.   On: 01/24/2022 09:28   DG Chest 2 View  Result Date: 01/24/2022 CLINICAL DATA:  dizziness, confusion, right arm pain, syncope EXAM: CHEST - 2 VIEW COMPARISON:  Chest x-ray 12/30/2021. FINDINGS: The heart size and mediastinal contours are within normal limits. Both lungs are clear. No visible pleural effusions or pneumothorax. No acute osseous abnormality. IMPRESSION: No evidence of acute cardiopulmonary disease. Electronically Signed   By: Feliberto HartsFrederick S Jones M.D.   On: 01/24/2022 09:22    Review of Systems  HENT:  Negative for ear discharge, ear pain, hearing loss and tinnitus.   Eyes:  Negative for photophobia and pain.  Respiratory:  Negative for cough and shortness of breath.   Cardiovascular:  Negative for chest pain.  Gastrointestinal:  Negative for abdominal pain, nausea and vomiting.  Genitourinary:  Negative for dysuria, flank pain, frequency and urgency.  Musculoskeletal:  Positive for arthralgias (Right shoulder). Negative for back pain, myalgias and neck pain.  Neurological:  Negative for dizziness and headaches.  Hematological:  Does not bruise/bleed easily.  Psychiatric/Behavioral:  The patient is not nervous/anxious.    Blood pressure 101/70, pulse 87, temperature 98.9 F (37.2 C), temperature source Axillary, resp. rate 18, height 5\' 9"  (1.753 m), weight 66.7 kg, SpO2 98 %. Physical Exam Constitutional:      General: He is not in acute distress.    Appearance: He is well-developed. He is not diaphoretic.  HENT:     Head: Normocephalic and atraumatic.  Eyes:     General: No scleral icterus.       Right eye: No discharge.        Left eye: No discharge.     Conjunctiva/sclera: Conjunctivae normal.   Cardiovascular:     Rate and Rhythm: Normal rate and regular rhythm.  Pulmonary:     Effort: Pulmonary effort is normal. No respiratory distress.  Musculoskeletal:     Cervical back: Normal range of motion.     Comments: Right shoulder, elbow, wrist, digits- no skin wounds, nontender, no instability, no blocks to motion  Sens  Ax/R/M/U intact  Mot   Ax/ R/ PIN/ M/ AIN/ U intact  Rad 2+  Skin:    General: Skin is warm and dry.  Neurological:     Mental Status: He is alert.  Psychiatric:        Mood and Affect: Mood normal.  Behavior: Behavior normal.     Assessment/Plan: Right scapula fx -- Plan non-operative management with WBAT RUE. F/u with Dr. Carola Frost in 3 weeks.    Freeman Caldron, PA-C Orthopedic Surgery [phone] 01/25/2022, 9:04 AM

## 2022-01-25 NOTE — Progress Notes (Signed)
Pharmacy Electrolyte Replacement  Recent Labs:  Recent Labs    01/24/22 1700 01/25/22 0232  K 2.9* 3.2*  MG 2.3  --   CREATININE 0.87 0.81    Low Critical Values (K </= 2.5, Phos </= 1, Mg </= 1) Present: None  Plan: Calcium 6.9 corrected is still 6.9 due to albumin of 4.0.  Replete with 1 g calcium gluconate and check ical. Added CMP to AM labs on 8/23 to assess potassium. Patient has BID ordered to KCl in fluids.

## 2022-01-26 LAB — BASIC METABOLIC PANEL
Anion gap: 6 (ref 5–15)
BUN: <5 mg/dL — ABNORMAL LOW (ref 6–20)
CO2: 24 mmol/L (ref 22–32)
Calcium: 7.6 mg/dL — ABNORMAL LOW (ref 8.9–10.3)
Chloride: 104 mmol/L (ref 98–111)
Creatinine, Ser: 0.67 mg/dL (ref 0.61–1.24)
GFR, Estimated: >60 mL/min (ref >60)
Glucose, Bld: 151 mg/dL — ABNORMAL HIGH (ref 70–99)
Potassium: 3.7 mmol/L (ref 3.5–5.1)
Sodium: 134 mmol/L — ABNORMAL LOW (ref 135–145)

## 2022-01-26 LAB — CALCIUM, IONIZED: Calcium, Ionized, Serum: 4.1 mg/dL — ABNORMAL LOW (ref 4.5–5.6)

## 2022-01-26 NOTE — Progress Notes (Signed)
Overall stable.  No new issues or problems overnight.  Patient notes some headache.  Continues to have shoulder pain and weakness.  No other complaints.  Afebrile.  Vital signs are stable.  Urine output good.  Labs good.  Awake and alert.  Oriented and appropriate.  Speech is fluent.  Judgment insight appear reasonably intact.  Motor and sensory function intact except right shoulder weakness.  Abdomen soft.  Progressing well.  Plan to transfer out of unit today.  Saline lock IVs and mobilize further.

## 2022-01-26 NOTE — Evaluation (Addendum)
Physical Therapy Evaluation Patient Details Name: Eric Conley MRN: [id number] DOB: 1961-01-03 Today's Date: 01/26/2022  History of Present Illness  Pt is a 61yo male admitted 01/24/22 after fall at work found down in break room. Pt with TBI with loculated L posterior pariental subdural hematoma with mass effect.  Pt also with subarachoid hemorrhage. Pt with  R scapular fx.  Ortho managing with no surgery and WBAT.  PMH: depression, ? alcohol abuse   Clinical Impression  Pt presents with condition above and deficits mentioned below, see PT Problem List. PTA, he was IND without DME, working, and living alone in a house with a ramped entrance. Currently, pt maintains a flat affect and cannot recall what made him fall. He demonstrates some slow processing and possible STM deficits, but was appropriate in following directions and not impulsive today. He demonstrates [facility] and intact bil lower extremity strength and sensation, but some incoordination was noted in his L lower extremity. He was able to perform all functional mobility without physical assistance, but demonstrated balance deficits that appeared to be primarily due to his dizziness/lightheadedness (BP/VSS) with turns or changes in head position in all directions. He was able to recover his LOB with reactional step strategies. At this time, recommending continual assistance at home until pt's cognition and functional deficits improve for him to be home alone again. Recommending OPPT follow-up at a neuro clinic, but if pt progresses quickly he may not need any follow-up. Will continue to follow acutely to assess.       Recommendations for follow up therapy are one component of a multi-disciplinary discharge planning process, led by the attending physician.  Recommendations may be updated based on patient status, additional functional criteria and insurance authorization.  Follow Up Recommendations Outpatient PT (neuro vs no PT follow up pending  progress)      Assistance Recommended at Discharge Frequent or constant Supervision/Assistance (initially)  Patient can return home with the following  A little help with walking and/or transfers;A little help with bathing/dressing/bathroom;Assistance with cooking/housework;Direct supervision/assist for medications management;Direct supervision/assist for financial management;Assist for transportation;Help with stairs or ramp for entrance    Equipment Recommendations None recommended by PT  Recommendations for Other Services       Functional Status Assessment Patient has had a recent decline in their functional status and demonstrates the ability to make significant improvements in function in a reasonable and predictable amount of time.     Precautions / Restrictions Precautions Precautions: Fall Restrictions Weight Bearing Restrictions: Yes RUE Weight Bearing: Weight bearing as tolerated      Mobility  Bed Mobility Overal bed mobility: Needs Assistance Bed Mobility: Supine to Sit     Supine to sit: Supervision, HOB elevated     General bed mobility comments: Supervision for safety and line management.    Transfers Overall transfer level: Needs assistance Equipment used: None Transfers: Sit to/from Stand Sit to Stand: Min guard           General transfer comment: Min guard assist for safety coming to stand from EOB, no LOB.    Ambulation/Gait Ambulation/Gait assistance: Min guard Gait Distance (Feet): 340 Feet Assistive device: None Gait Pattern/deviations: Step-through pattern, Decreased stride length, Wide base of support Gait velocity: reduced Gait velocity interpretation: 1.31 - 2.62 ft/sec, indicative of limited community ambulator   General Gait Details: Pt with wide stance and almost a waddle-like gait. Pt reported he did not do this PTA, but reports feeling lightheaded/dizzy today, which worsened when turning  or looking L <> R, min guard assist for  safety as he would take reactional steps when off balance with turns.  Stairs            Wheelchair Mobility    Modified Rankin (Stroke Patients Only) Modified Rankin (Stroke Patients Only) Pre-Morbid Rankin Score: No symptoms Modified Rankin: Moderately severe disability     Balance Overall balance assessment: Needs assistance Sitting-balance support: Feet supported Sitting balance-Leahy Scale: Good     Standing balance support: No upper extremity supported, During functional activity Standing balance-Leahy Scale: Fair Standing balance comment: Able to ambulate without UE support, but displays staggering/LOB with turns or head looking L or R, min guard for safety.                             Pertinent Vitals/Pain Pain Assessment Pain Assessment: Faces Faces Pain Scale: Hurts little more Pain Location: R shoulder, head Pain Descriptors / Indicators: Discomfort, Grimacing, Guarding, Sore Pain Intervention(s): Limited activity within patient's tolerance, Monitored during session, Repositioned    Home Living Family/patient expects to be discharged to:: Private residence Living Arrangements: Alone Available Help at Discharge: Friend(s);Available PRN/intermittently;Neighbor Type of Home: House Home Access: Ramped entrance     Alternate Level Stairs-Number of Steps: 10 Home Layout: Two level Home Equipment: Shower seat Additional Comments: Pt works full Engineer, manufacturing    Prior Function Prior Level of Function : Independent/Modified Independent             Mobility Comments: walked without assistive device ADLs Comments: independent     Hand Dominance   Dominant Hand: Right    Extremity/Trunk Assessment   Upper Extremity Assessment Upper Extremity Assessment: Defer to OT evaluation    Lower Extremity Assessment Lower Extremity Assessment: LLE deficits/detail LLE Deficits / Details: dysdiadochokinesia noted, but otherwise [facility] for bil  lower extremity strength and intact sensation bil LLE Sensation: WNL LLE Coordination: decreased gross motor    Cervical / Trunk Assessment Cervical / Trunk Assessment: Normal  Communication   Communication: HOH  Cognition Arousal/Alertness: Awake/alert Behavior During Therapy: Flat affect Overall Cognitive Status: Impaired/Different from baseline Area of Impairment: Memory, Safety/judgement, Awareness, Problem solving                     Memory: Decreased short-term memory   Safety/Judgement: Decreased awareness of safety, Decreased awareness of deficits Awareness: Emergent Problem Solving: Slow processing General Comments: Pt with flat affect throughout. Needed repetition of questions to process how is currently functioning and compare to his PLOF to identify if he is at his baseline. Slow processing noted.        General Comments General comments (skin integrity, edema, etc.): VSS, including BP with changes in position but pt reports dizziness/lightheadedness at start and remained throughout session    Exercises     Assessment/Plan    PT Assessment Patient needs continued PT services  PT Problem List Decreased activity tolerance;Decreased balance;Decreased mobility;Decreased cognition;Decreased coordination       PT Treatment Interventions DME instruction;Gait training;Functional mobility training;Therapeutic exercise;Therapeutic activities;Neuromuscular re-education;Balance training;Cognitive remediation;Patient/family education    PT Goals (Current goals can be found in the Care Plan section)  Acute Rehab PT Goals Patient Stated Goal: agreeable to session PT Goal Formulation: With patient Time For Goal Achievement: 02/09/22 Potential to Achieve Goals: Good    Frequency Min 4X/week     Co-evaluation  AM-PAC PT "6 Clicks" Mobility  Outcome Measure Help needed turning from your back to your side while in a flat bed without using  bedrails?: A Little Help needed moving from lying on your back to sitting on the side of a flat bed without using bedrails?: A Little Help needed moving to and from a bed to a chair (including a wheelchair)?: A Little Help needed standing up from a chair using your arms (e.g., wheelchair or bedside chair)?: A Little Help needed to walk in hospital room?: A Little Help needed climbing 3-5 steps with a railing? : A Little 6 Click Score: 18    End of Session Equipment Utilized During Treatment: Gait belt Activity Tolerance: Patient tolerated treatment well Patient left: in chair;with call bell/phone within reach;with chair alarm set Nurse Communication: Mobility status PT Visit Diagnosis: Unsteadiness on feet (R26.81);Other abnormalities of gait and mobility (R26.89);Difficulty in walking, not elsewhere classified (R26.2);Dizziness and giddiness (R42)    Time: [id number] PT Time Calculation (min) (ACUTE ONLY): 17 min   Charges:   PT Evaluation $PT Eval Moderate Complexity: 1 Mod          Moishe Spice, PT, DPT Acute Rehabilitation Services  Office: [phone]   Orvan Falconer 01/26/2022, 5:24 PM

## 2022-01-27 LAB — BASIC METABOLIC PANEL
Anion gap: 8 (ref 5–15)
BUN: <5 mg/dL — ABNORMAL LOW (ref 6–20)
CO2: 24 mmol/L (ref 22–32)
Calcium: 8.2 mg/dL — ABNORMAL LOW (ref 8.9–10.3)
Chloride: 103 mmol/L (ref 98–111)
Creatinine, Ser: 0.63 mg/dL (ref 0.61–1.24)
GFR, Estimated: >60 mL/min (ref >60)
Glucose, Bld: 100 mg/dL — ABNORMAL HIGH (ref 70–99)
Potassium: 3.6 mmol/L (ref 3.5–5.1)
Sodium: 135 mmol/L (ref 135–145)

## 2022-01-27 LAB — MAGNESIUM: Magnesium: 1.3 mg/dL — ABNORMAL LOW (ref 1.7–2.4)

## 2022-01-27 NOTE — Progress Notes (Signed)
Physical Therapy Treatment Patient Details Name: Eric Conley MRN: [id number] DOB: 12/12/1960 Today's Date: 01/27/2022   History of Present Illness Pt is a 60yo male admitted 01/24/22 after fall at work found down in break room. Pt with TBI with loculated L posterior pariental subdural hematoma with mass effect.  Pt also with subarachoid hemorrhage. Pt with  R scapular fx.  Ortho managing with no surgery and WBAT.  PMH: depression, ? alcohol abuse    PT Comments    Pt continues to report dizziness that impacts his balance. In addition, pt reports head and R UE pain and lower extremity stiffness. He displayed an antalgic-like gait pattern today with reduced stance time on his L lower extremity. His stability has improved in that he is no longer drastically stumbling when turning or changing his head position, but he still displays some mild balance deficits. Thus, continue to recommend neuro OPPT follow-up. Will continue to follow acutely.    Recommendations for follow up therapy are one component of a multi-disciplinary discharge planning process, led by the attending physician.  Recommendations may be updated based on patient status, additional functional criteria and insurance authorization.  Follow Up Recommendations  Outpatient PT (neuro)     Assistance Recommended at Discharge Frequent or constant Supervision/Assistance (initially)  Patient can return home with the following A little help with walking and/or transfers;A little help with bathing/dressing/bathroom;Assistance with cooking/housework;Direct supervision/assist for medications management;Direct supervision/assist for financial management;Assist for transportation;Help with stairs or ramp for entrance   Equipment Recommendations  None recommended by PT    Recommendations for Other Services       Precautions / Restrictions Precautions Precautions: Fall Restrictions Weight Bearing Restrictions: Yes RUE Weight Bearing:  Weight bearing as tolerated     Mobility  Bed Mobility Overal bed mobility: Needs Assistance Bed Mobility: Supine to Sit, Sit to Supine     Supine to sit: Supervision, HOB elevated Sit to supine: Supervision, HOB elevated   General bed mobility comments: Supervision for safety, appeared to get a little dizzy upon first sitting up    Transfers Overall transfer level: Needs assistance Equipment used: None Transfers: Sit to/from Stand Sit to Stand: Min guard           General transfer comment: Min guard assist for safety coming to stand from EOB, mild instability noted as he quickly began to walk once standing. Educated pt to pause to gain balance before taking off when first coming to stand.    Ambulation/Gait Ambulation/Gait assistance: Min guard Gait Distance (Feet): 600 Feet Assistive device: None Gait Pattern/deviations: Step-through pattern, Decreased stride length, Wide base of support, Antalgic, Decreased stance time - left, Decreased step length - right, Decreased dorsiflexion - right Gait velocity: reduced Gait velocity interpretation: 1.31 - 2.62 ft/sec, indicative of limited community ambulator   General Gait Details: Pt with wide stance and almost a waddle-like gait. Noted almost antalgic-like gait pattern with decreased L stance time and R step length, but reported he just felt stiff. Cued pt to increase R foot clearance with gait, mod momentary success noted. No LOB, min guard for safety   Stairs             Wheelchair Mobility    Modified Rankin (Stroke Patients Only) Modified Rankin (Stroke Patients Only) Pre-Morbid Rankin Score: No symptoms Modified Rankin: Moderately severe disability     Balance Overall balance assessment: Needs assistance Sitting-balance support: Feet supported Sitting balance-Leahy Scale: Good     Standing balance support: No  upper extremity supported, During functional activity Standing balance-Leahy Scale:  Fair Standing balance comment: Able to ambulate without UE support, but mild instability noted, no LOB                            Cognition Arousal/Alertness: Awake/alert Behavior During Therapy: Flat affect Overall Cognitive Status: Impaired/Different from baseline Area of Impairment: Safety/judgement, Awareness, Problem solving                         Safety/Judgement: Decreased awareness of safety, Decreased awareness of deficits Awareness: Emergent Problem Solving: Slow processing General Comments: Pt with flat affect throughout, but he reports he can be like that in certain circumstances. Pt with poor awareness to pause when initially sitting up or standing before taking off to walk as he displayed some initial instability. Pt havign difficult time understanding and comparing his gait deviations to his PLOF        Exercises      General Comments        Pertinent Vitals/Pain Pain Assessment Pain Assessment: 0-10 Pain Score: 7  Pain Location: R shoulder, head Pain Descriptors / Indicators: Discomfort, Grimacing, Guarding, Sore Pain Intervention(s): Monitored during session, Limited activity within patient's tolerance, Premedicated before session, Repositioned, Patient requesting pain meds-RN notified    Home Living                          Prior Function            PT Goals (current goals can now be found in the care plan section) Acute Rehab PT Goals Patient Stated Goal: to feel better PT Goal Formulation: With patient Time For Goal Achievement: 02/09/22 Potential to Achieve Goals: Good Progress towards PT goals: Progressing toward goals    Frequency    Min 4X/week      PT Plan Current plan remains appropriate    Co-evaluation              AM-PAC PT "6 Clicks" Mobility   Outcome Measure  Help needed turning from your back to your side while in a flat bed without using bedrails?: A Little Help needed moving from  lying on your back to sitting on the side of a flat bed without using bedrails?: A Little Help needed moving to and from a bed to a chair (including a wheelchair)?: A Little Help needed standing up from a chair using your arms (e.g., wheelchair or bedside chair)?: A Little Help needed to walk in hospital room?: A Little Help needed climbing 3-5 steps with a railing? : A Little 6 Click Score: 18    End of Session   Activity Tolerance: Patient tolerated treatment well Patient left: in bed;with call bell/phone within reach;with bed alarm set Nurse Communication: Mobility status;Patient requests pain meds PT Visit Diagnosis: Unsteadiness on feet (R26.81);Other abnormalities of gait and mobility (R26.89);Difficulty in walking, not elsewhere classified (R26.2);Dizziness and giddiness (R42)     Time: 0867-6195 PT Time Calculation (min) (ACUTE ONLY): 16 min  Charges:  $Gait Training: 8-22 mins                     Raymond Gurney, PT, DPT Acute Rehabilitation Services  Office: [phone]    Jewel Baize 01/27/2022, 5:43 PM

## 2022-01-27 NOTE — Progress Notes (Signed)
Overall recently stable.  Remains blunted and somewhat lethargic.  Awakens easily follows commands.  Oriented and speech fluent.  Motor examination stable.  Still with right shoulder weakness secondary to right scapular fracture and possibly some degree of axillary nerve stretch injury.  Continue sling.  Patient status post fall with resultant traumatic brain injury.  Patient with a small left parietal subdural hematoma which appears stable on follow-up imaging.  We will continue efforts at mobilization.  Patient's home situation unclear if it is going to be something where he can thrive.

## 2022-01-28 LAB — BASIC METABOLIC PANEL
Anion gap: 13 (ref 5–15)
BUN: 5 mg/dL — ABNORMAL LOW (ref 6–20)
CO2: 20 mmol/L — ABNORMAL LOW (ref 22–32)
Calcium: 8.8 mg/dL — ABNORMAL LOW (ref 8.9–10.3)
Chloride: 96 mmol/L — ABNORMAL LOW (ref 98–111)
Creatinine, Ser: 0.79 mg/dL (ref 0.61–1.24)
GFR, Estimated: >60 mL/min (ref >60)
Glucose, Bld: 98 mg/dL (ref 70–99)
Potassium: 4.6 mmol/L (ref 3.5–5.1)
Sodium: 129 mmol/L — ABNORMAL LOW (ref 135–145)

## 2022-01-28 NOTE — TOC Progression Note (Signed)
Transition of Care [facility]) - Progression Note    Patient Details  Name: Eric Conley MRN: [id number] Date of Birth: 1961-05-25  Transition of Care [facility]) CM/SW Contact  Beckie Busing, RN Phone Number:[phone]  01/28/2022, 10:18 AM  Clinical Narrative:    TOC consulted for Substance Abuse Couseling/Education. Case manager at bedside for screening but patient denies substance or alcohol abuse.        Expected Discharge Plan and Services                                                 Social Determinants of Health (SDOH) Interventions    Readmission Risk Interventions     No data to display

## 2022-01-28 NOTE — TOC CAGE-AID Note (Signed)
Transition of Care [facility]) - CAGE-AID Screening   Patient Details  Name: Eric Conley MRN: [id number] Date of Birth: [birth year]  Transition of Care [facility]) CM/SW Contact:    Beckie Busing, RN Phone Number:[phone]  01/28/2022, 10:20 AM   Clinical Narrative: Patient refused CAGE aid stating that he has no history of substance or alcohol abuse.    CAGE-AID Screening: Substance Abuse Screening unable to be completed due to: : Patient Refused

## 2022-01-28 NOTE — Progress Notes (Signed)
Overall stable.  Patient remains somewhat lethargic and unmotivated.  He will participate in therapy but then generally gets back in bed and sleeps.  Still with right shoulder pain and weakness.  No change in that.  No other neurologic symptoms.  His vital signs are stable.  He is afebrile.  Overall progressing slowly.  Continue supportive efforts.  Patient's home situation not great for discharge.  Tentatively shooting for sometime this week and if he can get better observation from his brother confirmed.

## 2022-01-28 NOTE — Progress Notes (Signed)
Occupational Therapy Treatment Patient Details Name: Eric Conley MRN: [id number] DOB: [birth year] Today's Date: 01/28/2022   History of present illness Pt is a 60yo male admitted 01/24/22 after fall at work found down in break room. Pt with TBI with loculated L posterior pariental subdural hematoma with mass effect.  Pt also with subarachoid hemorrhage. Pt with  R scapular fx.  Ortho managing with no surgery and WBAT.  PMH: depression, ? alcohol abuse   OT comments  Patient continues to make incremental progress towards goals in skilled OT session. Patient's session encompassed  Patient unaware of any precautions with his R shoulder, told OT "dont strain it". Attempted pillbox test with patient, where he was able to read all of the pillboxes aloud, with OT giving the prompt "fill the pillbox as you would for one week" patient sitting and staring blankly at pillbox for over 2 minutes prior to attempting, where patient would take one "pill" from each of the containers and place on in each of the allotted weekly spots. When prompted by OT to read the directions on the pill container and place into the box, patient paused briefly, and then continued to go down the row placing one "pill" in each row. OT then asking patient to complete clock test (see image below). OT then drawing larger circle for patient and prompting patient to list out numbers. Patient then asked to set the time to ten past 11, with patient placing an 11 by the 12 (as evidenced by photo) and asking if he set the time correctly. Given current level of function, patient is not safe to go home independently without 24/7 supervision. Patient is at severe risk of polypharmacy, falls, and other accidents that would cause further hospital admissions and potential increased impairment.     Media Information   Document Information  Photos  Clock Test 01/28/22  01/28/2022 15:27  Attached To:  Hospital Encounter on 01/24/22   Source  Information  Ayris Carano, Malena Edman, OT  [facility]            Recommendations for follow up therapy are one component of a multi-disciplinary discharge planning process, led by the attending physician.  Recommendations may be updated based on patient status, additional functional criteria and insurance authorization.    Follow Up Recommendations  Outpatient OT    Assistance Recommended at Discharge Frequent or constant Supervision/Assistance (Patient needs 24/7 supervision)  Patient can return home with the following  A little help with walking and/or transfers;A little help with bathing/dressing/bathroom;Assistance with cooking/housework;Direct supervision/assist for financial management;Assist for transportation;Help with stairs or ramp for entrance;Direct supervision/assist for medications management   Equipment Recommendations  Other (comment) (will continue to assess)    Recommendations for Other Services      Precautions / Restrictions Precautions Precautions: Fall Restrictions Weight Bearing Restrictions: Yes RUE Weight Bearing: Weight bearing as tolerated       Mobility Bed Mobility Overal bed mobility: Needs Assistance Bed Mobility: Supine to Sit, Sit to Supine     Supine to sit: Supervision, HOB elevated Sit to supine: Supervision, HOB elevated        Transfers                         Balance Overall balance assessment: Needs assistance Sitting-balance support: Feet supported Sitting balance-Leahy Scale: Good  ADL either performed or assessed with clinical judgement   ADL Overall ADL's : Needs assistance/impaired                                     Functional mobility during ADLs: Minimal assistance General ADL Comments: Session focus on assessing cognition    Extremity/Trunk Assessment              Vision       Perception     Praxis       Cognition Arousal/Alertness: Awake/alert Behavior During Therapy: Flat affect Overall Cognitive Status: Impaired/Different from baseline Area of Impairment: Orientation, Attention, Memory, Following commands, Awareness, Problem solving                 Orientation Level: Situation Current Attention Level: Focused Memory: Decreased short-term memory, Decreased recall of precautions Following Commands: Follows one step commands with increased time, Follows multi-step commands with increased time Safety/Judgement: Decreased awareness of safety, Decreased awareness of deficits Awareness: Emergent Problem Solving: Slow processing, Decreased initiation, Difficulty sequencing, Requires verbal cues, Requires tactile cues General Comments: Patient unaware of any precautions with his R shoulder, told OT "dont strain it". Attempted pillbox test with patient, where he was able to read all of the pillboxes aloud, with OT giving the prompt "fill the pillbox as you would for one week" patient sitting and staring blankly at pillbox for over 2 minutes prior to attempting, where patient would take one "pill" from each of the containers and place on in each of the allotted weekly spots. When prompted by OT to read the directions on the pill container and place into the box, patient paused briefly, and then continued to go down the row placing one "pill" in each row. OT then asking patient to complete clock test (see image below). OT then drawing larger circle for patient and prompting patient to list out numbers. Patient then asked to set the time to ten past 11, with patient placing an 11 by the 12 (as evidenced by photo) and asking if he set the time correctly.        Exercises      Shoulder Instructions       General Comments      Pertinent Vitals/ Pain       Pain Assessment Pain Assessment: Faces Faces Pain Scale: Hurts a little bit Pain Location: R shoulder Pain Descriptors / Indicators:  Discomfort, Grimacing, Guarding, Sore Pain Intervention(s): Limited activity within patient's tolerance, Repositioned, Monitored during session  Home Living                                          Prior Functioning/Environment              Frequency  Min 3X/week        Progress Toward Goals  OT Goals(current goals can now be found in the care plan section)  Progress towards OT goals: Progressing toward goals  Acute Rehab OT Goals Patient Stated Goal: none stated OT Goal Formulation: Patient unable to participate in goal setting Time For Goal Achievement: 02/08/22 Potential to Achieve Goals: Good  Plan Discharge plan remains appropriate    Co-evaluation                 AM-PAC OT "6 Clicks" Daily Activity  Outcome Measure   Help from another person eating meals?: None Help from another person taking care of personal grooming?: None Help from another person toileting, which includes using toliet, bedpan, or urinal?: A Little Help from another person bathing (including washing, rinsing, drying)?: A Little Help from another person to put on and taking off regular upper body clothing?: A Little Help from another person to put on and taking off regular lower body clothing?: A Little 6 Click Score: 20    End of Session Equipment Utilized During Treatment: Other (comment) (Pillbox)  OT Visit Diagnosis: Unsteadiness on feet (R26.81)   Activity Tolerance Patient tolerated treatment well   Patient Left in bed;with call bell/phone within reach;with bed alarm set   Nurse Communication Mobility status        Time: 9371-6967 OT Time Calculation (min): 18 min  Charges: OT General Charges $OT Visit: 1 Visit OT Treatments $Self Care/Home Management : 8-22 mins  Pollyann Glen E. Zyann Mabry, OTR/L Acute Rehabilitation Services [phone]   Cherlyn Cushing 01/28/2022, 3:12 PM

## 2022-01-29 NOTE — Progress Notes (Signed)
Subjective: Patient reports doing well. Right shoulder pain and weakness persists. He has no other complaints. NAE ON.  Objective: Vital signs in last 24 hours: Temp:  [97.6 F (36.4 C)-98.9 F (37.2 C)] 98.9 F (37.2 C) (08/26 0750) Pulse Rate:  [52-79] 79 (08/26 0750) Resp:  [13-18] 16 (08/26 0750) BP: (128-157)/(60-77) 147/77 (08/26 0750) SpO2:  [93 %-96 %] 96 % (08/26 0750)  Intake/Output from previous day: No intake/output data recorded. Intake/Output this shift: No intake/output data recorded.  Physical Exam: Patient is awake, A/O X 4. Eyes open spontaneously. They are in NAD and VSS. Doing well. Speech is fluent and appropriate. MAEW. Decreased ROM in right shoulder secondary to pain. Sensation to light touch is intact. PERLA, EOMI. CNs grossly intact.    Lab Results: No results for input(s): "WBC", "HGB", "HCT", "PLT" in the last 72 hours. BMET Recent Labs    01/27/22 0209 01/28/22 0223  NA 135 129*  K 3.6 4.6  CL 103 96*  CO2 24 20*  GLUCOSE 100* 98  BUN <5* 5*  CREATININE 0.63 0.79  CALCIUM 8.2* 8.8*    Studies/Results: No results found.  Assessment/Plan: 61 y.o. male with significant TBI with left frontal contusion with some overlying subarachnoid hemorrhage, and a loculated left posterior parietal subdural hematoma with mild mass effect. His neurological exam is stable. Right shoulder pain and weakness persists. No new NSX recommendations at this time. Continue supportive care.     LOS: 5 days     Council Mechanic, DNP, AGNP-C Neurosurgery Nurse Practitioner  [facility] [address], [address], [place], [place] [address] P: [phone]    F: [phone]  01/29/2022 10:39 AM

## 2022-01-30 NOTE — Progress Notes (Signed)
Subjective: Patient reports intermittent migraine type headaches. He has no other complaints. NAE ON.   Objective: Vital signs in last 24 hours: Temp:  [98 F (36.7 C)-99.4 F (37.4 C)] 98 F (36.7 C) (08/27 0400) Pulse Rate:  [5-96] 78 (08/27 0400) Resp:  [14-18] 18 (08/27 0400) BP: (120-147)/(74-84) 120/84 (08/27 0400) SpO2:  [95 %-98 %] 96 % (08/27 0400)  Intake/Output from previous day: 08/26 0701 - 08/27 0700 In: -  Out: 500 [Urine:500] Intake/Output this shift: Total I/O In: -  Out: 500 [Urine:500]  Physical Exam: Patient is awake, A/O X 4. Eyes open spontaneously. NAD. VSS. Speech is fluent and appropriate. MAEW. Decreased ROM and strength in right shoulder secondary to pain. Sensation to light touch is intact. PERLA, EOMI. CNs grossly intact.     Lab Results: No results for input(s): "WBC", "HGB", "HCT", "PLT" in the last 72 hours. BMET Recent Labs    01/28/22 0223  NA 129*  K 4.6  CL 96*  CO2 20*  GLUCOSE 98  BUN 5*  CREATININE 0.79  CALCIUM 8.8*    Studies/Results: No results found.  Assessment/Plan: 61 y.o. male with significant TBI with left frontal contusion with some overlying subarachnoid hemorrhage and a loculated left posterior parietal SDH with mild mass effect. His neurological exam is stable. Right shoulder pain and weakness persists. Intermittent headaches. No new NSX recommendations. Will hopefully be ready to discharge over the next few days once is home situation is sorted out. Continue supportive care.    LOS: 6 days     Council Mechanic, DNP, AGNP-C Neurosurgery Nurse Practitioner  [facility] [address], [address], [place], [place] [address] P: [phone]    F: [phone]  01/30/2022 6:34 AM

## 2022-01-30 NOTE — Progress Notes (Signed)
Mobility Specialist Progress Note   01/30/22 1132  Mobility  Activity Ambulated with assistance in hallway  Level of Assistance Contact guard assist, steadying assist  Assistive Device None  RUE Weight Bearing WBAT  Distance Ambulated (ft) 600 ft  Activity Response Tolerated well  $Mobility charge 1 Mobility   Pre Mobility: 108/76 BP Post Mobility: 135/99 BP  Received pt in bed having (8/10) pain in R shoulder w/ a slight HA but agreeable to mobility. Pt requiring no physical assistance but CG for safety d/t antalgic like gait and limited clearing of footsteps. Returned back to room w/o fault but pt slightly frustrated w/ not being able to finish there sentences d/t "mental fog". Left call bell by side, bed alarm on and notified RN.   Frederico Hamman Mobility Specialist MS [facility] #:  [phone] Acute Rehab Office:  [phone]

## 2022-01-31 LAB — GLUCOSE, CAPILLARY
Glucose-Capillary: 111 mg/dL — ABNORMAL HIGH (ref 70–99)
Glucose-Capillary: 200 mg/dL — ABNORMAL HIGH (ref 70–99)

## 2022-01-31 NOTE — Progress Notes (Signed)
   Providing Compassionate, Quality Care - Together   Subjective: Patient reports right shoulder pain and headache. Feels like the current medication regimen does help.  Objective: Vital signs in last 24 hours: Temp:  [97.7 F (36.5 C)-98.8 F (37.1 C)] 97.8 F (36.6 C) (08/28 1109) Pulse Rate:  [92-109] 92 (08/28 1109) Resp:  [16-20] 20 (08/28 1109) BP: (90-124)/(64-86) 119/67 (08/28 1109) SpO2:  [95 %-98 %] 98 % (08/28 1109)  Intake/Output from previous day: 08/27 0701 - 08/28 0700 In: -  Out: 1000 [Urine:1000] Intake/Output this shift: No intake/output data recorded.  Alert and oriented x 4, flat affect PERRLA Speech clear CN II-XII grossly intact MAE, Strength and sensation intact aside from right shoulder weakness  Lab Results: No results for input(s): "WBC", "HGB", "HCT", "PLT" in the last 72 hours. BMET No results for input(s): "NA", "K", "CL", "CO2", "GLUCOSE", "BUN", "CREATININE", "CALCIUM" in the last 72 hours.  Studies/Results: No results found.  Assessment/Plan: Patient had a fall from ground level where he sustained a left frontal contusion and left posterior parietal subdural hematoma. He also suffered a right scapular fracture. This is being managed conservatively. Patient's brother and sister-in-law are at the bedside. They feel comfortable with the patient discharging home with them. They do report the patient is a heavy drinker. A discussion was had with the patient and his family about the importance of avoiding alcohol once discharged.   LOS: 7 days   -Plan is for discharge home tomorrow to brother's home. -Outpatient PT and OT at discharge.   Val Eagle, DNP, AGNP-C Nurse Practitioner  [facility] [address], [address], [place], [place] [address] P: [phone]    F: [phone]  01/31/2022, 11:14 AM

## 2022-01-31 NOTE — Progress Notes (Signed)
Physical Therapy Treatment Patient Details Name: Eric Conley MRN: [id number] DOB: April 06, 1961 Today's Date: 01/31/2022   History of Present Illness Pt is a 60yo male admitted 01/24/22 after fall at work found down in break room. Pt with TBI with loculated L posterior pariental subdural hematoma with mass effect.  Pt also with subarachoid hemorrhage. Pt with  R scapular fx.  Ortho managing with no surgery and WBAT.  PMH: depression, ? alcohol abuse    PT Comments    Patient progressing slowly towards PT goals. Session focused on higher level balance challenges, stair training and dual tasking. Requires supervision-Min guard for safety with mobility. Requires min A for stair training due to impulsivity and weakness.  Some difficulty noted with cognitive tasks during ambulation resulting in gait deviations but no overt LOB. Scored 17/24 on DGI indicating fall risk. Continues to exhibit deficits relating to attention, problem solving, following multi step commands, safety and awareness. Plans to d/c home with support of family. Will follow.   Recommendations for follow up therapy are one component of a multi-disciplinary discharge planning process, led by the attending physician.  Recommendations may be updated based on patient status, additional functional criteria and insurance authorization.  Follow Up Recommendations  Outpatient PT (neuro)     Assistance Recommended at Discharge Frequent or constant Supervision/Assistance  Patient can return home with the following A little help with walking and/or transfers;A little help with bathing/dressing/bathroom;Assistance with cooking/housework;Direct supervision/assist for medications management;Direct supervision/assist for financial management;Assist for transportation;Help with stairs or ramp for entrance   Equipment Recommendations  None recommended by PT    Recommendations for Other Services       Precautions / Restrictions  Precautions Precautions: Fall Restrictions Weight Bearing Restrictions: Yes RUE Weight Bearing: Weight bearing as tolerated     Mobility  Bed Mobility Overal bed mobility: Needs Assistance Bed Mobility: Supine to Sit, Sit to Supine     Supine to sit: Supervision, HOB elevated Sit to supine: Supervision, HOB elevated   General bed mobility comments: Supervision for safety    Transfers Overall transfer level: Needs assistance Equipment used: None Transfers: Sit to/from Stand Sit to Stand: Supervision           General transfer comment: Supervision for safety. Mild instability noted initially. Stood from [employer], from toilet x1. Reminded pt to gain balance before walking.    Ambulation/Gait Ambulation/Gait assistance: Min guard Gait Distance (Feet): 500 Feet Assistive device: None Gait Pattern/deviations: Step-through pattern, Decreased stride length, Wide base of support, Antalgic, Decreased step length - right, Shuffle, Decreased stance time - left Gait velocity: reduced Gait velocity interpretation: 1.31 - 2.62 ft/sec, indicative of limited community ambulator   General Gait Details: Slow, shuffling like gait with decreased foot clearance bilaterally and wide BoS, drifting noted in both directions. Difficulty with head turns and turning around needing close min guard due to staggering.   Stairs Stairs: Yes Stairs assistance: Min guard, Min assist Stair Management: Alternating pattern, One rail Left Number of Stairs: 13 General stair comments: Cues for step to pattern but pt utilizing step through pattern and needing Min A for balance to ascend stairs.   Wheelchair Mobility    Modified Rankin (Stroke Patients Only) Modified Rankin (Stroke Patients Only) Pre-Morbid Rankin Score: No symptoms Modified Rankin: Moderately severe disability     Balance Overall balance assessment: Needs assistance Sitting-balance support: Feet supported, No upper extremity  supported Sitting balance-Leahy Scale: Good     Standing balance support: During functional activity,  No upper extremity supported Standing balance-Leahy Scale: Fair Standing balance comment: Able to stand at sink and wash hands without difficulty, tripped over pad on floor but able to catch self.                            Cognition Arousal/Alertness: Awake/alert Behavior During Therapy: Flat affect Overall Cognitive Status: Impaired/Different from baseline Area of Impairment: Attention, Memory, Following commands, Safety/judgement, Awareness, Problem solving                   Current Attention Level: Sustained Memory: Decreased short-term memory, Decreased recall of precautions Following Commands: Follows one step commands with increased time, Follows multi-step commands inconsistently Safety/Judgement: Decreased awareness of safety, Decreased awareness of deficits Awareness: Emergent Problem Solving: Slow processing, Requires verbal cues General Comments: Requires repetition to follow multi step commands, poor attention. Able to dual task with increased time and mild deviation in gait. Difficulty with counting down by 3s from 20, easily distracted        Exercises      General Comments        Pertinent Vitals/Pain Pain Assessment Pain Assessment: No/denies pain    Home Living                          Prior Function            PT Goals (current goals can now be found in the care plan section) Progress towards PT goals: Progressing toward goals    Frequency    Min 4X/week      PT Plan Current plan remains appropriate    Co-evaluation              AM-PAC PT "6 Clicks" Mobility   Outcome Measure  Help needed turning from your back to your side while in a flat bed without using bedrails?: A Little Help needed moving from lying on your back to sitting on the side of a flat bed without using bedrails?: A Little Help needed  moving to and from a bed to a chair (including a wheelchair)?: A Little Help needed standing up from a chair using your arms (e.g., wheelchair or bedside chair)?: A Little Help needed to walk in hospital room?: A Little Help needed climbing 3-5 steps with a railing? : A Little 6 Click Score: 18    End of Session Equipment Utilized During Treatment: Gait belt Activity Tolerance: Patient tolerated treatment well Patient left: in bed;with call bell/phone within reach;with bed alarm set Nurse Communication: Mobility status PT Visit Diagnosis: Unsteadiness on feet (R26.81);Other abnormalities of gait and mobility (R26.89);Difficulty in walking, not elsewhere classified (R26.2);Dizziness and giddiness (R42)     Time: 0017-4944 PT Time Calculation (min) (ACUTE ONLY): 20 min  Charges:  $Gait Training: 8-22 mins                     Vale Haven, PT, DPT Acute Rehabilitation Services Secure chat preferred Office [phone]      Blake Divine A Lanier Ensign 01/31/2022, 2:52 PM

## 2022-01-31 NOTE — Progress Notes (Signed)
Mobility Specialist Progress Note   01/31/22 1805  Mobility  Activity Ambulated with assistance in hallway  Level of Assistance Contact guard assist, steadying assist  Assistive Device None  RUE Weight Bearing WBAT  Distance Ambulated (ft) 400 ft  Activity Response Tolerated well  $Mobility charge 1 Mobility   Received pt in bed having no complaints and agreeable. Upon exiting room pt unaware of certain obstacles and having moment of LOB. Once in hallway no faults in ambulation and pt able to make it back to room w/o incident. Left w/ call bell in reach and bed alarm on.   Frederico Hamman Mobility Specialist MS [facility] #:  [phone] Acute Rehab Office:  [phone]

## 2022-01-31 NOTE — Progress Notes (Signed)
Occupational Therapy Treatment Patient Details Name: Eric Conley MRN: [id number] DOB: 1960-06-24 Today's Date: 01/31/2022   History of present illness Pt is a 60yo male admitted 01/24/22 after fall at work found down in break room. Pt with TBI with loculated L posterior pariental subdural hematoma with mass effect.  Pt also with subarachoid hemorrhage. Pt with  R scapular fx.  Ortho managing with no surgery and WBAT.  PMH: depression, ? alcohol abuse   OT comments  Patient continues to make steady progress towards goals in skilled OT session. Patient's session encompassed further assessment of cognition. Patient completing task at the vending machine with OT to date, patient initially forgetting his wallet and having to return back to the room, then requiring assist to manipulate machine (not able to coordinate buttons fast enough). Patient initially able to find his way back to his room without assist from OT, (and able to verbalize room number with each prompting) however patient then nearly walking into ICU and bypassing his room requiring OT intervention to redirect. Patient did demonstrate increased awareness x1 in session, looking at OT and stating "why is it so easy for you". OT providing education to patient with regard to awareness and use of visual aides and strategies to promote increased independence once he returns home with family. OT will continue to follow acutely, endorsing 24/7 assist for patient at discharge.     Recommendations for follow up therapy are one component of a multi-disciplinary discharge planning process, led by the attending physician.  Recommendations may be updated based on patient status, additional functional criteria and insurance authorization.    Follow Up Recommendations  Outpatient OT    Assistance Recommended at Discharge Frequent or constant Supervision/Assistance (Patient needs 24/7 supervision)  Patient can return home with the following  A little  help with walking and/or transfers;A little help with bathing/dressing/bathroom;Assistance with cooking/housework;Direct supervision/assist for financial management;Assist for transportation;Help with stairs or ramp for entrance;Direct supervision/assist for medications management   Equipment Recommendations  None recommended by OT    Recommendations for Other Services      Precautions / Restrictions Precautions Precautions: Fall Restrictions Weight Bearing Restrictions: Yes RUE Weight Bearing: Weight bearing as tolerated       Mobility Bed Mobility Overal bed mobility: Needs Assistance Bed Mobility: Supine to Sit, Sit to Supine     Supine to sit: Supervision, HOB elevated Sit to supine: Supervision, HOB elevated   General bed mobility comments: Supervision for safety    Transfers Overall transfer level: Needs assistance Equipment used: None Transfers: Sit to/from Stand Sit to Stand: Supervision                 Balance Overall balance assessment: Needs assistance Sitting-balance support: Feet supported, No upper extremity supported Sitting balance-Leahy Scale: Good     Standing balance support: During functional activity, No upper extremity supported Standing balance-Leahy Scale: Fair Standing balance comment: Would occaisionally reach out for surfaces when ambulating                           ADL either performed or assessed with clinical judgement   ADL Overall ADL's : Needs assistance/impaired                         Toilet Transfer: Min guard;Ambulation           Functional mobility during ADLs: Min guard General ADL Comments: Session focus on assessing  cognition    Extremity/Trunk Assessment              Vision       Perception     Praxis      Cognition Arousal/Alertness: Awake/alert Behavior During Therapy: Flat affect Overall Cognitive Status: Impaired/Different from baseline Area of Impairment: Attention,  Memory, Following commands, Safety/judgement, Awareness, Problem solving                 Orientation Level: Time Current Attention Level: Sustained Memory: Decreased short-term memory, Decreased recall of precautions Following Commands: Follows one step commands with increased time, Follows multi-step commands inconsistently Safety/Judgement: Decreased awareness of safety, Decreased awareness of deficits Awareness: Emergent Problem Solving: Slow processing, Requires verbal cues, Difficulty sequencing General Comments: Patient completing task at the vending machine with OT to date, patient initially forgetting his wallet and having to return back to the room, then requiring assist to manipulate machine (not able to coordinate buttons fast enough). Patient initially able to find his way back to his room without assist from OT, (and able to verbalize room number with each prompting) however patient then nearly walking into ICU and bypassing his room requiring OT intervention to redirect.        Exercises      Shoulder Instructions       General Comments      Pertinent Vitals/ Pain       Pain Assessment Pain Assessment: Faces Faces Pain Scale: Hurts a little bit Pain Location: R shoulder Pain Descriptors / Indicators: Discomfort, Grimacing, Guarding, Sore Pain Intervention(s): Limited activity within patient's tolerance, Monitored during session, Repositioned  Home Living                                          Prior Functioning/Environment              Frequency  Min 3X/week        Progress Toward Goals  OT Goals(current goals can now be found in the care plan section)  Progress towards OT goals: Progressing toward goals  Acute Rehab OT Goals Patient Stated Goal: none stated OT Goal Formulation: Patient unable to participate in goal setting Time For Goal Achievement: 02/08/22 Potential to Achieve Goals: Good  Plan Discharge plan remains  appropriate    Co-evaluation                 AM-PAC OT "6 Clicks" Daily Activity     Outcome Measure   Help from another person eating meals?: None Help from another person taking care of personal grooming?: None Help from another person toileting, which includes using toliet, bedpan, or urinal?: A Little Help from another person bathing (including washing, rinsing, drying)?: A Little Help from another person to put on and taking off regular upper body clothing?: None Help from another person to put on and taking off regular lower body clothing?: None 6 Click Score: 22    End of Session    OT Visit Diagnosis: Unsteadiness on feet (R26.81)   Activity Tolerance Patient tolerated treatment well   Patient Left in bed;with call bell/phone within reach;with bed alarm set   Nurse Communication Mobility status        Time: 3976-7341 OT Time Calculation (min): 13 min  Charges: OT General Charges $OT Visit: 1 Visit OT Treatments $Self Care/Home Management : 8-22 mins  Pollyann Glen E. Jermya Dowding, OTR/L  Acute Rehabilitation Services [phone]   Cherlyn Cushing 01/31/2022, 3:37 PM

## 2022-02-01 MED ORDER — DOCUSATE SODIUM 100 MG PO CAPS: 100.0000 mg | ORAL_CAPSULE | Freq: Two times a day (BID) | ORAL | 0 refills | Status: DC

## 2022-02-01 MED ORDER — HYDROCODONE-ACETAMINOPHEN 5-325 MG PO TABS: 1.0000 | ORAL_TABLET | ORAL | 0 refills | Status: DC | PRN

## 2022-02-01 NOTE — Discharge Summary (Signed)
Physician Discharge Summary     Providing Compassionate, Quality Care - Together   Patient ID: Eric Conley MRN: [id number] DOB/AGE: June 28, 1960 61 y.o.  Admit date: 01/24/2022 Discharge date: 02/01/2022  Admission Diagnoses: SDH  Discharge Diagnoses:  Principal Problem:   Epidural hematoma (HCC) Active Problems:   SDH (subdural hematoma) (HCC)   Discharged Condition: good  Hospital Course: Patient had a fall from ground level where he sustained a left frontal contusion and left posterior parietal subdural hematoma. He was brought to the [facility] ED via ambulance. He also suffered a right scapular fracture. This is being managed conservatively. Therapies have recommended outpatient follow-up and feel the patient is safe to discharge home with family.The patient's brother and sister-in-law feel comfortable with the patient discharging home with them.  The patient has been advised not to drive until he is seen in follow-up.  Consults: rehabilitation medicine  Significant Diagnostic Studies: radiology: CT HEAD WO CONTRAST  Result Date: 01/25/2022 CLINICAL DATA:  Follow-up subdural hematoma EXAM: CT HEAD WITHOUT CONTRAST TECHNIQUE: Contiguous axial images were obtained from the base of the skull through the vertex without intravenous contrast. RADIATION DOSE REDUCTION: This exam was performed according to the departmental dose-optimization program which includes automated exposure control, adjustment of the mA and/or kV according to patient size and/or use of iterative reconstruction technique. COMPARISON:  Yesterday FINDINGS: Brain: Extra-axial hemorrhage at the left parietal convexity measuring up to 19 mm in thickness with local cortical mass effect, unchanged. Hemorrhagic contusion in the anterior left frontal and temporal lobe without progression. Trace subarachnoid hemorrhage along the contusions and right parasagittal frontal region. No midline shift or hydrocephalus. Vascular: No  hyperdense vessel or unexpected calcification. Skull: Scalp swelling without fracture. Sinuses/Orbits: Negative IMPRESSION: No progression of extra-axial hemorrhage and left frontotemporal contusions. Electronically Signed   By: Tiburcio Pea M.D.   On: 01/25/2022 05:45   CT CHEST ABDOMEN PELVIS W CONTRAST  Result Date: 01/24/2022 CLINICAL DATA:  Fall with scapular fracture. EXAM: CT CHEST, ABDOMEN, AND PELVIS WITH CONTRAST TECHNIQUE: Multidetector CT imaging of the chest, abdomen and pelvis was performed following the standard protocol during bolus administration of intravenous contrast. RADIATION DOSE REDUCTION: This exam was performed according to the departmental dose-optimization program which includes automated exposure control, adjustment of the mA and/or kV according to patient size and/or use of iterative reconstruction technique. CONTRAST:  OMNIPAQUE IOHEXOL 300 MG/ML  SOLN COMPARISON:  Chest x-ray which was acquired on the same date along with neuro axis imaging. FINDINGS: CT CHEST FINDINGS Cardiovascular: Calcified aortic atherosclerosis. Normal heart size without pericardial effusion. Normal caliber of central pulmonary vasculature. No mediastinal hematoma. Smooth contour of the thoracic aorta. Mediastinum/Nodes: No acute process or signs of adenopathy in the mediastinum. Lungs/Pleura: No pneumothorax. Basilar atelectasis. Mild pulmonary emphysema that is worse towards the lung apices. Airways are patent. Musculoskeletal: Please see below for full musculoskeletal details. Comminuted fracture of the RIGHT inferior body of the scapula with surrounding intramuscular hematoma. RIGHT shoulder is located. RIGHT clavicle is intact. Subacute fractures of RIGHT and LEFT-sided ribs, LEFT anterior and lateral eighth rib and RIGHT lateral eighth rib. Sternum is intact. CT ABDOMEN PELVIS FINDINGS Hepatobiliary: Smooth hepatic contours. No signs of suspicious hepatic lesion or hepatic trauma. No  pericholecystic stranding. No biliary duct dilation. Pancreas: Normal, without mass, inflammation or ductal dilatation. Spleen: Normal. Adrenals/Urinary Tract: Adrenal glands are normal. Smooth renal contours. No suspicious renal lesion. No hydronephrosis. Stomach/Bowel: Suspect gastric rugal thickening which is diffuse. Fold thickening may  been present on previous imaging where there was better gastric distension. The stomach is under distended today which makes findings nonspecific. No acute small bowel process. Fluid-filled loops of distal small bowel with mild distension. Liquid stool in the colon proximally with better formed stool in the distal colon. No stranding adjacent to the colon. Normal appendix. Vascular/Lymphatic: Aortic atherosclerosis. No sign of aneurysm. Smooth contour of the IVC. There is no gastrohepatic or hepatoduodenal ligament lymphadenopathy. No retroperitoneal or mesenteric lymphadenopathy. No pelvic sidewall lymphadenopathy. Reproductive: Unremarkable to the extent evaluated on CT. Other: No visible body wall contusion. No signs of ascites or hemoperitoneum. Musculoskeletal: Scapular fracture on the RIGHT comminution and displacement. Rib fractures without displacement as discussed. Sternum intact. Pelvis without signs of fracture with sacro iliac joint symmetry and normal appearance of symphysis pubis. IMPRESSION: 1. Highly comminuted fracture of the RIGHT inferior body of the scapula with surrounding intramuscular hematoma. 2. Subacute fractures of bilateral inferior and lateral ribs. 3. Question of gastric rugal thickening which is diffuse. Fold thickening may been present on previous imaging where there was better gastric distension. Correlate with any symptoms of gastritis or anemia, given persistence of this finding in hindsight over a series of prior studies would consider follow-up endoscopy for further assessment on a nonemergent basis. 4. Emphysema and aortic atherosclerosis.  Aortic Atherosclerosis (ICD10-I70.0) and Emphysema (ICD10-J43.9). Electronically Signed   By: Donzetta Kohut M.D.   On: 01/24/2022 11:34   DG Humerus Right  Result Date: 01/24/2022 CLINICAL DATA:  Right humeral abnormality on radiograph EXAM: RIGHT HUMERUS - 2+ VIEW COMPARISON:  None Available. FINDINGS: There is a displaced osseous fragment about the lateral aspect of the scapula, most consistent with a scapular fracture. No appreciable fracture of the right humerus. IMPRESSION: 1. Displaced osseous fragment about the lateral aspect of the scapula with irregularity of the lateral border of the scapula which is partially imaged, most consistent with a scapular fracture. 2. No appreciable fracture of the right humerus. Electronically Signed   By: Larose Hires D.O.   On: 01/24/2022 10:07   CT Head Wo Contrast  Result Date: 01/24/2022 CLINICAL DATA:  Dizziness, RUE weakness; Neck trauma, focal neuro deficit or paresthesia (Age 59-64y). Syncopal episode 2 days ago. EXAM: CT HEAD WITHOUT CONTRAST CT CERVICAL SPINE WITHOUT CONTRAST TECHNIQUE: Multidetector CT imaging of the head and cervical spine was performed following the standard protocol without intravenous contrast. Multiplanar CT image reconstructions of the cervical spine were also generated. RADIATION DOSE REDUCTION: This exam was performed according to the departmental dose-optimization program which includes automated exposure control, adjustment of the mA and/or kV according to patient size and/or use of iterative reconstruction technique. COMPARISON:  CT head and cervical spine 07/26/2021 FINDINGS: CT HEAD FINDINGS Brain: A biconvex, hyperdense extra-axial hematoma over the left parietal convexity measures 1.9 cm in thickness and may be subdural or epidural. This results in mild regional mass effect without midline shift. A thin subdural hematoma extends more anteriorly along the left lateral cerebral convexity and measures up to 3 mm in thickness.  Small regions of edema anteriorly in the left frontal and left temporal lobes are most compatible with contusions, and there is a small amount of associated subarachnoid and possibly parenchymal hemorrhage in these regions. The ventricles are normal in size. Vascular: Calcified atherosclerosis at the skull base. No hyperdense vessel. Skull: No acute fracture or suspicious osseous lesion. Sinuses/Orbits: Mild mucosal thickening in the included paranasal sinuses. Clear mastoid air cells. Unremarkable orbits. Other: Mild posterior scalp  swelling. CT CERVICAL SPINE FINDINGS Alignment: Mild right convex curvature of the cervical spine. No traumatic subluxation. Skull base and vertebrae: No acute fracture or suspicious osseous lesion. Soft tissues and spinal canal: No prevertebral fluid or swelling. No visible canal hematoma. Disc levels: Moderate disc degeneration in the mid to lower cervical spine and mild facet arthrosis. Mild-to-moderate right neural foraminal stenosis at C6-7 due to uncovertebral spurring. Upper chest: Mild centrilobular and paraseptal emphysema. Other: None. IMPRESSION: 1. Extra-axial hematoma over the left cerebral convexity as above. Mild mass effect without midline shift. 2. Left frontal and temporal lobe contusions with small amount of associated subarachnoid and possibly parenchymal hemorrhage. 3. No skull fracture or cervical spine fracture. 4. Emphysema (ICD10-J43.9). Critical Value/emergent results were called by telephone at the time of interpretation on 01/24/2022 at 9:32 am to Dr. Vonita Mossobert Paterson, who verbally acknowledged these results. Electronically Signed   By: Sebastian AcheAllen  Grady M.D.   On: 01/24/2022 09:35   CT Cervical Spine Wo Contrast  Result Date: 01/24/2022 CLINICAL DATA:  Dizziness, RUE weakness; Neck trauma, focal neuro deficit or paresthesia (Age 61-64y). Syncopal episode 2 days ago. EXAM: CT HEAD WITHOUT CONTRAST CT CERVICAL SPINE WITHOUT CONTRAST TECHNIQUE: Multidetector CT  imaging of the head and cervical spine was performed following the standard protocol without intravenous contrast. Multiplanar CT image reconstructions of the cervical spine were also generated. RADIATION DOSE REDUCTION: This exam was performed according to the departmental dose-optimization program which includes automated exposure control, adjustment of the mA and/or kV according to patient size and/or use of iterative reconstruction technique. COMPARISON:  CT head and cervical spine 07/26/2021 FINDINGS: CT HEAD FINDINGS Brain: A biconvex, hyperdense extra-axial hematoma over the left parietal convexity measures 1.9 cm in thickness and may be subdural or epidural. This results in mild regional mass effect without midline shift. A thin subdural hematoma extends more anteriorly along the left lateral cerebral convexity and measures up to 3 mm in thickness. Small regions of edema anteriorly in the left frontal and left temporal lobes are most compatible with contusions, and there is a small amount of associated subarachnoid and possibly parenchymal hemorrhage in these regions. The ventricles are normal in size. Vascular: Calcified atherosclerosis at the skull base. No hyperdense vessel. Skull: No acute fracture or suspicious osseous lesion. Sinuses/Orbits: Mild mucosal thickening in the included paranasal sinuses. Clear mastoid air cells. Unremarkable orbits. Other: Mild posterior scalp swelling. CT CERVICAL SPINE FINDINGS Alignment: Mild right convex curvature of the cervical spine. No traumatic subluxation. Skull base and vertebrae: No acute fracture or suspicious osseous lesion. Soft tissues and spinal canal: No prevertebral fluid or swelling. No visible canal hematoma. Disc levels: Moderate disc degeneration in the mid to lower cervical spine and mild facet arthrosis. Mild-to-moderate right neural foraminal stenosis at C6-7 due to uncovertebral spurring. Upper chest: Mild centrilobular and paraseptal emphysema.  Other: None. IMPRESSION: 1. Extra-axial hematoma over the left cerebral convexity as above. Mild mass effect without midline shift. 2. Left frontal and temporal lobe contusions with small amount of associated subarachnoid and possibly parenchymal hemorrhage. 3. No skull fracture or cervical spine fracture. 4. Emphysema (ICD10-J43.9). Critical Value/emergent results were called by telephone at the time of interpretation on 01/24/2022 at 9:32 am to Dr. Vonita Mossobert Paterson, who verbally acknowledged these results. Electronically Signed   By: Sebastian AcheAllen  Grady M.D.   On: 01/24/2022 09:35   DG Shoulder Right  Result Date: 01/24/2022 CLINICAL DATA:  Larey SeatFell 2 days ago with shoulder pain EXAM: RIGHT SHOULDER - 2+ VIEW  COMPARISON:  None FINDINGS: On the frontal projection, the glenohumeral joint appears normal. The AC joint appears normal. No fracture is seen. On the transscapular Y-view projection, there is suggestion of a segmental cortical fracture of the proximal humeral diaphysis. This would be quite unusual and there is no sign of this on the frontal projection. It could relate to overlying artifact. Particularly if the patient has significant proximal humeral pain, I would suggest repeating the examination versus consider CT. IMPRESSION: See above discussion regarding the possibility of an unusual segmental cortical fracture of the proximal humerus. Electronically Signed   By: Paulina Fusi M.D.   On: 01/24/2022 09:28   DG Chest 2 View  Result Date: 01/24/2022 CLINICAL DATA:  dizziness, confusion, right arm pain, syncope EXAM: CHEST - 2 VIEW COMPARISON:  Chest x-ray 12/30/2021. FINDINGS: The heart size and mediastinal contours are within normal limits. Both lungs are clear. No visible pleural effusions or pneumothorax. No acute osseous abnormality. IMPRESSION: No evidence of acute cardiopulmonary disease. Electronically Signed   By: Feliberto Harts M.D.   On: 01/24/2022 09:22     Treatment: Therapies  Discharge  Exam: Blood pressure (!) 150/80, pulse 79, temperature 98.2 F (36.8 C), temperature source Oral, resp. rate 16, height 5\' 9"  (1.753 m), weight 71.6 kg, SpO2 97 %.  Alert and oriented x 4, flat affect PERRLA Speech clear CN II-XII grossly intact MAE, Strength and sensation intact aside from right shoulder weakness    Disposition: Discharge disposition: 01-Home or Self Care       Discharge Instructions     Ambulatory referral to Occupational Therapy   Complete by: As directed    Ambulatory referral to Physical Therapy   Complete by: As directed    Ambulatory referral to Speech Therapy   Complete by: As directed    Call MD for:  difficulty breathing, headache or visual disturbances   Complete by: As directed    Call MD for:  persistant nausea and vomiting   Complete by: As directed    Call MD for:  redness, tenderness, or signs of infection (pain, swelling, redness, odor or green/yellow discharge around incision site)   Complete by: As directed    Call MD for:  severe uncontrolled pain   Complete by: As directed    Call MD for:  temperature >100.4   Complete by: As directed    Diet - low sodium heart healthy   Complete by: As directed    Increase activity slowly   Complete by: As directed       Allergies as of 02/01/2022       Reactions   Codeine    Poison Sumac Extract         Medication List     TAKE these medications    diazepam 10 MG tablet Commonly known as: VALIUM Take 10 mg by mouth 2 (two) times daily.   docusate sodium 100 MG capsule Commonly known as: COLACE Take 1 capsule (100 mg total) by mouth 2 (two) times daily.   HYDROcodone-acetaminophen 5-325 MG tablet Commonly known as: NORCO/VICODIN Take 1-2 tablets by mouth every 4 (four) hours as needed for moderate pain.   NEXIUM PO Take 1 tablet by mouth daily as needed (indigestion).   omeprazole 20 MG capsule Commonly known as: PRILOSEC Take 20 mg by mouth daily as needed (indigestion).  Alternates with nexium   POTASSIMIN PO Take 1 tablet by mouth in the morning and at bedtime.  Follow-up Information     Julio Sicks, MD. Schedule an appointment as soon as possible for a visit in 2 week(s).   Specialty: Neurosurgery Contact information: [address] [place] [address] [phone]                 Signed: Val Eagle, DNP, AGNP-C Nurse Practitioner  [facility] [address], [place], [place] [address] P: [phone]    F: [phone]  02/01/2022, 1:51 PM

## 2022-02-01 NOTE — Evaluation (Signed)
Speech Language Pathology Evaluation Patient Details Name: Eric Conley MRN: [id number] DOB: 1961/05/06 Today's Date: 02/01/2022 Time: 1330-1350 SLP Time Calculation (min) (ACUTE ONLY): 20 min  Problem List:  Patient Active Problem List   Diagnosis Date Noted   Epidural hematoma (HCC) 01/24/2022   SDH (subdural hematoma) (HCC) 01/24/2022   Severe sepsis (HCC) 12/30/2021   Hyponatremia 01/25/2021   ARF (acute renal failure) (HCC) 01/25/2021    Class: Acute   Diarrhea 01/25/2021    Class: Acute   Dehydration 01/25/2021   Hypotensive episode 01/25/2021    Class: Acute   Essential hypertension 01/25/2021   Severe recurrent major depression without psychotic features (HCC) 01/11/2021   Alcohol use disorder, severe, dependence (HCC) 01/11/2021   Suicidal thoughts    Past Medical History:  Past Medical History:  Diagnosis Date   Acid reflux    Depression    Hypertension    Past Surgical History: History reviewed. No pertinent surgical history. HPI:  Pt is a 61yo male admitted 01/24/22 after fall at work found down in break room. Pt with TBI with loculated L posterior pariental subdural hematoma with mass effect.  Pt also with subarachoid hemorrhage. Pt with  R scapular fx.  Ortho managing with no surgery and WBAT.  PMH: depression, ? alcohol abuse. SLP cognitive evaluation ordered to determine if patient would benefit from OP SLP.   Assessment / Plan / Recommendation Clinical Impression  Patient presents with a mild cognitive impairment s/p TBI as per this evaluation. Speech and language abilities all WNL. Affect is fairly flat and patient seems somewhat apathetic overall. Per family members, he did not remember that they had told him they were coming back and that he would be discharging home with them temporarily. Patient participated in completing SLUMS examination and received a score of 18 out of possible 30 which is below the range for 'Normal'. Patient did not exhibit adequate  attention and would quickly cease attempts to complete tasks when he was having difficulty. He rarely made eye contact with SLP during this assessment and did not initiate to comment or ask any questions. He did not recall that recommendations from OT and PT were for outpatient therapy. At end of session when SLP asked him if he had any concerns about discharge, he did ask that his employer be contacted to let them know that he would not be able to come back to work for a while until he has had OP rehab. SLP is recommending OP SLP for assessment and treatment of patient's cognitive abilities as he was previously independent and working.    SLP Assessment  SLP Recommendation/Assessment: All further Speech Lanaguage Pathology  needs can be addressed in the next venue of care SLP Visit Diagnosis: Cognitive communication deficit (R41.841)    Recommendations for follow up therapy are one component of a multi-disciplinary discharge planning process, led by the attending physician.  Recommendations may be updated based on patient status, additional functional criteria and insurance authorization.    Follow Up Recommendations  Outpatient SLP    Assistance Recommended at Discharge  Intermittent Supervision/Assistance  Functional Status Assessment Patient has had a recent decline in their functional status and demonstrates the ability to make significant improvements in function in a reasonable and predictable amount of time.  Frequency and Duration   N/A        SLP Evaluation Cognition  Overall Cognitive Status: Impaired/Different from baseline Arousal/Alertness: Awake/alert Orientation Level: Oriented X4 Year: 2023 Month: August Day of Week:  Correct Attention: Sustained Sustained Attention: Impaired Sustained Attention Impairment: Verbal complex Memory: Impaired Memory Impairment: Storage deficit;Decreased recall of new information Awareness: Impaired Awareness Impairment: Emergent  impairment Problem Solving: Impaired Problem Solving Impairment: Verbal complex Executive Function: Initiating Initiating: Impaired Initiating Impairment: Verbal complex Safety/Judgment: Impaired       Comprehension  Auditory Comprehension Overall Auditory Comprehension: Appears within functional limits for tasks assessed    Expression Expression Primary Mode of Expression: Verbal Verbal Expression Overall Verbal Expression: Appears within functional limits for tasks assessed   Oral / Motor  Oral Motor/Sensory Function Overall Oral Motor/Sensory Function: Within functional limits Motor Speech Overall Motor Speech: Appears within functional limits for tasks assessed            Angela Nevin, MA, CCC-SLP Speech Therapy

## 2022-02-01 NOTE — Progress Notes (Signed)
Pt discharge education and instructions completed with pt and family at bedside. Both voices understanding and denies any questions. Pt didn't have any IV access prior to discharge. Pt to pick up electronically sent prescriptions from preferred pharmacy on file. Pt and family to stop by MD's office to pick up requested letter for court. Pt offered wheelchair but both pt and family decline and decided to ambulate off unit. Pt ambulated off floor with belongings to the side. Dionne Bucy RN

## 2022-02-01 NOTE — Progress Notes (Signed)
Physical Therapy Treatment Patient Details Name: Eric Conley MRN: [id number] DOB: Oct 09, 1960 Today's Date: 02/01/2022   History of Present Illness Pt is a 61yo male admitted 01/24/22 after fall at work found down in break room. Pt with TBI with loculated L posterior pariental subdural hematoma with mass effect.  Pt also with subarachoid hemorrhage. Pt with  R scapular fx.  Ortho managing with no surgery and WBAT.  PMH: depression, ? alcohol abuse    PT Comments    The pt was agreeable to session despite reports of generalized pain this morning. He continues to make slow but steady progress with no overt LOB noted, but continued instability with balance challenge. He also continues to present with poor safety awareness and insight to deficits needing increased cues or visual demonstration at times to follow complex instructions. The pt will continue to benefit from skilled PT acutely to address deficits, continue to recommend OP neuro PT when medically stable for d/c.     Recommendations for follow up therapy are one component of a multi-disciplinary discharge planning process, led by the attending physician.  Recommendations may be updated based on patient status, additional functional criteria and insurance authorization.  Follow Up Recommendations  Outpatient PT (neuro)     Assistance Recommended at Discharge Frequent or constant Supervision/Assistance  Patient can return home with the following A little help with walking and/or transfers;A little help with bathing/dressing/bathroom;Assistance with cooking/housework;Direct supervision/assist for medications management;Direct supervision/assist for financial management;Assist for transportation;Help with stairs or ramp for entrance   Equipment Recommendations  None recommended by PT    Recommendations for Other Services       Precautions / Restrictions Precautions Precautions: Fall Restrictions Weight Bearing Restrictions: Yes RUE  Weight Bearing: Weight bearing as tolerated     Mobility  Bed Mobility Overal bed mobility: Needs Assistance Bed Mobility: Supine to Sit     Supine to sit: Supervision     General bed mobility comments: supervision for safety    Transfers Overall transfer level: Needs assistance Equipment used: None Transfers: Sit to/from Stand Sit to Stand: Supervision           General transfer comment: supervision for safety, no overt LOB this session    Ambulation/Gait Ambulation/Gait assistance: Min guard Gait Distance (Feet): 600 Feet Assistive device: None Gait Pattern/deviations: Step-through pattern, Decreased stride length, Drifts right/left, Narrow base of support Gait velocity: 0.56 m/s Gait velocity interpretation: 1.31 - 2.62 ft/sec, indicative of limited community ambulator   General Gait Details: pt with slow gait and mild instability and drifting, especially with increased balance challenge. no overt LOB today   Stairs Stairs: Yes Stairs assistance: Min guard Stair Management: Alternating pattern, Forwards Number of Stairs: 2 General stair comments: step up to platform in hallway x2 each leg. does use hands on wall to steady   Wheelchair Mobility    Modified Rankin (Stroke Patients Only) Modified Rankin (Stroke Patients Only) Pre-Morbid Rankin Score: No symptoms Modified Rankin: Moderately severe disability     Balance Overall balance assessment: Needs assistance Sitting-balance support: Feet supported, No upper extremity supported Sitting balance-Leahy Scale: Good     Standing balance support: During functional activity, No upper extremity supported Standing balance-Leahy Scale: Fair Standing balance comment: Would occaisionally reach out for surfaces when ambulating     Tandem Stance - Right Leg: 8 Tandem Stance - Left Leg: 6         Standardized Balance Assessment Standardized Balance Assessment : Dynamic Gait Index   Dynamic  Gait  Index Level Surface: Mild Impairment Change in Gait Speed: Mild Impairment Gait with Horizontal Head Turns: Mild Impairment Gait with Vertical Head Turns: Mild Impairment Gait and Pivot Turn: Mild Impairment Step Over Obstacle: Mild Impairment Step Around Obstacles: Normal Steps: Mild Impairment Total Score: 17      Cognition Arousal/Alertness: Awake/alert Behavior During Therapy: Flat affect Overall Cognitive Status: Impaired/Different from baseline Area of Impairment: Attention, Memory, Following commands, Safety/judgement, Awareness, Problem solving                   Current Attention Level: Sustained Memory: Decreased short-term memory, Decreased recall of precautions Following Commands: Follows one step commands with increased time, Follows multi-step commands inconsistently Safety/Judgement: Decreased awareness of safety, Decreased awareness of deficits Awareness: Emergent Problem Solving: Slow processing, Requires verbal cues General Comments: pt required repeated cues to complete complex tasks, but then was able to complete with mild slowing of gait. Following instructions better with multimodal cues. remains slightly impulsive with mobility and decreased safety awareness           General Comments General comments (skin integrity, edema, etc.): mild impulsivity, poor insight to deficits      Pertinent Vitals/Pain Pain Assessment Pain Assessment: Faces Faces Pain Scale: Hurts little more Pain Location: "all over" head, R shoulder, and back. after walking pt also reports legs feel sore Pain Descriptors / Indicators: Discomfort, Grimacing, Guarding, Sore Pain Intervention(s): Monitored during session, Limited activity within patient's tolerance, Repositioned, Premedicated before session     PT Goals (current goals can now be found in the care plan section) Acute Rehab PT Goals Patient Stated Goal: to feel better PT Goal Formulation: With patient Time For  Goal Achievement: 02/09/22 Potential to Achieve Goals: Good Progress towards PT goals: Progressing toward goals    Frequency    Min 4X/week      PT Plan Current plan remains appropriate       AM-PAC PT "6 Clicks" Mobility   Outcome Measure  Help needed turning from your back to your side while in a flat bed without using bedrails?: A Little Help needed moving from lying on your back to sitting on the side of a flat bed without using bedrails?: A Little Help needed moving to and from a bed to a chair (including a wheelchair)?: A Little Help needed standing up from a chair using your arms (e.g., wheelchair or bedside chair)?: A Little Help needed to walk in hospital room?: A Little Help needed climbing 3-5 steps with a railing? : A Little 6 Click Score: 18    End of Session Equipment Utilized During Treatment: Gait belt Activity Tolerance: Patient tolerated treatment well Patient left: in bed;with call bell/phone within reach;with bed alarm set Nurse Communication: Mobility status PT Visit Diagnosis: Unsteadiness on feet (R26.81);Other abnormalities of gait and mobility (R26.89);Difficulty in walking, not elsewhere classified (R26.2);Dizziness and giddiness (R42)     Time: 3267-1245 PT Time Calculation (min) (ACUTE ONLY): 16 min  Charges:  $Therapeutic Activity: 8-22 mins                     Vickki Muff, PT, DPT   Acute Rehabilitation Department   Ronnie Derby 02/01/2022, 8:57 AM

## 2022-02-09 ENCOUNTER — Encounter (INDEPENDENT_AMBULATORY_CARE_PROVIDER_SITE_OTHER): Payer: Self-pay | Admitting: *Deleted

## 2022-03-20 ENCOUNTER — Emergency Department (HOSPITAL_COMMUNITY): Payer: 59

## 2022-03-20 ENCOUNTER — Inpatient Hospital Stay (HOSPITAL_COMMUNITY)
Admission: EM | Admit: 2022-03-20 | Discharge: 2022-03-31 | DRG: 101 | Disposition: A | Discharge status: Home Health | Payer: 59 | Attending: Family Medicine | Admitting: Family Medicine

## 2022-03-20 ENCOUNTER — Other Ambulatory Visit: Payer: Self-pay

## 2022-03-20 ENCOUNTER — Inpatient Hospital Stay (HOSPITAL_COMMUNITY): Payer: 59

## 2022-03-20 ENCOUNTER — Encounter (HOSPITAL_COMMUNITY): Payer: Self-pay | Admitting: Emergency Medicine

## 2022-03-20 DIAGNOSIS — F10931 Alcohol use, unspecified with withdrawal delirium: Secondary | ICD-10-CM | POA: Diagnosis not present

## 2022-03-20 DIAGNOSIS — R569 Unspecified convulsions: Secondary | ICD-10-CM | POA: Diagnosis present

## 2022-03-20 DIAGNOSIS — F17210 Nicotine dependence, cigarettes, uncomplicated: Secondary | ICD-10-CM | POA: Diagnosis present

## 2022-03-20 DIAGNOSIS — E875 Hyperkalemia: Secondary | ICD-10-CM | POA: Diagnosis present

## 2022-03-20 DIAGNOSIS — I471 Supraventricular tachycardia, unspecified: Secondary | ICD-10-CM | POA: Clinically undetermined

## 2022-03-20 DIAGNOSIS — L02413 Cutaneous abscess of right upper limb: Secondary | ICD-10-CM | POA: Diagnosis not present

## 2022-03-20 DIAGNOSIS — F1020 Alcohol dependence, uncomplicated: Secondary | ICD-10-CM

## 2022-03-20 DIAGNOSIS — I808 Phlebitis and thrombophlebitis of other sites: Secondary | ICD-10-CM | POA: Diagnosis not present

## 2022-03-20 DIAGNOSIS — Z79899 Other long term (current) drug therapy: Secondary | ICD-10-CM | POA: Diagnosis not present

## 2022-03-20 DIAGNOSIS — R197 Diarrhea, unspecified: Secondary | ICD-10-CM

## 2022-03-20 DIAGNOSIS — I4719 Other supraventricular tachycardia: Secondary | ICD-10-CM | POA: Diagnosis present

## 2022-03-20 DIAGNOSIS — F10231 Alcohol dependence with withdrawal delirium: Secondary | ICD-10-CM | POA: Diagnosis present

## 2022-03-20 DIAGNOSIS — K219 Gastro-esophageal reflux disease without esophagitis: Secondary | ICD-10-CM

## 2022-03-20 DIAGNOSIS — I629 Nontraumatic intracranial hemorrhage, unspecified: Secondary | ICD-10-CM | POA: Diagnosis present

## 2022-03-20 DIAGNOSIS — F05 Delirium due to known physiological condition: Secondary | ICD-10-CM | POA: Diagnosis not present

## 2022-03-20 DIAGNOSIS — I10 Essential (primary) hypertension: Secondary | ICD-10-CM | POA: Diagnosis present

## 2022-03-20 DIAGNOSIS — L03113 Cellulitis of right upper limb: Secondary | ICD-10-CM | POA: Diagnosis not present

## 2022-03-20 DIAGNOSIS — M25539 Pain in unspecified wrist: Secondary | ICD-10-CM | POA: Diagnosis present

## 2022-03-20 DIAGNOSIS — F17200 Nicotine dependence, unspecified, uncomplicated: Secondary | ICD-10-CM

## 2022-03-20 DIAGNOSIS — M25531 Pain in right wrist: Secondary | ICD-10-CM

## 2022-03-20 DIAGNOSIS — I82611 Acute embolism and thrombosis of superficial veins of right upper extremity: Secondary | ICD-10-CM | POA: Diagnosis not present

## 2022-03-20 DIAGNOSIS — F10939 Alcohol use, unspecified with withdrawal, unspecified: Secondary | ICD-10-CM | POA: Diagnosis not present

## 2022-03-20 DIAGNOSIS — Z885 Allergy status to narcotic agent status: Secondary | ICD-10-CM | POA: Diagnosis not present

## 2022-03-20 DIAGNOSIS — I809 Phlebitis and thrombophlebitis of unspecified site: Secondary | ICD-10-CM | POA: Diagnosis present

## 2022-03-20 DIAGNOSIS — E876 Hypokalemia: Secondary | ICD-10-CM

## 2022-03-20 DIAGNOSIS — Z91048 Other nonmedicinal substance allergy status: Secondary | ICD-10-CM

## 2022-03-20 DIAGNOSIS — I361 Nonrheumatic tricuspid (valve) insufficiency: Secondary | ICD-10-CM | POA: Diagnosis not present

## 2022-03-20 DIAGNOSIS — R509 Fever, unspecified: Secondary | ICD-10-CM | POA: Diagnosis not present

## 2022-03-20 DIAGNOSIS — L03119 Cellulitis of unspecified part of limb: Secondary | ICD-10-CM | POA: Diagnosis not present

## 2022-03-20 LAB — RAPID URINE DRUG SCREEN, HOSP PERFORMED
Amphetamines: NOT DETECTED
Barbiturates: NOT DETECTED
Benzodiazepines: POSITIVE — AB
Cocaine: NOT DETECTED
Opiates: NOT DETECTED
Tetrahydrocannabinol: NOT DETECTED

## 2022-03-20 LAB — CBC WITH DIFFERENTIAL/PLATELET
Abs Immature Granulocytes: 0.04 10*3/uL (ref 0.00–0.07)
Basophils Absolute: 0.1 10*3/uL (ref 0.0–0.1)
Basophils Relative: 1 %
Eosinophils Absolute: 0 10*3/uL (ref 0.0–0.5)
Eosinophils Relative: 0 %
HCT: 39.7 % (ref 39.0–52.0)
Hemoglobin: 13.7 g/dL (ref 13.0–17.0)
Immature Granulocytes: 1 %
Lymphocytes Relative: 19 %
Lymphs Abs: 1 10*3/uL (ref 0.7–4.0)
MCH: 33.6 pg (ref 26.0–34.0)
MCHC: 34.5 g/dL (ref 30.0–36.0)
MCV: 97.3 fL (ref 80.0–100.0)
Monocytes Absolute: 0.4 10*3/uL (ref 0.1–1.0)
Monocytes Relative: 8 %
Neutro Abs: 3.7 10*3/uL (ref 1.7–7.7)
Neutrophils Relative %: 71 %
Platelets: 208 10*3/uL (ref 150–400)
RBC: 4.08 MIL/uL — ABNORMAL LOW (ref 4.22–5.81)
RDW: 15.4 % (ref 11.5–15.5)
WBC: 5.2 10*3/uL (ref 4.0–10.5)
nRBC: 0 % (ref 0.0–0.2)

## 2022-03-20 LAB — COMPREHENSIVE METABOLIC PANEL
ALT: 14 U/L (ref 0–44)
AST: 26 U/L (ref 15–41)
Albumin: 3.4 g/dL — ABNORMAL LOW (ref 3.5–5.0)
Alkaline Phosphatase: 101 U/L (ref 38–126)
Anion gap: 10 (ref 5–15)
BUN: <5 mg/dL — ABNORMAL LOW (ref 6–20)
CO2: 26 mmol/L (ref 22–32)
Calcium: 6.1 mg/dL — CL (ref 8.9–10.3)
Chloride: 98 mmol/L (ref 98–111)
Creatinine, Ser: 0.89 mg/dL (ref 0.61–1.24)
GFR, Estimated: >60 mL/min (ref >60)
Glucose, Bld: 107 mg/dL — ABNORMAL HIGH (ref 70–99)
Potassium: 2.8 mmol/L — ABNORMAL LOW (ref 3.5–5.1)
Sodium: 134 mmol/L — ABNORMAL LOW (ref 135–145)
Total Bilirubin: 0.8 mg/dL (ref 0.3–1.2)
Total Protein: 6.7 g/dL (ref 6.5–8.1)

## 2022-03-20 LAB — URINALYSIS, ROUTINE W REFLEX MICROSCOPIC
Bilirubin Urine: NEGATIVE
Glucose, UA: NEGATIVE mg/dL
Hgb urine dipstick: NEGATIVE
Ketones, ur: NEGATIVE mg/dL
Leukocytes,Ua: NEGATIVE
Nitrite: NEGATIVE
Protein, ur: NEGATIVE mg/dL
Specific Gravity, Urine: 1.002 — ABNORMAL LOW (ref 1.005–1.030)
pH: 6 (ref 5.0–8.0)

## 2022-03-20 LAB — CBG MONITORING, ED
Glucose-Capillary: 133 mg/dL — ABNORMAL HIGH (ref 70–99)
Glucose-Capillary: 115 mg/dL — ABNORMAL HIGH (ref 70–99)

## 2022-03-20 LAB — LACTIC ACID, PLASMA: Lactic Acid, Venous: 2.2 mmol/L (ref 0.5–1.9)

## 2022-03-20 LAB — MAGNESIUM: Magnesium: <0.5 mg/dL — CL (ref 1.7–2.4)

## 2022-03-20 LAB — ETHANOL: Alcohol, Ethyl (B): <10 mg/dL (ref <10)

## 2022-03-20 LAB — PROTIME-INR
INR: 1.1 (ref 0.8–1.2)
Prothrombin Time: 14.1 seconds (ref 11.4–15.2)

## 2022-03-20 LAB — FOLATE: Folate: 14.8 ng/mL (ref >5.9)

## 2022-03-20 LAB — VITAMIN B12: Vitamin B-12: 652 pg/mL (ref 180–914)

## 2022-03-20 MED ORDER — POTASSIUM CHLORIDE 20 MEQ PO PACK
40.0000 meq | PACK | Freq: Once | ORAL | Status: AC
  Administered 2022-03-20: 40 meq via ORAL
  Filled 2022-03-20: qty 2

## 2022-03-20 MED ORDER — LORAZEPAM 2 MG/ML IJ SOLN: 0.0000 mg | Freq: Two times a day (BID) | INTRAMUSCULAR | Status: DC

## 2022-03-20 MED ORDER — LORAZEPAM 2 MG/ML IJ SOLN
2.0000 mg | Freq: Once | INTRAMUSCULAR | Status: AC
  Administered 2022-03-20: 2 mg via INTRAVENOUS
  Filled 2022-03-20: qty 1

## 2022-03-20 MED ORDER — THIAMINE HCL 100 MG/ML IJ SOLN
100.0000 mg | Freq: Every day | INTRAMUSCULAR | Status: DC
  Filled 2022-03-20 (×2): qty 2

## 2022-03-20 MED ORDER — THIAMINE MONONITRATE 100 MG PO TABS
100.0000 mg | ORAL_TABLET | Freq: Every day | ORAL | Status: DC
  Administered 2022-03-20 – 2022-03-31 (×12): 100 mg via ORAL
  Filled 2022-03-20 (×12): qty 1

## 2022-03-20 MED ORDER — ACETAMINOPHEN 650 MG RE SUPP: 650.0000 mg | Freq: Four times a day (QID) | RECTAL | Status: DC | PRN

## 2022-03-20 MED ORDER — MORPHINE SULFATE (PF) 2 MG/ML IV SOLN
2.0000 mg | INTRAVENOUS | Status: DC | PRN
  Administered 2022-03-22: 2 mg via INTRAVENOUS
  Filled 2022-03-20: qty 1

## 2022-03-20 MED ORDER — FOLIC ACID 5 MG/ML IJ SOLN
1.0000 mg | Freq: Once | INTRAMUSCULAR | Status: AC
  Administered 2022-03-20: 1 mg via INTRAVENOUS
  Filled 2022-03-20: qty 0.2

## 2022-03-20 MED ORDER — PANTOPRAZOLE SODIUM 40 MG PO TBEC
40.0000 mg | DELAYED_RELEASE_TABLET | Freq: Every day | ORAL | Status: DC
  Administered 2022-03-20 – 2022-03-31 (×12): 40 mg via ORAL
  Filled 2022-03-20 (×12): qty 1

## 2022-03-20 MED ORDER — KCL-LACTATED RINGERS-D5W 20 MEQ/L IV SOLN
INTRAVENOUS | Status: DC
  Filled 2022-03-20 (×11): qty 1000

## 2022-03-20 MED ORDER — ACETAMINOPHEN 325 MG PO TABS
650.0000 mg | ORAL_TABLET | Freq: Four times a day (QID) | ORAL | Status: DC | PRN
  Administered 2022-03-24 – 2022-03-28 (×3): 650 mg via ORAL
  Filled 2022-03-20 (×3): qty 2

## 2022-03-20 MED ORDER — LORAZEPAM 1 MG PO TABS: 1.0000 mg | ORAL_TABLET | ORAL | Status: AC | PRN

## 2022-03-20 MED ORDER — CALCIUM GLUCONATE-NACL 1-0.675 GM/50ML-% IV SOLN
1.0000 g | Freq: Once | INTRAVENOUS | Status: AC
  Administered 2022-03-20: 1000 mg via INTRAVENOUS
  Filled 2022-03-20: qty 50

## 2022-03-20 MED ORDER — LORAZEPAM 2 MG/ML IJ SOLN
1.0000 mg | INTRAMUSCULAR | Status: AC | PRN
  Administered 2022-03-22 (×2): 2 mg via INTRAVENOUS
  Administered 2022-03-22: 3 mg via INTRAVENOUS
  Administered 2022-03-22 (×2): 2 mg via INTRAVENOUS
  Administered 2022-03-23: 3 mg via INTRAVENOUS
  Filled 2022-03-20 (×2): qty 1
  Filled 2022-03-20 (×2): qty 2
  Filled 2022-03-20 (×3): qty 1

## 2022-03-20 MED ORDER — SODIUM CHLORIDE 0.9 % IV SOLN
1.0000 mg | Freq: Once | INTRAVENOUS | Status: DC
  Filled 2022-03-20: qty 0.2

## 2022-03-20 MED ORDER — ONDANSETRON HCL 4 MG PO TABS: 4.0000 mg | ORAL_TABLET | Freq: Four times a day (QID) | ORAL | Status: DC | PRN

## 2022-03-20 MED ORDER — ADULT MULTIVITAMIN W/MINERALS CH
1.0000 | ORAL_TABLET | Freq: Once | ORAL | Status: DC
  Filled 2022-03-20: qty 1

## 2022-03-20 MED ORDER — ADULT MULTIVITAMIN W/MINERALS CH: 1.0000 | ORAL_TABLET | Freq: Every day | ORAL | Status: DC

## 2022-03-20 MED ORDER — LEVETIRACETAM IN NACL 1000 MG/100ML IV SOLN
1000.0000 mg | Freq: Once | INTRAVENOUS | Status: AC
  Administered 2022-03-20: 1000 mg via INTRAVENOUS
  Filled 2022-03-20: qty 100

## 2022-03-20 MED ORDER — POTASSIUM CHLORIDE 10 MEQ/100ML IV SOLN
10.0000 meq | Freq: Once | INTRAVENOUS | Status: AC
  Administered 2022-03-20: 10 meq via INTRAVENOUS
  Filled 2022-03-20: qty 100

## 2022-03-20 MED ORDER — MAGNESIUM SULFATE 2 GM/50ML IV SOLN
2.0000 g | Freq: Once | INTRAVENOUS | Status: AC
  Administered 2022-03-20: 2 g via INTRAVENOUS
  Filled 2022-03-20: qty 50

## 2022-03-20 MED ORDER — OXYCODONE HCL 5 MG PO TABS
5.0000 mg | ORAL_TABLET | ORAL | Status: DC | PRN
  Administered 2022-03-23 – 2022-03-30 (×8): 5 mg via ORAL
  Filled 2022-03-20 (×8): qty 1

## 2022-03-20 MED ORDER — LORAZEPAM 2 MG/ML IJ SOLN
0.0000 mg | Freq: Four times a day (QID) | INTRAMUSCULAR | Status: DC
  Administered 2022-03-21: 1 mg via INTRAVENOUS
  Administered 2022-03-21 – 2022-03-22 (×2): 2 mg via INTRAVENOUS
  Filled 2022-03-20 (×2): qty 1

## 2022-03-20 MED ORDER — THIAMINE HCL 100 MG/ML IJ SOLN
100.0000 mg | Freq: Once | INTRAMUSCULAR | Status: AC
  Administered 2022-03-20: 100 mg via INTRAVENOUS
  Filled 2022-03-20: qty 2

## 2022-03-20 MED ORDER — FOLIC ACID 1 MG PO TABS
1.0000 mg | ORAL_TABLET | Freq: Every day | ORAL | Status: DC
  Administered 2022-03-20 – 2022-03-31 (×12): 1 mg via ORAL
  Filled 2022-03-20 (×12): qty 1

## 2022-03-20 MED ORDER — ONDANSETRON HCL 4 MG/2ML IJ SOLN: 4.0000 mg | Freq: Four times a day (QID) | INTRAMUSCULAR | Status: DC | PRN

## 2022-03-20 NOTE — ED Notes (Signed)
Date and time results received: 03/20/22 1926   Test: Ca+ Critical Value: 6.1  Name of Provider Notified: Sabra Heck, MD

## 2022-03-20 NOTE — Assessment & Plan Note (Signed)
-   Patient reports he smokes 2 or 3 cigarettes a day - nicotine patch ordered  - Counseled on importance of cessation

## 2022-03-20 NOTE — ED Triage Notes (Signed)
Pt arrived via [facility] with original c/o numbness and tingling in both arms. Per EMS, pt experienced a seizure in route in which pt became apneic for about 45 seconds and turned purple in color. Pt does not remember having seizure. EMS gave pt 2.5 mg of Versed IV

## 2022-03-20 NOTE — Assessment & Plan Note (Addendum)
-   Patient reports right wrist is painful - Slightly swollen compared to left wrist - Patient does not remember injuring it, so the possibility that it was her during seizure activity - X-ray with no acute fractures found, wrist splint ordered for comfort.

## 2022-03-20 NOTE — Assessment & Plan Note (Addendum)
-   2 days of nonbloody diarrhea reported PTA - contributed to to electrolyte abnormalities - The patient continues to have diarrhea here we will get stool studies - No abdominal pain presently - resolved.  Tolerating diet

## 2022-03-20 NOTE — ED Notes (Signed)
Pt reports he has not been eating the past couple of days due to not having an appetite. Pt reports to this RN that he felt light-headed yesterday and felt like he was going to pass out.

## 2022-03-20 NOTE — Assessment & Plan Note (Addendum)
-   secondary to intracranial bleed, SDH, hemorrhagic brain contusions -severe hypomagnesemia may have contributed - Neuro consulted and recommending keppra 500 mg BID, continue at discharge  - Continue CIWA protocol for alcohol withdrawal  -Continue to monitor and replete magnesium - Seizure precautions, no driving - outpatient follow up with neurology, referral made to [place] -no further seizures during hospitalization

## 2022-03-20 NOTE — Assessment & Plan Note (Addendum)
-   Calcium 6.1 upon presentation -Corrects to 6.6 for albumin - Replaced with 1 g of calcium gluconate and tums oral TID -  REPLETED

## 2022-03-20 NOTE — Assessment & Plan Note (Addendum)
-   admission Magnesium is undetectable at less than 0.5 -Secondary to poor p.o. intake and GI losses - repleting IV again - start daily po mag ox

## 2022-03-20 NOTE — ED Notes (Signed)
This RN called the [facility], Tim, about IV medication folic acid drip, as it is not stocked in the ER

## 2022-03-20 NOTE — Assessment & Plan Note (Signed)
-   Potassium is 2.8 - Secondary to poor p.o. intake and GI losses - Replace with 10 mEq of potassium IV - Continue replacement with 40 mEq p.o. - Continue IV maintenance fluids with potassium additive - Recheck at midnight

## 2022-03-20 NOTE — H&P (Signed)
History and Physical    Patient: Eric Conley [id number] DOB: 09/18/1960 DOA: 03/20/2022 DOS: the patient was seen and examined on 03/20/2022 PCP: Sharilyn Sites, MD  Patient coming from: Home  Chief Complaint:  Chief Complaint  Patient presents with   Seizures   HPI: DAYTON ROPP is a 61 y.o. male with medical history significant of acid reflux, depression, hypertension, alcohol dependence, subdural hematoma from 6 weeks ago, and more presents the ED with a chief complaint of seizure.  Patient gives history that he has given to ED staff.  To me, patient reports he came into the ED because he was lightheaded and hungry.  He reports that he is lightheaded probably because he was hungry.  His last normal meal was 2 or 3 days ago.  He has had diarrhea in the interval.  His diarrhea is nonbloody and happens 3 times a day.  He has no associated abdominal pain.  He blames some [place] takeout food for his diarrhea.  He reports that the lightheadedness happen when he woke up.  He reports that it was present before he had any postural changes.  He reports that his hands were shaky because he had not eaten.  He reports no loss of consciousness, no loss of time, no falls.  Patient reports that he used to have a history of alcohol dependence but does not anymore.  He reports that his last drink was yesterday.  It was 1 beer.  When asked him that she normally drinks he will not give an answer.  He does report that he only drinks on days when he does not have to work.  When it is brought to his attention that he is has not had to work in several weeks because of his recent trauma, he reports that he has not been drinking has been in the hospital.  In the ED he reported that he had woken up this morning feeling tingly and having intermittent numbness to his left arm.  He reports he woke up with the symptoms today.  Did not get any better throughout the day.  EMS was called and in route to the hospital patient  had witnessed tonic-clonic activity which lasted for about a minute.  2-1/2 mg of Versed was given which aborted the tonic-clonic activity.  Patient was postictal on arrival to the hospital.  Patient does not remember any of this.  Patient does drink and does smoke as discussed above.  He does not use illicit drugs.  He is vaccinated for COVID.  Patient is full code. Review of Systems: As mentioned in the history of present illness. All other systems reviewed and are negative. Past Medical History:  Diagnosis Date   Acid reflux    Depression    Hypertension    History reviewed. No pertinent surgical history. Social History:  reports that he has been smoking cigarettes. He has been smoking an average of .25 packs per day. He has never used smokeless tobacco. He reports current alcohol use of about 7.0 standard drinks of alcohol per week. He reports that he does not currently use drugs.  Allergies  Allergen Reactions   Codeine    Poison Sumac Extract     History reviewed. No pertinent family history.  Prior to Admission medications   Medication Sig Start Date End Date Taking? Authorizing Provider  diazepam (VALIUM) 10 MG tablet Take 10 mg by mouth 2 (two) times daily. 12/09/21   [provider]  docusate sodium (  COLACE) 100 MG capsule Take 1 capsule (100 mg total) by mouth 2 (two) times daily. 02/01/22   Viona Gilmore D, NP  Esomeprazole Magnesium (NEXIUM PO) Take 1 tablet by mouth daily as needed (indigestion).    [provider]  HYDROcodone-acetaminophen (NORCO/VICODIN) 5-325 MG tablet Take 1-2 tablets by mouth every 4 (four) hours as needed for moderate pain. 02/01/22   Viona Gilmore D, NP  omeprazole (PRILOSEC) 20 MG capsule Take 20 mg by mouth daily as needed (indigestion). Alternates with nexium    [provider]  Potassium (POTASSIMIN PO) Take 1 tablet by mouth in the morning and at bedtime.    [provider]    Physical Exam: Vitals:    03/20/22 1930 03/20/22 2000 03/20/22 2130 03/20/22 2230  BP: 116/78 106/84 115/82 110/75  Pulse: (!) 105 (!) 103 94 97  Resp: (!) 23 (!) 23 (!) 22 (!) 21  Temp:   98.9 F (37.2 C)   TempSrc:   Oral   SpO2: 98% 96% 98% 93%   1.  General: Patient lying supine in bed,  no acute distress   2. Psychiatric: Alert and oriented x 3, mood and behavior normal for situation, pleasant and cooperative with exam   3. Neurologic: Speech and language are normal, face is symmetric, moves all 4 extremities voluntarily, positive for asterixis   4. HEENMT:  Head is atraumatic, normocephalic, pupils reactive to light, neck is supple, trachea is midline, mucous membranes are moist   5. Respiratory : Lungs are clear to auscultation bilaterally without wheezing, rhonchi, rales, no cyanosis, no increase in work of breathing or accessory muscle use   6. Cardiovascular : Heart rate tachycardic, rhythm is regular, no murmurs, rubs or gallops, no peripheral edema, peripheral pulses palpated   7. Gastrointestinal:  Abdomen is soft, nondistended, nontender to palpation bowel sounds active, no masses or organomegaly palpated   8. Skin:  Skin is warm, dry and intact without rashes, acute lesions, or ulcers on limited exam   9.Musculoskeletal:  No acute deformities or trauma, no asymmetry in tone, no peripheral edema, peripheral pulses palpated, no tenderness to palpation in the extremities  Data Reviewed: In the ED Temp 98.9, heart rate 105-122, respiratory rate 23-25, blood pressure 115/78-128/92, satting at 98% No leukocytosis with a white blood cell count of 5.2, hemoglobin 13.7, platelets 208 Chemistry reveals hypokalemia 2.8, hypochloremia 98 hypomagnesemia undetectable Lactic acid 2.2 CT head shows trace left parietal subdural hematoma and encephalomalacic changes in the left frontal lobe at the site of the prior left frontal contusion-no acute changes EKG shows a heart rate of 114, sinus tach, QTc  458 Patient was given calcium gluconate 1 g, folic acid 1 mg, Keppra, Ativan 2 mg, magnesium 2 g, 10 mEq of potassium, thiamine 100 mg in the ED Admission was requested for multiple electrolyte derangements as well as alcohol withdrawal related seizure  Assessment and Plan: * Seizure ([place]) - Most likely alcohol withdrawal seizure -Hypomagnesemia likely also contributing - Initially thought to be posttraumatic seizure given patient's history, and was treated with Keppra - Neuro consult - Continue CIWA protocol -Continue to monitor and replete magnesium - Seizure precautions - Continue to monitor  Diarrhea - 2 days of nonbloody diarrhea reported - Likely contributing to electrolyte abnormalities - The patient continues to have diarrhea here we will get stool studies - No abdominal pain associated - Continue to monitor  Wrist pain - Patient reports right wrist is painful - Slightly swollen compared to left  wrist - Patient does not remember injuring it, so the possibility that it was her during seizure activity - X-ray ordered - Pain control ordered  Tobacco use disorder - Patient reports he smokes 2 or 3 cigarettes a day - Declines nicotine patch at this time - Counseled on importance of cessation  GERD (gastroesophageal reflux disease) - Continue PPI  Hypocalcemia - Calcium 6.1 -Corrects to 6.6 for albumin - Replaced with 1 g of calcium gluconate in the ED -Recheck at midnight  Hypomagnesemia - Magnesium is undetectable at less than 0.5 -Secondary to poor p.o. intake and GI losses - 2 g of magnesium given in the ED - Recheck at midnight, replete as indicated  Hypokalemia - Potassium is 2.8 - Secondary to poor p.o. intake and GI losses - Replace with 10 mEq of potassium IV - Continue replacement with 40 mEq p.o. - Continue IV maintenance fluids with potassium additive - Recheck at midnight  Alcohol use disorder, severe, dependence ([place]) - Family reports that  patient has been a heavy drinker - Patient will not quantify how much he drinks - He does say his last drink was yesterday and he had 1 beer - He reports he only drinks on days that he is not working, but he has been out of work due to his recent trauma with subdural hematoma - Positive for asterixis - CIWA protocol - Most likely patient's seizure was related to alcohol withdrawal - Continue to monitor      Advance Care Planning:   Code Status: Full Code   Consults: Neuro  Family Communication: No family at bedside  Severity of Illness: The appropriate patient status for this patient is INPATIENT. Inpatient status is judged to be reasonable and necessary in order to provide the required intensity of service to ensure the patient's safety. The patient's presenting symptoms, physical exam findings, and initial radiographic and laboratory data in the context of their chronic comorbidities is felt to place them at high risk for further clinical deterioration. Furthermore, it is not anticipated that the patient will be medically stable for discharge from the hospital within 2 midnights of admission.   * I certify that at the point of admission it is my clinical judgment that the patient will require inpatient hospital care spanning beyond 2 midnights from the point of admission due to high intensity of service, high risk for further deterioration and high frequency of surveillance required.*  Author: Rolla Plate, DO 03/20/2022 10:53 PM  For on call review [email or web address].

## 2022-03-20 NOTE — Assessment & Plan Note (Signed)
Continue PPI ?

## 2022-03-20 NOTE — ED Notes (Signed)
Date and time results received: 03/20/22 1913   Test: LACTIC Critical Value: 2.2  Name of Provider Notified: Sabra Heck, MD

## 2022-03-20 NOTE — Assessment & Plan Note (Signed)
-   Family reports that patient has been a heavy drinker - Patient will not quantify how much he drinks - He does say his last drink was yesterday and he had 1 beer - He reports he only drinks on days that he is not working, but he has been out of work due to his recent trauma with subdural hematoma - Positive for asterixis - CIWA protocol - Most likely patient's seizure was related to alcohol withdrawal - Continue to monitor

## 2022-03-20 NOTE — ED Notes (Signed)
Date and time results received: 03/20/22 1927   Test: Mg+ Critical Value: < 0.5  Name of Provider Notified: Sabra Heck, MD

## 2022-03-20 NOTE — ED Provider Notes (Signed)
[facility] EMERGENCY DEPARTMENT Provider Note   CSN: [id number] Arrival date & time: 03/20/22  1732     History  Chief Complaint  Patient presents with   Seizures    Eric Conley is a 61 y.o. male.   Seizures    This patient is a 61 year old male, he has a recent history of having a subdural hematoma as well as some intraparenchymal hemorrhage after having a fall, that occurred in August on the 21st, 2023.  The patient had been admitted to the hospital and taken care of by the neurosurgery service.  He spent from the 21st through the 29th in the hospital after having epidural and subdural hematomas.  This was from a ground-level fall, he had a left frontal contusion with the associated left posterior parietal hematoma.  Based on the discharge summary the patient was stable at discharge, he was to take diazepam twice a day, Vicodin every 4 hours as needed for pain and omeprazole for acid reflux.  It appears that the patient was on the Valium prior to the fall and the bleeding.  The patient presents to the hospital today after having woken up this morning with tingling and intermittent numbness in his left arm, he went to sleep last night at 11:00 and woke up with the symptoms today.  The paramedics were called for this reason because it did not get any better throughout the day.  When they found the patient he was okay, on the ambulance the patient had witnessed tonic-clonic activity which lasted for about a minute, he was administered 2.5 mg of Versed through an IV and had cessation of the seizure activity, he had had some apnea during the seizure but that has resolved by the time he is arrived he is less postictal and starting to answer questions.  The patient is not able to give me any history at this time as he is postictal.  No history of seizures  Home Medications Prior to Admission medications   Medication Sig Start Date End Date Taking? Authorizing Provider  diazepam (VALIUM) 10  MG tablet Take 10 mg by mouth 2 (two) times daily. 12/09/21   [provider]  docusate sodium (COLACE) 100 MG capsule Take 1 capsule (100 mg total) by mouth 2 (two) times daily. 02/01/22   Val Eagle D, NP  Esomeprazole Magnesium (NEXIUM PO) Take 1 tablet by mouth daily as needed (indigestion).    [provider]  HYDROcodone-acetaminophen (NORCO/VICODIN) 5-325 MG tablet Take 1-2 tablets by mouth every 4 (four) hours as needed for moderate pain. 02/01/22   Val Eagle D, NP  omeprazole (PRILOSEC) 20 MG capsule Take 20 mg by mouth daily as needed (indigestion). Alternates with nexium    [provider]  Potassium (POTASSIMIN PO) Take 1 tablet by mouth in the morning and at bedtime.    [provider]      Allergies    Codeine and Poison sumac extract    Review of Systems   Review of Systems  Neurological:  Positive for seizures.  All other systems reviewed and are negative.   Physical Exam Updated Vital Signs BP 116/78   Pulse (!) 105   Temp 98.9 F (37.2 C) (Oral)   Resp (!) 23   SpO2 98%  Physical Exam Vitals and nursing note reviewed.  Constitutional:      General: He is not in acute distress.    Appearance: He is well-developed.  HENT:     Head: Normocephalic  and atraumatic.     Mouth/Throat:     Pharynx: No oropharyngeal exudate.  Eyes:     General: No scleral icterus.       Right eye: No discharge.        Left eye: No discharge.     Conjunctiva/sclera: Conjunctivae normal.     Pupils: Pupils are equal, round, and reactive to light.  Neck:     Thyroid: No thyromegaly.     Vascular: No JVD.  Cardiovascular:     Rate and Rhythm: Normal rate and regular rhythm.     Heart sounds: Normal heart sounds. No murmur heard.    No friction rub. No gallop.  Pulmonary:     Effort: Pulmonary effort is normal. No respiratory distress.     Breath sounds: Normal breath sounds. No wheezing or rales.  Abdominal:     General: Bowel sounds  are normal. There is no distension.     Palpations: Abdomen is soft. There is no mass.     Tenderness: There is no abdominal tenderness.  Musculoskeletal:        General: No tenderness. Normal range of motion.     Cervical back: Normal range of motion and neck supple.     Right lower leg: No edema.     Left lower leg: No edema.  Lymphadenopathy:     Cervical: No cervical adenopathy.  Skin:    General: Skin is warm and dry.     Findings: No erythema or rash.  Neurological:     Mental Status: He is alert.     Coordination: Coordination normal.     Comments: Able to move all 4 extremities, states he does have slight decrease sensation on the left arm and right arm - stocking glove but has totally normal strength and coordination.  Mental status is alert, answering questions but very slowly, difffuse UE tremor bilaterally.  Psychiatric:        Behavior: Behavior normal.     ED Results / Procedures / Treatments   Labs (all labs ordered are listed, but only abnormal results are displayed) Labs Reviewed  COMPREHENSIVE METABOLIC PANEL - Abnormal; Notable for the following components:      Result Value   Sodium 134 (*)    Potassium 2.8 (*)    Glucose, Bld 107 (*)    BUN <5 (*)    Calcium 6.1 (*)    Albumin 3.4 (*)    All other components within normal limits  CBC WITH DIFFERENTIAL/PLATELET - Abnormal; Notable for the following components:   RBC 4.08 (*)    All other components within normal limits  MAGNESIUM - Abnormal; Notable for the following components:   Magnesium <0.5 (*)    All other components within normal limits  LACTIC ACID, PLASMA - Abnormal; Notable for the following components:   Lactic Acid, Venous 2.2 (*)    All other components within normal limits  CBG MONITORING, ED - Abnormal; Notable for the following components:   Glucose-Capillary 133 (*)    All other components within normal limits  CBG MONITORING, ED - Abnormal; Notable for the following components:    Glucose-Capillary 115 (*)    All other components within normal limits  PROTIME-INR  ETHANOL  FOLATE  VITAMIN B12    EKG EKG Interpretation  Date/Time:  Sunday March 20 2022 17:57:23 EDT Ventricular Rate:  114 PR Interval:  99 QRS Duration: 87 QT Interval:  332 QTC Calculation: 458 R Axis:   62 Text  Interpretation: Sinus tachycardia Atrial premature complex Borderline T abnormalities, anterior leads Confirmed by Eric Conley ([id number]) on 03/20/2022 6:00:02 PM  Radiology CT HEAD WO CONTRAST  Result Date: 03/20/2022 CLINICAL DATA:  New onset seizure, recent trauma with subdural hematoma. EXAM: CT HEAD WITHOUT CONTRAST TECHNIQUE: Contiguous axial images were obtained from the base of the skull through the vertex without intravenous contrast. RADIATION DOSE REDUCTION: This exam was performed according to the departmental dose-optimization program which includes automated exposure control, adjustment of the mA and/or kV according to patient size and/or use of iterative reconstruction technique. COMPARISON:  01/25/2022 FINDINGS: Brain: No evidence of acute infarction, hydrocephalus, extra-axial collection or mass lesion/mass effect. Trace residual left parietal subdural hematoma (coronal image 52), measuring 5 mm in thickness, previously 19 mm. Encephalomalacic changes in the left frontal lobe (series 2/image 11). Prior left frontal contusion with subarachnoid hemorrhage has resolved. Subcortical white matter and periventricular small vessel ischemic changes. Vascular: Intracranial atherosclerosis. Skull: Normal. Negative for fracture or focal lesion. Sinuses/Orbits: Minimal partial opacification of the bilateral maxillary sinuses. Mastoid air cells are clear. Other: None. IMPRESSION: Trace residual left parietal subdural hematoma, as above. Encephalomalacic changes in the left frontal lobe at the site of prior left frontal contusion. Small vessel ischemic changes. Electronically Signed   By:  Charline Bills M.D.   On: 03/20/2022 18:13    Procedures .Critical Care  Performed by: Eric Hong, MD Authorized by: Eric Hong, MD   Critical care provider statement:    Critical care time (minutes):  75   Critical care time was exclusive of:  Separately billable procedures and treating other patients and teaching time   Critical care was necessary to treat or prevent imminent or life-threatening deterioration of the following conditions:  CNS failure or compromise (Severe electrolyte disturbance with hypomagnesemia, hypokalemia, hypocalcemia and alcohol withdrawal seizures with persistent tachycardia)   Critical care was time spent personally by me on the following activities:  Development of treatment plan with patient or surrogate, discussions with consultants, evaluation of patient's response to treatment, examination of patient, ordering and review of laboratory studies, ordering and review of radiographic studies, ordering and performing treatments and interventions, pulse oximetry, re-evaluation of patient's condition, review of old charts and obtaining history from patient or surrogate   I assumed direction of critical care for this patient from another provider in my specialty: no     Care discussed with: admitting provider   Comments:            Medications Ordered in ED Medications  multivitamin with minerals tablet 1 tablet (1 tablet Oral Not Given 03/20/22 1854)  magnesium sulfate IVPB 2 g 50 mL (has no administration in time range)  potassium chloride 10 mEq in 100 mL IVPB (has no administration in time range)  calcium gluconate 1 g/ 50 mL sodium chloride IVPB (has no administration in time range)  levETIRAcetam (KEPPRA) IVPB 1000 mg/100 mL premix (0 mg Intravenous Stopped 03/20/22 1840)  LORazepam (ATIVAN) injection 2 mg (2 mg Intravenous Given 03/20/22 1758)  thiamine (VITAMIN B1) injection 100 mg (100 mg Intravenous Given 03/20/22 1847)  folic acid injection 1  mg (1 mg Intravenous Given 03/20/22 1853)    ED Course/ Medical Decision Making/ A&P                           Medical Decision Making Amount and/or Complexity of Data Reviewed Labs: ordered. Radiology: ordered.  Risk Prescription drug management. Decision regarding  hospitalization.    This patient presents to the ED for concern of seizure, left sided numbness / tingling. , this involves an extensive number of treatment options, and is a complaint that carries with it a high risk of complications and morbidity.  The differential diagnosis includes stroke, worsenign bleeding, seizure activity, ETOH withdrawal, radiculopathy - hypoglycemia, cardiac causes.   Co morbidities that complicate the patient evaluation  Recent SDH   Additional history obtained:  Additional history obtained from EMR - recent admission External records from outside source obtained and reviewed including prior H and P notes and NS notes - has hx of heavy ETOH drinking - I discussed this with the family over the phone - they don't live with him - they state that he is a heavy daily drinker but are unaware of his use since d/c from hospital.   Lab Tests:  I Ordered, and personally interpreted labs.  The pertinent results include: Severe hypomagnesemia with a magnesium less than 0.5, potassium less than 2.8, calcium of around 6, alcohol undetectable, lactate of 2.1.  I do not think the patient is septic, this is likely from seizures and alcohol withdrawal   Imaging Studies ordered:  I ordered imaging studies including CT head  I independently visualized and interpreted imaging which showed CT scan of the brain showing no signs of worsening hemorrhage in fact the hemorrhage that had been in the brain has almost completely gone away I agree with the radiologist interpretation   Cardiac Monitoring: / EKG:  The patient was maintained on a cardiac monitor.  I personally viewed and interpreted the cardiac  monitored which showed an underlying rhythm of: Sinus tachycardia   Consultations Obtained:  I requested consultation with the hospitalist,  and discussed lab and imaging findings as well as pertinent plan - they recommend: Admission to higher level of care   Problem List / ED Course / Critical interventions / Medication management  This patient's alcohol withdrawal seizures is the most likely answer.  He has severe electrolyte abnormalities that will require replacement including severe hypomagnesemia which is virtually undetectable.  This has been ordered with 2 g of magnesium sulfate.  Potassium chloride has been ordered, calcium gluconate has been ordered, Ativan was required twice and Keppra was given with IV fluids as well No recurrent seizures in the emergency department but the patient remains tachycardic and mildly tremulous I ordered medication including Ativan and Keppra for seizures Reevaluation of the patient after these medicines showed that the patient critically ill I have reviewed the patients home medicines and have made adjustments as needed   Social Determinants of Health:  Alcohol abuse Recent brain hemorrhage   Test / Admission - Considered:  Admit to higher level of care        Final Clinical Impression(s) / ED Diagnoses Final diagnoses:  Alcohol withdrawal seizure with complication (HCC)  Hypomagnesemia  Hypokalemia  Hypocalcemia     Eric Hong, MD 03/20/22 1941

## 2022-03-20 NOTE — ED Notes (Signed)
Patient transported to CT 

## 2022-03-21 ENCOUNTER — Inpatient Hospital Stay (HOSPITAL_COMMUNITY): Payer: 59

## 2022-03-21 DIAGNOSIS — R569 Unspecified convulsions: Secondary | ICD-10-CM | POA: Diagnosis not present

## 2022-03-21 DIAGNOSIS — F1020 Alcohol dependence, uncomplicated: Secondary | ICD-10-CM | POA: Diagnosis not present

## 2022-03-21 DIAGNOSIS — K219 Gastro-esophageal reflux disease without esophagitis: Secondary | ICD-10-CM | POA: Diagnosis not present

## 2022-03-21 LAB — HIV ANTIBODY (ROUTINE TESTING W REFLEX): HIV Screen 4th Generation wRfx: NONREACTIVE

## 2022-03-21 LAB — COMPREHENSIVE METABOLIC PANEL
ALT: 12 U/L (ref 0–44)
AST: 19 U/L (ref 15–41)
Albumin: 3 g/dL — ABNORMAL LOW (ref 3.5–5.0)
Alkaline Phosphatase: 98 U/L (ref 38–126)
Anion gap: 9 (ref 5–15)
BUN: <5 mg/dL — ABNORMAL LOW (ref 6–20)
CO2: 22 mmol/L (ref 22–32)
Calcium: 6.9 mg/dL — ABNORMAL LOW (ref 8.9–10.3)
Chloride: 107 mmol/L (ref 98–111)
Creatinine, Ser: 0.6 mg/dL — ABNORMAL LOW (ref 0.61–1.24)
GFR, Estimated: >60 mL/min (ref >60)
Glucose, Bld: 123 mg/dL — ABNORMAL HIGH (ref 70–99)
Potassium: 2.6 mmol/L — CL (ref 3.5–5.1)
Sodium: 138 mmol/L (ref 135–145)
Total Bilirubin: 0.9 mg/dL (ref 0.3–1.2)
Total Protein: 6 g/dL — ABNORMAL LOW (ref 6.5–8.1)

## 2022-03-21 LAB — BASIC METABOLIC PANEL
Anion gap: 11 (ref 5–15)
BUN: <5 mg/dL — ABNORMAL LOW (ref 6–20)
CO2: 22 mmol/L (ref 22–32)
Calcium: 6.4 mg/dL — CL (ref 8.9–10.3)
Chloride: 104 mmol/L (ref 98–111)
Creatinine, Ser: 0.73 mg/dL (ref 0.61–1.24)
GFR, Estimated: >60 mL/min (ref >60)
Glucose, Bld: 106 mg/dL — ABNORMAL HIGH (ref 70–99)
Potassium: 2.7 mmol/L — CL (ref 3.5–5.1)
Sodium: 137 mmol/L (ref 135–145)

## 2022-03-21 LAB — CBC WITH DIFFERENTIAL/PLATELET
Abs Immature Granulocytes: 0.03 10*3/uL (ref 0.00–0.07)
Basophils Absolute: 0 10*3/uL (ref 0.0–0.1)
Basophils Relative: 1 %
Eosinophils Absolute: 0.1 10*3/uL (ref 0.0–0.5)
Eosinophils Relative: 2 %
HCT: 38.1 % — ABNORMAL LOW (ref 39.0–52.0)
Hemoglobin: 13.1 g/dL (ref 13.0–17.0)
Immature Granulocytes: 1 %
Lymphocytes Relative: 23 %
Lymphs Abs: 0.9 10*3/uL (ref 0.7–4.0)
MCH: 34.1 pg — ABNORMAL HIGH (ref 26.0–34.0)
MCHC: 34.4 g/dL (ref 30.0–36.0)
MCV: 99.2 fL (ref 80.0–100.0)
Monocytes Absolute: 0.3 10*3/uL (ref 0.1–1.0)
Monocytes Relative: 8 %
Neutro Abs: 2.4 10*3/uL (ref 1.7–7.7)
Neutrophils Relative %: 65 %
Platelets: 202 10*3/uL (ref 150–400)
RBC: 3.84 MIL/uL — ABNORMAL LOW (ref 4.22–5.81)
RDW: 15.4 % (ref 11.5–15.5)
WBC: 3.7 10*3/uL — ABNORMAL LOW (ref 4.0–10.5)
nRBC: 0 % (ref 0.0–0.2)

## 2022-03-21 LAB — MAGNESIUM
Magnesium: 1.4 mg/dL — ABNORMAL LOW (ref 1.7–2.4)
Magnesium: 2.4 mg/dL (ref 1.7–2.4)

## 2022-03-21 LAB — TSH: TSH: 0.818 u[IU]/mL (ref 0.350–4.500)

## 2022-03-21 MED ORDER — POTASSIUM CHLORIDE 20 MEQ PO PACK
40.0000 meq | PACK | Freq: Once | ORAL | Status: AC
  Administered 2022-03-21: 40 meq via ORAL
  Filled 2022-03-21: qty 2

## 2022-03-21 MED ORDER — LEVETIRACETAM 500 MG PO TABS
500.0000 mg | ORAL_TABLET | Freq: Two times a day (BID) | ORAL | Status: DC
  Administered 2022-03-21 – 2022-03-31 (×21): 500 mg via ORAL
  Filled 2022-03-21 (×21): qty 1

## 2022-03-21 MED ORDER — CALCIUM GLUCONATE-NACL 1-0.675 GM/50ML-% IV SOLN
1.0000 g | Freq: Once | INTRAVENOUS | Status: AC
  Administered 2022-03-21: 1000 mg via INTRAVENOUS
  Filled 2022-03-21: qty 50

## 2022-03-21 MED ORDER — NICOTINE 21 MG/24HR TD PT24
21.0000 mg | MEDICATED_PATCH | Freq: Every day | TRANSDERMAL | Status: DC
  Administered 2022-03-21 – 2022-03-30 (×10): 21 mg via TRANSDERMAL
  Filled 2022-03-21 (×11): qty 1

## 2022-03-21 MED ORDER — MECLIZINE HCL 12.5 MG PO TABS
25.0000 mg | ORAL_TABLET | Freq: Two times a day (BID) | ORAL | Status: DC | PRN
  Administered 2022-03-21: 25 mg via ORAL
  Filled 2022-03-21: qty 2

## 2022-03-21 MED ORDER — CALCIUM CARBONATE ANTACID 500 MG PO CHEW
800.0000 mg | CHEWABLE_TABLET | Freq: Every day | ORAL | Status: DC
  Administered 2022-03-21: 800 mg via ORAL
  Filled 2022-03-21: qty 4

## 2022-03-21 MED ORDER — CALCIUM CARBONATE ANTACID 500 MG PO CHEW
800.0000 mg | CHEWABLE_TABLET | Freq: Three times a day (TID) | ORAL | Status: DC
  Administered 2022-03-21 – 2022-03-30 (×25): 800 mg via ORAL
  Filled 2022-03-21 (×26): qty 4

## 2022-03-21 MED ORDER — MAGNESIUM SULFATE 2 GM/50ML IV SOLN
2.0000 g | Freq: Once | INTRAVENOUS | Status: AC
  Administered 2022-03-21: 2 g via INTRAVENOUS
  Filled 2022-03-21: qty 50

## 2022-03-21 NOTE — Plan of Care (Signed)
Pt alert and oriented x 4. Forgetful of events of yesterday from leaving with ems to arrival at hospital. Pt up 1 assist due to unsteady gait. CIWA 4. No ativan given. Pt flat affect noted. Vitals stable. Denies pain. No prns given.  Problem: Education: Goal: Knowledge of General Education information will improve Description: Including pain rating scale, medication(s)/side effects and non-pharmacologic comfort measures Outcome: Progressing   Problem: Health Behavior/Discharge Planning: Goal: Ability to manage health-related needs will improve Outcome: Progressing   Problem: Clinical Measurements: Goal: Ability to maintain clinical measurements within normal limits will improve Outcome: Progressing Goal: Will remain free from infection Outcome: Progressing Goal: Diagnostic test results will improve Outcome: Progressing Goal: Respiratory complications will improve Outcome: Progressing Goal: Cardiovascular complication will be avoided Outcome: Progressing   Problem: Activity: Goal: Risk for activity intolerance will decrease Outcome: Progressing   Problem: Nutrition: Goal: Adequate nutrition will be maintained Outcome: Progressing   Problem: Coping: Goal: Level of anxiety will decrease Outcome: Progressing   Problem: Elimination: Goal: Will not experience complications related to bowel motility Outcome: Progressing Goal: Will not experience complications related to urinary retention Outcome: Progressing   Problem: Pain Managment: Goal: General experience of comfort will improve Outcome: Progressing   Problem: Safety: Goal: Ability to remain free from injury will improve Outcome: Progressing   Problem: Skin Integrity: Goal: Risk for impaired skin integrity will decrease Outcome: Progressing

## 2022-03-21 NOTE — Progress Notes (Signed)
PROGRESS NOTE   Eric Conley  OVZ:[id number] DOB: Feb 11, 1961 DOA: 03/20/2022 PCP: Sharilyn Sites, MD   Chief Complaint  Patient presents with   Seizures   Level of care: Telemetry  Brief Admission History:  61 y.o. male with medical history significant of acid reflux, depression, hypertension, alcohol dependence, subdural hematoma from 6 weeks ago, and more presents the ED with a chief complaint of seizure.  Patient gives history that he has given to ED staff.  To me, patient reports he came into the ED because he was lightheaded and hungry.  He reports that he is lightheaded probably because he was hungry.  His last normal meal was 2 or 3 days ago.  He has had diarrhea in the interval.  His diarrhea is nonbloody and happens 3 times a day.  He has no associated abdominal pain.  He blames some [place] takeout food for his diarrhea.  He reports that the lightheadedness happen when he woke up.  He reports that it was present before he had any postural changes.  He reports that his hands were shaky because he had not eaten.  He reports no loss of consciousness, no loss of time, no falls.  Patient reports that he used to have a history of alcohol dependence but does not anymore.  He reports that his last drink was yesterday.  It was 1 beer.  When asked him that she normally drinks he will not give an answer.  He does report that he only drinks on days when he does not have to work.  When it is brought to his attention that he is has not had to work in several weeks because of his recent trauma, he reports that he has not been drinking has been in the hospital.   In the ED he reported that he had woken up this morning feeling tingly and having intermittent numbness to his left arm.  He reports he woke up with the symptoms today.  Did not get any better throughout the day.  EMS was called and in route to the hospital patient had witnessed tonic-clonic activity which lasted for about a minute.  2-1/2 mg of  Versed was given which aborted the tonic-clonic activity.  Patient was postictal on arrival to the hospital.  Patient does not remember any of this.   Patient does drink and does smoke as discussed above.  He does not use illicit drugs.  He is vaccinated for COVID.  Patient is full code.   Assessment and Plan: * Seizure ([place]) - Most likely from intracranial bleed  -Hypomagnesemia likely also contributing - Initially thought to be posttraumatic seizure given patient's history, and was treated with Keppra - Neuro consulted and recommending keppra 500 mg BID  - Continue CIWA protocol for alcohol withdrawal  -Continue to monitor and replete magnesium - Seizure precautions - Continue to monitor  Wrist pain - Patient reports right wrist is painful - Slightly swollen compared to left wrist - Patient does not remember injuring it, so the possibility that it was her during seizure activity - X-ray with no acute fractures found, wrist splint ordered for comfort.  - Pain control ordered  Tobacco use disorder - Patient reports he smokes 2 or 3 cigarettes a day - nicotine patch ordered  - Counseled on importance of cessation  GERD (gastroesophageal reflux disease) - Continue PPI  Hypocalcemia - Calcium 6.1 -Corrects to 6.6 for albumin - Replaced with 1 g of calcium gluconate and tums oral TID,  recheck in AM   Hypomagnesemia - admission Magnesium is undetectable at less than 0.5 -Secondary to poor p.o. intake and GI losses - repleted  - Recheck in AM   Hypokalemia - Potassium is 2.6 - Secondary to poor p.o. intake and GI losses - continue to replace, recheck in AM   Diarrhea - 2 days of nonbloody diarrhea reported - Likely contributing to electrolyte abnormalities - The patient continues to have diarrhea here we will get stool studies - No abdominal pain associated - Continue to monitor  Alcohol use disorder, severe, dependence (HCC) - Family reports that patient has been a  heavy drinker - Patient will not quantify how much he drinks - He does say his last drink was yesterday and he had 1 beer - He reports he only drinks on days that he is not working, but he has been out of work due to his recent trauma with subdural hematoma - Positive for asterixis - CIWA protocol - Most likely patient's seizure was related to alcohol withdrawal - Continue to monitor  DVT prophylaxis: SCDs Code Status: Full  Family Communication: bedside update 10/16 Disposition: Status is: Inpatient Remains inpatient appropriate because: intensity    Consultants:  Neurology  Procedures:   Antimicrobials:    Subjective: Pt reports he doesn't remember what happened, why is he here? Objective: Vitals:   03/21/22 0007 03/21/22 0411 03/21/22 0815 03/21/22 1202  BP: 103/73 105/70 114/70 112/71  Pulse: 85 86 85 86  Resp: 19 14 18 20   Temp: 98.2 F (36.8 C) 98.3 F (36.8 C) 98.3 F (36.8 C) 98.3 F (36.8 C)  TempSrc: Oral Oral Oral Oral  SpO2: 97% 98% 98% 100%  Weight:      Height:        Intake/Output Summary (Last 24 hours) at 03/21/2022 1606 Last data filed at 03/21/2022 1500 Gross per 24 hour  Intake 469.98 ml  Output --  Net 469.98 ml   Filed Weights   03/20/22 2338  Weight: 64.8 kg   Examination:  General exam: Appears calm and comfortable  Respiratory system: Clear to auscultation. Respiratory effort normal. Cardiovascular system: normal S1 & S2 heard. No JVD, murmurs, rubs, gallops or clicks. No pedal edema. Gastrointestinal system: Abdomen is nondistended, soft and nontender. No organomegaly or masses felt. Normal bowel sounds heard. Central nervous system: Alert and oriented. No focal neurological deficits. Extremities: Symmetric 5 x 5 power. Skin: No rashes, lesions or ulcers. Psychiatry: Judgement and insight appear normal. Mood & affect appropriate.   Data Reviewed: I have personally reviewed following labs and imaging studies  CBC: Recent Labs   Lab 03/20/22 1841 03/21/22 0516  WBC 5.2 3.7*  NEUTROABS 3.7 2.4  HGB 13.7 13.1  HCT 39.7 38.1*  MCV 97.3 99.2  PLT 208 202    Basic Metabolic Panel: Recent Labs  Lab 03/20/22 1841 03/20/22 2356 03/21/22 0516  NA 134* 137 138  K 2.8* 2.7* 2.6*  CL 98 104 107  CO2 26 22 22   GLUCOSE 107* 106* 123*  BUN <5* <5* <5*  CREATININE 0.89 0.73 0.60*  CALCIUM 6.1* 6.4* 6.9*  MG <0.5* 1.4* 2.4    CBG: Recent Labs  Lab 03/20/22 1749 03/20/22 1852  GLUCAP 133* 115*    No results found for this or any previous visit (from the past 240 hour(s)).   Radiology Studies: DG Wrist 2 Views Right  Result Date: 03/20/2022 CLINICAL DATA:  Wrist pain. EXAM: RIGHT WRIST - 2 VIEW COMPARISON:  None Available.  FINDINGS: There is soft tissue swelling surrounding the wrist. There is no acute fracture or dislocation. There is mild radiocarpal joint space narrowing. IMPRESSION: 1. No acute fracture or dislocation. 2. Soft tissue swelling surrounding the wrist. Electronically Signed   By: Darliss Cheney M.D.   On: 03/20/2022 23:04   CT HEAD WO CONTRAST  Result Date: 03/20/2022 CLINICAL DATA:  New onset seizure, recent trauma with subdural hematoma. EXAM: CT HEAD WITHOUT CONTRAST TECHNIQUE: Contiguous axial images were obtained from the base of the skull through the vertex without intravenous contrast. RADIATION DOSE REDUCTION: This exam was performed according to the departmental dose-optimization program which includes automated exposure control, adjustment of the mA and/or kV according to patient size and/or use of iterative reconstruction technique. COMPARISON:  01/25/2022 FINDINGS: Brain: No evidence of acute infarction, hydrocephalus, extra-axial collection or mass lesion/mass effect. Trace residual left parietal subdural hematoma (coronal image 52), measuring 5 mm in thickness, previously 19 mm. Encephalomalacic changes in the left frontal lobe (series 2/image 11). Prior left frontal contusion with  subarachnoid hemorrhage has resolved. Subcortical white matter and periventricular small vessel ischemic changes. Vascular: Intracranial atherosclerosis. Skull: Normal. Negative for fracture or focal lesion. Sinuses/Orbits: Minimal partial opacification of the bilateral maxillary sinuses. Mastoid air cells are clear. Other: None. IMPRESSION: Trace residual left parietal subdural hematoma, as above. Encephalomalacic changes in the left frontal lobe at the site of prior left frontal contusion. Small vessel ischemic changes. Electronically Signed   By: Charline Bills M.D.   On: 03/20/2022 18:13    Scheduled Meds:  calcium carbonate  800 mg of elemental calcium Oral TID WC   folic acid  1 mg Oral Daily   levETIRAcetam  500 mg Oral BID   LORazepam  0-4 mg Intravenous Q6H   Followed by   Melene Muller ON 03/22/2022] LORazepam  0-4 mg Intravenous Q12H   multivitamin with minerals  1 tablet Oral Once   nicotine  21 mg Transdermal Daily   pantoprazole  40 mg Oral Daily   thiamine  100 mg Oral Daily   Or   thiamine  100 mg Intravenous Daily   Continuous Infusions:  dextrose 5% lactated ringers with KCl 20 mEq/L 100 mL/hr at 03/20/22 2335     LOS: 1 day   Time spent: 35 mins  Hiro Vipond Laural Benes, MD How to contact the [facility] Attending or Consulting provider 7A - 7P or covering provider during after hours 7P -7A, for this patient?  Check the care team in [facility] and look for a) attending/consulting TRH provider listed and b) the [facility] team listed Log into [email or web address] and use [place]'s universal password to access. If you do not have the password, please contact the hospital operator. Locate the [facility] provider you are looking for under [facility] and page to a number that you can be directly reached. If you still have difficulty reaching the provider, please page the [facility] (Director on Call) for the Hospitalists listed on amion for assistance.  03/21/2022, 4:06 PM

## 2022-03-21 NOTE — Consult Note (Signed)
I connected with  Eric Conley on 03/21/22 by a video enabled telemedicine application and verified that I am speaking with the correct person using two identifiers.   I discussed the limitations of evaluation and management by telemedicine. The patient expressed understanding and agreed to proceed.  Location of patient: [facility] Location of physician: [facility]   Neurology Consultation Reason for Consult: Seizure Referring Physician: Dr Eric Conley  CC: Seizure  History is obtained from: Patient, chart review  HPI: Eric Conley is a 61 y.o. male with past medical history of subdural hematoma, alcohol use disorder, hypertension who was brought in by EMS for seizure-like activity.   Patient reports feeling off balance for the last couple of days as well as feeling and numbness in bilateral upper and lower extremity which has been going on for months but appears to be worse in the last couple of days.  States initially he was feeling off balance only when walking but right now even while laying in bed if he quickly turns his head he feels dizzy, denies known vision or double vision.  Per ED note, patient had a generalized tonic-clonic seizure in route which is after about 1 minute and he became apneic/comfortable for about 45 seconds and he received 2.5 mg of IV Versed.  Patient states he drinks about 2 beers daily.  Denies ever having a seizure before.  Denies recent illness, new medications.  Of note, patient also reports taking Valium 5 mg at bedtime for sleep and has been on it for years, denies missing any doses.  ROS: All other systems reviewed and negative except as noted in the HPI.    Past Medical History:  Diagnosis Date   Acid reflux    Depression    Hypertension     Social History:  reports that he has been smoking cigarettes. He has been smoking an average of .25 packs per day. He has never used smokeless tobacco. He reports current alcohol use of  about 7.0 standard drinks of alcohol per week. He reports that he does not currently use drugs.  Medications Prior to Admission  Medication Sig Dispense Refill Last Dose   diazepam (VALIUM) 10 MG tablet Take 10 mg by mouth 2 (two) times daily.   03/20/2022   Esomeprazole Magnesium (NEXIUM PO) Take 1 tablet by mouth daily as needed (indigestion).   03/20/2022   omeprazole (PRILOSEC) 20 MG capsule Take 20 mg by mouth daily as needed (indigestion). Alternates with nexium   Past Week   Potassium (POTASSIMIN PO) Take 1 tablet by mouth in the morning and at bedtime.   03/20/2022   docusate sodium (COLACE) 100 MG capsule Take 1 capsule (100 mg total) by mouth 2 (two) times daily. 10 capsule 0 Unknown   HYDROcodone-acetaminophen (NORCO/VICODIN) 5-325 MG tablet Take 1-2 tablets by mouth every 4 (four) hours as needed for moderate pain. 30 tablet 0 Unknown      Exam: Current vital signs: BP 114/70 (BP Location: Left Arm)   Pulse 85   Temp 98.3 F (36.8 C) (Oral)   Resp 18   Ht 5\' 9"  (1.753 m)   Wt 64.8 kg   SpO2 98%   BMI 21.10 kg/m  Vital signs in last 24 hours: Temp:  [97.3 F (36.3 C)-98.9 F (37.2 C)] 98.3 F (36.8 C) (10/16 0815) Pulse Rate:  [85-122] 85 (10/16 0815) Resp:  [14-25] 18 (10/16 0815) BP: (103-128)/(70-92) 114/70 (10/16 0815) SpO2:  [92 %-100 %]  98 % (10/16 0815) Weight:  [64.8 kg] 64.8 kg (10/15 2338)   Physical Exam  Constitutional: Appears well-developed and well-nourished.  Psych: Affect appropriate to situation Neuro: AOx3, cranial nerves II to XII appear grossly intact, antigravity strength in all 4 extremities without drift, FTN intact bilaterally, decree sensation to light touch in bilateral lower extremities per patient (exam limited due to tele evaluation)   I have reviewed labs in epic and the results pertinent to this consultation are: CBC:  Recent Labs  Lab 03/20/22 1841 03/21/22 0516  WBC 5.2 3.7*  NEUTROABS 3.7 2.4  HGB 13.7 13.1  HCT 39.7  38.1*  MCV 97.3 99.2  PLT 208 202    Basic Metabolic Panel:  Lab Results  Component Value Date   NA 138 03/21/2022   K 2.6 (LL) 03/21/2022   CO2 22 03/21/2022   GLUCOSE 123 (H) 03/21/2022   BUN <5 (L) 03/21/2022   CREATININE 0.60 (L) 03/21/2022   CALCIUM 6.9 (L) 03/21/2022   GFRNONAA >60 03/21/2022   GFRAA >60 12/06/2019   Lipid Panel: No results found for: "LDLCALC" HgbA1c: No results found for: "HGBA1C" Urine Drug Screen:     Component Value Date/Time   LABOPIA NONE DETECTED 03/20/2022 2047   COCAINSCRNUR NONE DETECTED 03/20/2022 2047   LABBENZ POSITIVE (A) 03/20/2022 2047   AMPHETMU NONE DETECTED 03/20/2022 2047   THCU NONE DETECTED 03/20/2022 2047   LABBARB NONE DETECTED 03/20/2022 2047    Alcohol Level     Component Value Date/Time   ETH <10 03/20/2022 1841     I have reviewed the images obtained:  CT Head without contrast 03/20/2022: Trace residual left parietal subdural hematoma, as above. Encephalomalacic changes in the left frontal lobe at the site of prior left frontal contusion. Small vessel ischemic changes.    CT head without contrast 01/25/2022: Extra-axial hemorrhage at the left parietal convexity measuring up to 19 mm in thickness with local cortical mass effect, unchanged. Hemorrhagic contusion in the anterior left frontal and temporal lobe without progression. Trace subarachnoid hemorrhage along the contusions and right parasagittal frontal region. No midline shift or hydrocephalus.     ASSESSMENT/PLAN: 61 year old male with history of alcohol use disorder, left parietal extra-axial hemorrhage and hemorrhagic contusions in left frontal and temporal lobe as well as subarachnoid hemorrhage and right parasagittal frontal region in August who presented with seizure.   New onset seizures -Patient's alcohol level on arrival was less than 10.  He does have history of significant alcohol use.  Also uses benzodiazepines and denies missing any doses.   UDS was positive on arrival but he was also given IV Versed for seizure  Recommendations: -Due to intracranial hemorrhage, patient is at an increased risk of seizure.  Therefore recommend starting Keppra 500 mg twice daily and continuing at the time of discharge -We will obtain MRI brain without contrast to look for any acute abnormality -Discussed seizure precautions including do not drive for 6 months  Paresthesias Dizziness -Could be secondary to alcohol induced neuropathy versus spinal stenosis.  Focal sensory seizure could have similar presentation but unlikely to be diffuse involving all 4 extremities  Recommendations -We will obtain MRI C and T-spine without contrast to look for any stenosis -If within normal limits, recommend outpatient EMG/NCS  - Discussed plan with Dr Laural Benes   Seizure precautions: Per [facility] statutes, patients with seizures are not allowed to drive until they have been seizure-free for six months and cleared by a physician  Use caution when using heavy equipment or power tools. Avoid working on ladders or at heights. Take showers instead of baths. Ensure the water temperature is not too high on the home water heater. Do not go swimming alone. Do not lock yourself in a room alone (i.e. bathroom). When caring for infants or small children, sit down when holding, feeding, or changing them to minimize risk of injury to the child in the event you have a seizure. Maintain good sleep hygiene. Avoid alcohol.    If patient has another seizure, call 911 and bring them back to the ED if: A.  The seizure lasts longer than 5 minutes.      B.  The patient doesn't wake shortly after the seizure or has new problems such as difficulty seeing, speaking or moving following the seizure C.  The patient was injured during the seizure D.  The patient has a temperature over 102 F (39C) E.  The patient vomited during the seizure and now is having trouble breathing     During the Seizure   - First, ensure adequate ventilation and place patients on the floor on their left side  Loosen clothing around the neck and ensure the airway is patent. If the patient is clenching the teeth, do not force the mouth open with any object as this can cause severe damage - Remove all items from the surrounding that can be hazardous. The patient may be oblivious to what's happening and may not even know what he or she is doing. If the patient is confused and wandering, either gently guide him/her away and block access to outside areas - Reassure the individual and be comforting - Call 911. In most cases, the seizure ends before EMS arrives. However, there are cases when seizures may last over 3 to 5 minutes. Or the individual may have developed breathing difficulties or severe injuries. If a pregnant patient or a person with diabetes develops a seizure, it is prudent to call an ambulance. - Finally, if the patient does not regain full consciousness, then call EMS. Most patients will remain confused for about 45 to 90 minutes after a seizure, so you must use judgment in calling for help.     After the Seizure (Postictal Stage)   After a seizure, most patients experience confusion, fatigue, muscle pain and/or a headache. Thus, one should permit the individual to sleep. For the next few days, reassurance is essential. Being calm and helping reorient the person is also of importance.   Most seizures are painless and end spontaneously. Seizures are not harmful to others but can lead to complications such as stress on the lungs, brain and the heart. Individuals with prior lung problems may develop labored breathing and respiratory distress.   Thank you for allowing [place] to participate in the care of this patient. If you have any further questions, please contact  me or neurohospitalist.   Zeb Comfort Epilepsy [facility]

## 2022-03-21 NOTE — Progress Notes (Addendum)
Date and time results received: 03/21/22 0055   Test: potassium  Critical Value: 2.7  Name of Provider Notified: A. Gosh 03/21/22 @ 0125  Orders Received? Or Actions Taken?: Orders Received - See Orders for details Test:  calcium Critical Value: 6.4  Name of Provider Notified: A. Gosh 03/21/22 @ 0125  Orders Received? Or Actions Taken?: Orders Received - See Orders for details

## 2022-03-21 NOTE — Progress Notes (Signed)
  Transition of Care [facility]) Screening Note   Patient Details  Name: Eric Conley Date of Birth: [birth year]   Transition of Care [facility]) CM/SW Contact:    Iona Beard, [place] Phone Number: 03/21/2022, 1:39 PM  TOC consulted for substance use education. CSW spoke with pt to inquire about interest in substance use resources. Pt states he does not need resources at this time. CSW explained to update treatment team if anything changes.   Transition of Care Department [facility]) has reviewed patient and no TOC needs have been identified at this time. We will continue to monitor patient advancement through interdisciplinary progression rounds. If new patient transition needs arise, please place a TOC consult.

## 2022-03-21 NOTE — Progress Notes (Addendum)
Date and time results received: 03/21/22 0645 (use smartphrase ".now" to insert current time)  Test: potassium Critical Value: 2.6  Name of Provider Notified: Dr. Clearence Ped  03/21/22 0651  Orders Received? Or Actions Taken?: Orders Received - See Orders for details

## 2022-03-21 NOTE — Hospital Course (Addendum)
61 y.o. male with medical history significant of acid reflux, depression, hypertension, alcohol dependence, subdural hematoma and ,left parietal extra-axial hemorrhage and hemorrhagic contusions in left frontal and temporal lobe in Aug 2023 presents the ED with a chief complaint of seizure.  Patient gives history that he has given to ED staff.  To me, patient reports he came into the ED because he was lightheaded and hungry.  He reports that he is lightheaded probably because he was hungry.  He has had diarrhea in the interval.  His diarrhea is nonbloody and happens 3 times a day.  He has no associated abdominal pain.  He blames some [place] takeout food for his diarrhea.  He reports that the lightheadedness happen when he woke up.  He reports that it was present before he had any postural changes.  He reports that his hands were shaky because he had not eaten.  He reports no loss of consciousness, no loss of time, no falls.  Patient reports that he used to have a history of alcohol dependence but does not anymore.  He reports that his last drink was yesterday.  It was 1 beer.  When asked him that she normally drinks he will not give an answer.  He does report that he only drinks on days when he does not have to work.  When it is brought to his attention that he is has not had to work in several weeks because of his recent trauma, he reports that he has not been drinking has been in the hospital.   In the ED he reported that he had woken up this morning feeling tingly and having intermittent numbness to his left arm.  He reports he woke up with the symptoms today.  Did not get any better throughout the day.  EMS was called and in route to the hospital patient had witnessed tonic-clonic activity which lasted for about a minute.  2-1/2 mg of Versed was given which aborted the tonic-clonic activity.  Patient was postictal on arrival to the hospital.  Patient does not remember any of this.   Patient does drink and  does smoke as discussed above.  He does not use illicit drugs.  He is vaccinated for COVID.  Patient is full code.  Neurology was consulted and patient was started on Keppra.  He did not have any further seizures.  Unfortunately, the patient began developing alcohol withdrawal manifested by increasing agitation.  He was transferred to the ICU and placed on Precedex in the early morning 03/23/2022.  On the evening 10/19-10/20, patient developed fever and SVT.  This prolonged his hospitalization.  He was started on metoprolol and electrolytes were optimized. Metoprolol was gradually titrated up to optimize HR.  He remained hemodynamically stable.  During the hospitalization, right forearm noted to develop erythema and edema.  Pt was started on cefazolin.  It did not improve much so MR of forearm was ordered and pt was switched to vancomycin.  This has resulted in prolonged hospitalization.

## 2022-03-22 ENCOUNTER — Encounter (HOSPITAL_COMMUNITY): Payer: Self-pay | Admitting: Family Medicine

## 2022-03-22 DIAGNOSIS — F1020 Alcohol dependence, uncomplicated: Secondary | ICD-10-CM | POA: Diagnosis not present

## 2022-03-22 DIAGNOSIS — R569 Unspecified convulsions: Secondary | ICD-10-CM | POA: Diagnosis not present

## 2022-03-22 DIAGNOSIS — K219 Gastro-esophageal reflux disease without esophagitis: Secondary | ICD-10-CM | POA: Diagnosis not present

## 2022-03-22 LAB — RENAL FUNCTION PANEL
Albumin: 3 g/dL — ABNORMAL LOW (ref 3.5–5.0)
Anion gap: 8 (ref 5–15)
BUN: <5 mg/dL — ABNORMAL LOW (ref 6–20)
CO2: 22 mmol/L (ref 22–32)
Calcium: 8.2 mg/dL — ABNORMAL LOW (ref 8.9–10.3)
Chloride: 106 mmol/L (ref 98–111)
Creatinine, Ser: 0.6 mg/dL — ABNORMAL LOW (ref 0.61–1.24)
GFR, Estimated: >60 mL/min (ref >60)
Glucose, Bld: 106 mg/dL — ABNORMAL HIGH (ref 70–99)
Phosphorus: 2.7 mg/dL (ref 2.5–4.6)
Potassium: 3.5 mmol/L (ref 3.5–5.1)
Sodium: 136 mmol/L (ref 135–145)

## 2022-03-22 MED ORDER — HALOPERIDOL LACTATE 5 MG/ML IJ SOLN
5.0000 mg | Freq: Four times a day (QID) | INTRAMUSCULAR | Status: DC | PRN
  Administered 2022-03-22 – 2022-03-29 (×3): 5 mg via INTRAVENOUS
  Filled 2022-03-22 (×3): qty 1

## 2022-03-22 MED ORDER — CHLORHEXIDINE GLUCONATE CLOTH 2 % EX PADS
6.0000 | MEDICATED_PAD | Freq: Every day | CUTANEOUS | Status: DC
  Administered 2022-03-23 – 2022-03-31 (×9): 6 via TOPICAL

## 2022-03-22 MED ORDER — CHLORDIAZEPOXIDE HCL 5 MG PO CAPS
10.0000 mg | ORAL_CAPSULE | Freq: Three times a day (TID) | ORAL | Status: DC
  Administered 2022-03-22 (×3): 10 mg via ORAL
  Filled 2022-03-22 (×3): qty 2

## 2022-03-22 MED ORDER — DEXMEDETOMIDINE HCL IN NACL 400 MCG/100ML IV SOLN
0.4000 ug/kg/h | INTRAVENOUS | Status: DC
  Administered 2022-03-23: 0.5 ug/kg/h via INTRAVENOUS
  Administered 2022-03-23: 0.4 ug/kg/h via INTRAVENOUS
  Filled 2022-03-22 (×4): qty 100

## 2022-03-22 NOTE — Progress Notes (Signed)
PROGRESS NOTE   Eric Conley  [id number] DOB: 27-Jun-1960 DOA: 03/20/2022 PCP: Sharilyn Sites, MD   Chief Complaint  Patient presents with   Seizures   Level of care: Telemetry  Brief Admission History:  61 y.o. male with medical history significant of acid reflux, depression, hypertension, alcohol dependence, subdural hematoma from 6 weeks ago, and more presents the ED with a chief complaint of seizure.  Patient gives history that he has given to ED staff.  To me, patient reports he came into the ED because he was lightheaded and hungry.  He reports that he is lightheaded probably because he was hungry.  His last normal meal was 2 or 3 days ago.  He has had diarrhea in the interval.  His diarrhea is nonbloody and happens 3 times a day.  He has no associated abdominal pain.  He blames some [place] takeout food for his diarrhea.  He reports that the lightheadedness happen when he woke up.  He reports that it was present before he had any postural changes.  He reports that his hands were shaky because he had not eaten.  He reports no loss of consciousness, no loss of time, no falls.  Patient reports that he used to have a history of alcohol dependence but does not anymore.  He reports that his last drink was yesterday.  It was 1 beer.  When asked him that she normally drinks he will not give an answer.  He does report that he only drinks on days when he does not have to work.  When it is brought to his attention that he is has not had to work in several weeks because of his recent trauma, he reports that he has not been drinking has been in the hospital.   In the ED he reported that he had woken up this morning feeling tingly and having intermittent numbness to his left arm.  He reports he woke up with the symptoms today.  Did not get any better throughout the day.  EMS was called and in route to the hospital patient had witnessed tonic-clonic activity which lasted for about a minute.  2-1/2 mg of  Versed was given which aborted the tonic-clonic activity.  Patient was postictal on arrival to the hospital.  Patient does not remember any of this.   Patient does drink and does smoke as discussed above.  He does not use illicit drugs.  He is vaccinated for COVID.  Patient is full code.   Assessment and Plan: * Seizure ([place]) - Most likely from intracranial bleed  -Hypomagnesemia likely also contributing - Initially thought to be posttraumatic seizure given patient's history, and was treated with Keppra - Neuro consulted and recommending keppra 500 mg BID  - Continue CIWA protocol for alcohol withdrawal  -Continue to monitor and replete magnesium - Seizure precautions - Continue to monitor  Wrist pain - Patient reports right wrist is painful - Slightly swollen compared to left wrist - Patient does not remember injuring it, so the possibility that it was her during seizure activity - X-ray with no acute fractures found, wrist splint ordered for comfort.  - Pain control ordered  Tobacco use disorder - Patient reports he smokes 2 or 3 cigarettes a day - nicotine patch ordered  - Counseled on importance of cessation  GERD (gastroesophageal reflux disease) - Continue PPI  Hypocalcemia - Calcium 6.1 --> improved to 8.2 -Corrects to 6.6 for albumin - Replaced with 1 g of calcium gluconate  and tums oral TID, recheck in AM   Hypomagnesemia - admission Magnesium is undetectable at less than 0.5 -Secondary to poor p.o. intake and GI losses - repleted   Hypokalemia - Potassium is 2.6 - Secondary to poor p.o. intake and GI losses - continue to replace, recheck in AM, repleted   Diarrhea - 2 days of nonbloody diarrhea reported - Likely contributing to electrolyte abnormalities - The patient continues to have diarrhea here we will get stool studies - No abdominal pain associated - Continue to monitor -- seems to be resolved now  Alcohol use disorder, severe, dependence ([place]) -  Family reports that patient has been a heavy drinker - Patient will not quantify how much he drinks - He does say his last drink was yesterday and he had 1 beer - He reports he only drinks on days that he is not working, but he has been out of work due to his recent trauma with subdural hematoma - Positive for asterixis - CIWA protocol - He is getting more agitated and CIWA up to 14 this morning -- added librium 10 mg TID, continue CIWA, sitter, if worsens will transfer to stepdown ICU and start precedex infusion  DVT prophylaxis: SCDs Code Status: Full  Family Communication: bedside update 10/16 Disposition: Status is: Inpatient Remains inpatient appropriate because: intensity    Consultants:  Neurology  Procedures:   Antimicrobials:    Subjective: Pt having less right wrist pain with splint applied, he is confused, but can be re-oriented quickly Objective: Vitals:   03/21/22 1202 03/21/22 2017 03/22/22 0307 03/22/22 0515  BP: 112/71 129/82 131/88 (!) 140/79  Pulse: 86 91 90 87  Resp: 20 20 20 18   Temp: 98.3 F (36.8 C) 98.6 F (37 C) 98.2 F (36.8 C) 98.2 F (36.8 C)  TempSrc: Oral  Oral Oral  SpO2: 100% 97% 99% 97%  Weight:      Height:        Intake/Output Summary (Last 24 hours) at 03/22/2022 1308 Last data filed at 03/22/2022 1000 Gross per 24 hour  Intake 533.68 ml  Output 1000 ml  Net -466.32 ml   Filed Weights   03/20/22 2338  Weight: 64.8 kg   Examination:  General exam: Appears calm and comfortable, he is confused and getting out of bed  Respiratory system: Clear to auscultation. Respiratory effort normal. Cardiovascular system: normal S1 & S2 heard. No JVD, murmurs, rubs, gallops or clicks. No pedal edema. Gastrointestinal system: Abdomen is nondistended, soft and nontender. No organomegaly or masses felt. Normal bowel sounds heard. Central nervous system: Alert and oriented. No focal neurological deficits. Extremities: right wrist in velcro  splint. Symmetric 5 x 5 power. Skin: chronic dermatitis rashes both forearms.  Psychiatry: Judgement and insight appear confused. Mood & affect appropriate.   Data Reviewed: I have personally reviewed following labs and imaging studies  CBC: Recent Labs  Lab 03/20/22 1841 03/21/22 0516  WBC 5.2 3.7*  NEUTROABS 3.7 2.4  HGB 13.7 13.1  HCT 39.7 38.1*  MCV 97.3 99.2  PLT 208 [id number]    Basic Metabolic Panel: Recent Labs  Lab 03/20/22 1841 03/20/22 2356 03/21/22 0516 03/22/22 0503  NA 134* 137 138 136  K 2.8* 2.7* 2.6* 3.5  CL 98 104 107 106  CO2 26 22 22 22   GLUCOSE 107* 106* 123* 106*  BUN <5* <5* <5* <5*  CREATININE 0.89 0.73 0.60* 0.60*  CALCIUM 6.1* 6.4* 6.9* 8.2*  MG <0.5* 1.4* 2.4  --  PHOS  --   --   --  2.7    CBG: Recent Labs  Lab 03/20/22 1749 03/20/22 1852  GLUCAP 133* 115*    No results found for this or any previous visit (from the past 240 hour(s)).   Radiology Studies: MR CERVICAL SPINE WO CONTRAST  Result Date: 03/21/2022 CLINICAL DATA:  Paresthesias, dizziness, cervical radiculopathy EXAM: MRI CERVICAL AND THORACIC SPINE WITHOUT CONTRAST TECHNIQUE: Multiplanar and multiecho pulse sequences of the cervical spine, to include the craniocervical junction and cervicothoracic junction, and the thoracic spine, were obtained without intravenous contrast. COMPARISON:  No prior MRI of the cervical or thoracic spine, correlation is made with 01/24/2022 CT cervical spine FINDINGS: MRI CERVICAL SPINE FINDINGS Evaluation is somewhat limited by motion artifact. Alignment: No listhesis. Vertebrae: No fracture, evidence of discitis, or bone lesion. Cord: Normal signal and morphology. Posterior Fossa, vertebral arteries, paraspinal tissues: Negative. Disc levels: C2-C3: No significant disc bulge. No spinal canal stenosis or neuroforaminal narrowing. C3-C4: No significant disc bulge. No spinal canal stenosis or neuroforaminal narrowing. C4-C5: Disc height loss and mild  disc bulge. Facet and uncovertebral hypertrophy, right-greater-than-left. No spinal canal stenosis. Mild-to-moderate right neural foraminal narrowing. C5-C6: Disc height loss and mild disc bulge. Facet and uncovertebral hypertrophy. No spinal canal stenosis. Mild bilateral neural foraminal narrowing. C6-C7: Disc height loss and mild disc bulge with disc osteophyte complex. Facet and uncovertebral hypertrophy. No spinal canal stenosis. Mild bilateral neural foraminal narrowing. C7-T1: No significant disc bulge. No spinal canal stenosis or neuroforaminal narrowing. MRI THORACIC SPINE FINDINGS Alignment: No listhesis. Preservation of the normal thoracic kyphosis. Vertebrae: No fracture, evidence of discitis, or bone lesion. Cord:  Normal signal and morphology. Paraspinal and other soft tissues: Trace pleural effusions. Disc levels: No significant spinal canal stenosis or neural foraminal narrowing. IMPRESSION: 1. C4-C5 mild-to-moderate right neural foraminal narrowing. 2. C5-C6 and C6-C7 mild bilateral neural foraminal narrowing. 3. No spinal canal stenosis in the cervical spine. 4. No significant spinal canal stenosis or neural foraminal narrowing in the thoracic spine. 5. Trace pleural effusions. Electronically Signed   By: Merilyn Baba M.D.   On: 03/21/2022 18:44   MR THORACIC SPINE WO CONTRAST  Result Date: 03/21/2022 CLINICAL DATA:  Paresthesias, dizziness, cervical radiculopathy EXAM: MRI CERVICAL AND THORACIC SPINE WITHOUT CONTRAST TECHNIQUE: Multiplanar and multiecho pulse sequences of the cervical spine, to include the craniocervical junction and cervicothoracic junction, and the thoracic spine, were obtained without intravenous contrast. COMPARISON:  No prior MRI of the cervical or thoracic spine, correlation is made with 01/24/2022 CT cervical spine FINDINGS: MRI CERVICAL SPINE FINDINGS Evaluation is somewhat limited by motion artifact. Alignment: No listhesis. Vertebrae: No fracture, evidence of  discitis, or bone lesion. Cord: Normal signal and morphology. Posterior Fossa, vertebral arteries, paraspinal tissues: Negative. Disc levels: C2-C3: No significant disc bulge. No spinal canal stenosis or neuroforaminal narrowing. C3-C4: No significant disc bulge. No spinal canal stenosis or neuroforaminal narrowing. C4-C5: Disc height loss and mild disc bulge. Facet and uncovertebral hypertrophy, right-greater-than-left. No spinal canal stenosis. Mild-to-moderate right neural foraminal narrowing. C5-C6: Disc height loss and mild disc bulge. Facet and uncovertebral hypertrophy. No spinal canal stenosis. Mild bilateral neural foraminal narrowing. C6-C7: Disc height loss and mild disc bulge with disc osteophyte complex. Facet and uncovertebral hypertrophy. No spinal canal stenosis. Mild bilateral neural foraminal narrowing. C7-T1: No significant disc bulge. No spinal canal stenosis or neuroforaminal narrowing. MRI THORACIC SPINE FINDINGS Alignment: No listhesis. Preservation of the normal thoracic kyphosis. Vertebrae: No fracture, evidence of discitis,  or bone lesion. Cord:  Normal signal and morphology. Paraspinal and other soft tissues: Trace pleural effusions. Disc levels: No significant spinal canal stenosis or neural foraminal narrowing. IMPRESSION: 1. C4-C5 mild-to-moderate right neural foraminal narrowing. 2. C5-C6 and C6-C7 mild bilateral neural foraminal narrowing. 3. No spinal canal stenosis in the cervical spine. 4. No significant spinal canal stenosis or neural foraminal narrowing in the thoracic spine. 5. Trace pleural effusions. Electronically Signed   By: Merilyn Baba M.D.   On: 03/21/2022 18:44   MR BRAIN WO CONTRAST  Result Date: 03/21/2022 CLINICAL DATA:  Paresthesias, dizziness, could be secondary to fall induced neuropathy or spinal stenosis. Concern for seizure. EXAM: MRI HEAD WITHOUT CONTRAST TECHNIQUE: Multiplanar, multiecho pulse sequences of the brain and surrounding structures were  obtained without intravenous contrast. COMPARISON:  No prior MRI, correlation is made with head CT 03/20/2022 FINDINGS: Evaluation is somewhat limited by motion artifact. Brain: No restricted diffusion to suggest acute or subacute infarct. No acute hemorrhage, mass, mass effect, or midline shift. No hydrocephalus. 5 mm residual left parietal subdural hematoma (series 18, image 7), as seen on the recent CT. No new extra-axial collection. Remote right posterior lentiform nucleus lacunar infarct. Encephalomalacia in the left anteromedial temporal lobe (series 15, image 12) and left inferior frontal lobe (series 15, images 19 and 22), likely the sequela of prior trauma. These areas are associated with hemosiderin deposition. The hippocampi are symmetric in size and signal. No heterotopia or evidence of cortical dysgenesis. Vascular: Normal arterial flow voids. Skull and upper cervical spine: Normal marrow signal. Sinuses/Orbits: Mucosal thickening in the maxillary sinuses and ethmoid air cells. The orbits are unremarkable. Other: Trace fluid in left mastoid air cells. IMPRESSION: 1. No acute intracranial process. No acute or subacute infarct. No seizure etiology identified. 2. 5 mm residual left parietal subdural hematoma, as seen on the recent CT. 3. Encephalomalacia in the left anteromedial temporal lobe and left inferior frontal lobe, likely the sequela of prior trauma. Electronically Signed   By: Merilyn Baba M.D.   On: 03/21/2022 18:20   DG Wrist 2 Views Right  Result Date: 03/20/2022 CLINICAL DATA:  Wrist pain. EXAM: RIGHT WRIST - 2 VIEW COMPARISON:  None Available. FINDINGS: There is soft tissue swelling surrounding the wrist. There is no acute fracture or dislocation. There is mild radiocarpal joint space narrowing. IMPRESSION: 1. No acute fracture or dislocation. 2. Soft tissue swelling surrounding the wrist. Electronically Signed   By: Ronney Asters M.D.   On: 03/20/2022 23:04   CT HEAD WO  CONTRAST  Result Date: 03/20/2022 CLINICAL DATA:  New onset seizure, recent trauma with subdural hematoma. EXAM: CT HEAD WITHOUT CONTRAST TECHNIQUE: Contiguous axial images were obtained from the base of the skull through the vertex without intravenous contrast. RADIATION DOSE REDUCTION: This exam was performed according to the departmental dose-optimization program which includes automated exposure control, adjustment of the mA and/or kV according to patient size and/or use of iterative reconstruction technique. COMPARISON:  01/25/2022 FINDINGS: Brain: No evidence of acute infarction, hydrocephalus, extra-axial collection or mass lesion/mass effect. Trace residual left parietal subdural hematoma (coronal image 52), measuring 5 mm in thickness, previously 19 mm. Encephalomalacic changes in the left frontal lobe (series 2/image 11). Prior left frontal contusion with subarachnoid hemorrhage has resolved. Subcortical white matter and periventricular small vessel ischemic changes. Vascular: Intracranial atherosclerosis. Skull: Normal. Negative for fracture or focal lesion. Sinuses/Orbits: Minimal partial opacification of the bilateral maxillary sinuses. Mastoid air cells are clear. Other: None. IMPRESSION: Trace  residual left parietal subdural hematoma, as above. Encephalomalacic changes in the left frontal lobe at the site of prior left frontal contusion. Small vessel ischemic changes. Electronically Signed   By: Julian Hy M.D.   On: 03/20/2022 18:13    Scheduled Meds:  calcium carbonate  800 mg of elemental calcium Oral TID WC   chlordiazePOXIDE  10 mg Oral TID   folic acid  1 mg Oral Daily   levETIRAcetam  500 mg Oral BID   multivitamin with minerals  1 tablet Oral Once   nicotine  21 mg Transdermal Daily   pantoprazole  40 mg Oral Daily   thiamine  100 mg Oral Daily   Or   thiamine  100 mg Intravenous Daily   Continuous Infusions:  dextrose 5% lactated ringers with KCl 20 mEq/L 50 mL/hr at  03/22/22 1119    LOS: 2 days   Time spent: 35 mins  Zacaria Pousson Wynetta Emery, MD How to contact the [facility] Attending or Consulting provider [place] or covering provider during after hours [place], for this patient?  Check the care team in [facility] and look for a) attending/consulting [facility] provider listed and b) the [facility] team listed Log into [email or web address] and use [place]'s universal password to access. If you do not have the password, please contact the hospital operator. Locate the [facility] provider you are looking for under [facility] and page to a number that you can be directly reached. If you still have difficulty reaching the provider, please page the [facility] (Director on Call) for the Hospitalists listed on amion for assistance.  03/22/2022, 1:08 PM

## 2022-03-23 DIAGNOSIS — F10939 Alcohol use, unspecified with withdrawal, unspecified: Secondary | ICD-10-CM | POA: Diagnosis not present

## 2022-03-23 DIAGNOSIS — R569 Unspecified convulsions: Principal | ICD-10-CM | POA: Insufficient documentation

## 2022-03-23 DIAGNOSIS — F10931 Alcohol use, unspecified with withdrawal delirium: Secondary | ICD-10-CM

## 2022-03-23 LAB — COMPREHENSIVE METABOLIC PANEL
ALT: 13 U/L (ref 0–44)
AST: 17 U/L (ref 15–41)
Albumin: 3.1 g/dL — ABNORMAL LOW (ref 3.5–5.0)
Alkaline Phosphatase: 92 U/L (ref 38–126)
Anion gap: 7 (ref 5–15)
BUN: <5 mg/dL — ABNORMAL LOW (ref 6–20)
CO2: 22 mmol/L (ref 22–32)
Calcium: 8.7 mg/dL — ABNORMAL LOW (ref 8.9–10.3)
Chloride: 111 mmol/L (ref 98–111)
Creatinine, Ser: 0.54 mg/dL — ABNORMAL LOW (ref 0.61–1.24)
GFR, Estimated: >60 mL/min (ref >60)
Glucose, Bld: 115 mg/dL — ABNORMAL HIGH (ref 70–99)
Potassium: 3.7 mmol/L (ref 3.5–5.1)
Sodium: 140 mmol/L (ref 135–145)
Total Bilirubin: 1 mg/dL (ref 0.3–1.2)
Total Protein: 6.7 g/dL (ref 6.5–8.1)

## 2022-03-23 LAB — MAGNESIUM: Magnesium: 1.3 mg/dL — ABNORMAL LOW (ref 1.7–2.4)

## 2022-03-23 LAB — MRSA NEXT GEN BY PCR, NASAL: MRSA by PCR Next Gen: NOT DETECTED

## 2022-03-23 MED ORDER — MAGNESIUM SULFATE 2 GM/50ML IV SOLN
2.0000 g | Freq: Once | INTRAVENOUS | Status: AC
  Administered 2022-03-23: 2 g via INTRAVENOUS
  Filled 2022-03-23: qty 50

## 2022-03-23 NOTE — Progress Notes (Signed)
Patient continued to be agitated, getting up without assistance and not able to follow directions.  Patient did have 1:1 sitter and was scoring consistently high on the CIWA scale.  Received order for transfer to ICU.

## 2022-03-23 NOTE — Progress Notes (Signed)
Patient agitated, not following commands. Safety sitter at the bedside. Patient scoring high on his CIWA. Received order for Haldol. Haldol given. Received order for transfer to ICU.

## 2022-03-23 NOTE — Assessment & Plan Note (Addendum)
-   Family reports that patient has been a heavy drinker - Patient will not quantify how much he drinks -  his last drink was 10/14 and he had 1 beer per his report - He reports he only drinks on days that he is not working, but he has been out of work due to his recent trauma with subdural hematoma -remained agitated on librium and CIWA -03/23/22 AM--transferred to ICU and started on Precedex -03/24/22 AM--weaned off Precedex -he is being tapered on librium  -discharge home on librium taper  -10/19-repeat CT brain--pt complains of ?diplopia>>>Unchanged trace residual subdural hematoma along the left parietal convexity Overall improved;  A&O x 3 but has some hospital delirium

## 2022-03-23 NOTE — Progress Notes (Signed)
Report given to Birdie Riddle, ICU RN.

## 2022-03-23 NOTE — Progress Notes (Signed)
PROGRESS NOTE  Eric Conley ION:[id number] DOB: 1960-11-12 DOA: 03/20/2022 PCP: Sharilyn Sites, MD  Brief History:  61 y.o. male with medical history significant of acid reflux, depression, hypertension, alcohol dependence, subdural hematoma and ,left parietal extra-axial hemorrhage and hemorrhagic contusions in left frontal and temporal lobe in Aug 2023 presents the ED with a chief complaint of seizure.  Patient gives history that he has given to ED staff.  To me, patient reports he came into the ED because he was lightheaded and hungry.  He reports that he is lightheaded probably because he was hungry.  He has had diarrhea in the interval.  His diarrhea is nonbloody and happens 3 times a day.  He has no associated abdominal pain.  He blames some [place] takeout food for his diarrhea.  He reports that the lightheadedness happen when he woke up.  He reports that it was present before he had any postural changes.  He reports that his hands were shaky because he had not eaten.  He reports no loss of consciousness, no loss of time, no falls.  Patient reports that he used to have a history of alcohol dependence but does not anymore.  He reports that his last drink was yesterday.  It was 1 beer.  When asked him that she normally drinks he will not give an answer.  He does report that he only drinks on days when he does not have to work.  When it is brought to his attention that he is has not had to work in several weeks because of his recent trauma, he reports that he has not been drinking has been in the hospital.   In the ED he reported that he had woken up this morning feeling tingly and having intermittent numbness to his left arm.  He reports he woke up with the symptoms today.  Did not get any better throughout the day.  EMS was called and in route to the hospital patient had witnessed tonic-clonic activity which lasted for about a minute.  2-1/2 mg of Versed was given which aborted the  tonic-clonic activity.  Patient was postictal on arrival to the hospital.  Patient does not remember any of this.   Patient does drink and does smoke as discussed above.  He does not use illicit drugs.  He is vaccinated for COVID.  Patient is full code.  Neurology was consulted and patient was started on Keppra.  He did not have any further seizures.  Unfortunately, the patient began developing alcohol withdrawal manifested by increasing agitation.  He was transferred to the ICU and placed on Precedex in the early morning 03/23/2022.  Assessment and Plan: * Seizure ([place]) - secondary to intracranial bleed, SDH, hemorrhagic brain contusions -severe hypomagnesemia may have contributed - Neuro consulted and recommending keppra 500 mg BID  - Continue CIWA protocol for alcohol withdrawal  -Continue to monitor and replete magnesium - Seizure precautions - Continue to monitor  Diarrhea - 2 days of nonbloody diarrhea reported PTA - contributed to to electrolyte abnormalities - The patient continues to have diarrhea here we will get stool studies - No abdominal pain presently - Continue to monitor -- seems to be resolved now  Delirium tremens ([place]) - Family reports that patient has been a heavy drinker - Patient will not quantify how much he drinks -  his last drink was 10/14 and he had 1 beer per his report - He reports  he only drinks on days that he is not working, but he has been out of work due to his recent trauma with subdural hematoma -remained agitated on librium and CIWA -03/23/22 AM--transferred to ICU and started on Precedex  Wrist pain - Patient reports right wrist is painful - Slightly swollen compared to left wrist - Patient does not remember injuring it, so the possibility that it was her during seizure activity - X-ray with no acute fractures found, wrist splint ordered for comfort.  - judicious opioids  Tobacco use disorder - Patient reports he smokes 2 or 3 cigarettes a  day - nicotine patch ordered  - Counseled on importance of cessation  GERD (gastroesophageal reflux disease) - Continue PPI  Hypocalcemia - Calcium 6.1 upon presentation -Corrects to 6.6 for albumin - Replaced with 1 g of calcium gluconate and tums oral TID - am CMP  Hypomagnesemia - admission Magnesium is undetectable at less than 0.5 -Secondary to poor p.o. intake and GI losses - repleted   Hypokalemia - Potassium is 2.6 upon presentation - Secondary to poor p.o. intake and GI losses - continue to replace, recheck in AM    Family Communication:  no Family at bedside  Consultants:  none  Code Status:  FULL  DVT Prophylaxis:  [place] Heparin / [place] Lovenox   Procedures: As Listed in Progress Note Above  Antibiotics: None      Subjective: Pleasantly confused on Precedex.  Denies cp, sob, n/v/ abd pain.  Remainder ROS unobtainable  Objective: Vitals:   03/23/22 0500 03/23/22 0700 03/23/22 0801 03/23/22 0802  BP:  (!) 147/71 (!) 161/93   Pulse:  (!) 55  86  Resp:  18 19 14   Temp:      TempSrc:      SpO2:  98%  100%  Weight: 66.7 kg     Height:        Intake/Output Summary (Last 24 hours) at 03/23/2022 0824 Last data filed at 03/23/2022 0132 Gross per 24 hour  Intake 1462.77 ml  Output 2450 ml  Net -987.23 ml   Weight change:  Exam:  General:  Pt is alert, follows on step  commands appropriately, not in acute distress HEENT: No icterus, No thrush, No neck mass, [place]/AT Cardiovascular: RRR, S1/S2, no rubs, no gallops Respiratory: CTA bilaterally, no wheezing, no crackles, no rhonchi Abdomen: Soft/+BS, non tender, non distended, no guarding Extremities: No edema, No lymphangitis, No petechiae, No rashes, no synovitis   Data Reviewed: I have personally reviewed following labs and imaging studies Basic Metabolic Panel: Recent Labs  Lab 03/20/22 1841 03/20/22 2356 03/21/22 0516 03/22/22 0503  NA 134* 137 138 136  K 2.8* 2.7* 2.6* 3.5  CL 98 104  107 106  CO2 26 22 22 22   GLUCOSE 107* 106* 123* 106*  BUN <5* <5* <5* <5*  CREATININE 0.89 0.73 0.60* 0.60*  CALCIUM 6.1* 6.4* 6.9* 8.2*  MG <0.5* 1.4* 2.4  --   PHOS  --   --   --  2.7   Liver Function Tests: Recent Labs  Lab 03/20/22 1841 03/21/22 0516 03/22/22 0503  AST 26 19  --   ALT 14 12  --   ALKPHOS 101 98  --   BILITOT 0.8 0.9  --   PROT 6.7 6.0*  --   ALBUMIN 3.4* 3.0* 3.0*   No results for input(s): "LIPASE", "AMYLASE" in the last 168 hours. No results for input(s): "AMMONIA" in the last 168 hours. Coagulation Profile: Recent  Labs  Lab 03/20/22 1841  INR 1.1   CBC: Recent Labs  Lab 03/20/22 1841 03/21/22 0516  WBC 5.2 3.7*  NEUTROABS 3.7 2.4  HGB 13.7 13.1  HCT 39.7 38.1*  MCV 97.3 99.2  PLT 208 202   Cardiac Enzymes: No results for input(s): "CKTOTAL", "CKMB", "CKMBINDEX", "TROPONINI" in the last 168 hours. BNP: Invalid input(s): "POCBNP" CBG: Recent Labs  Lab 03/20/22 1749 03/20/22 1852  GLUCAP 133* 115*   HbA1C: No results for input(s): "HGBA1C" in the last 72 hours. Urine analysis:    Component Value Date/Time   COLORURINE STRAW (A) 03/20/2022 2047   APPEARANCEUR CLEAR 03/20/2022 2047   LABSPEC 1.002 (L) 03/20/2022 2047   PHURINE 6.0 03/20/2022 2047   GLUCOSEU NEGATIVE 03/20/2022 2047   HGBUR NEGATIVE 03/20/2022 2047   BILIRUBINUR NEGATIVE 03/20/2022 2047   KETONESUR NEGATIVE 03/20/2022 2047   PROTEINUR NEGATIVE 03/20/2022 2047   NITRITE NEGATIVE 03/20/2022 2047   LEUKOCYTESUR NEGATIVE 03/20/2022 2047   Sepsis Labs: @LABRCNTIP (procalcitonin:4,lacticidven:4) ) Recent Results (from the past 240 hour(s))  MRSA Next Gen by PCR, Nasal     Status: None   Collection Time: 03/22/22 11:40 PM   Specimen: Nasal Mucosa; Nasal Swab  Result Value Ref Range Status   MRSA by PCR Next Gen NOT DETECTED NOT DETECTED Final    Comment: (NOTE) The GeneXpert MRSA Assay (FDA approved for NASAL specimens only), is one component of a  comprehensive MRSA colonization surveillance program. It is not intended to diagnose MRSA infection nor to guide or monitor treatment for MRSA infections. Test performance is not FDA approved in patients less than 31 years old. Performed at [facility], [address] [place], [place]      Scheduled Meds:  calcium carbonate  800 mg of elemental calcium Oral TID WC   Chlorhexidine Gluconate Cloth  6 each Topical Daily   folic acid  1 mg Oral Daily   levETIRAcetam  500 mg Oral BID   multivitamin with minerals  1 tablet Oral Once   nicotine  21 mg Transdermal Daily   pantoprazole  40 mg Oral Daily   thiamine  100 mg Oral Daily   Or   thiamine  100 mg Intravenous Daily   Continuous Infusions:  dexmedetomidine (PRECEDEX) IV infusion 0.7 mcg/kg/hr (03/23/22 0132)   dextrose 5% lactated ringers with KCl 20 mEq/L 50 mL/hr at 03/23/22 0132    Procedures/Studies: MR CERVICAL SPINE WO CONTRAST  Result Date: 03/21/2022 CLINICAL DATA:  Paresthesias, dizziness, cervical radiculopathy EXAM: MRI CERVICAL AND THORACIC SPINE WITHOUT CONTRAST TECHNIQUE: Multiplanar and multiecho pulse sequences of the cervical spine, to include the craniocervical junction and cervicothoracic junction, and the thoracic spine, were obtained without intravenous contrast. COMPARISON:  No prior MRI of the cervical or thoracic spine, correlation is made with 01/24/2022 CT cervical spine FINDINGS: MRI CERVICAL SPINE FINDINGS Evaluation is somewhat limited by motion artifact. Alignment: No listhesis. Vertebrae: No fracture, evidence of discitis, or bone lesion. Cord: Normal signal and morphology. Posterior Fossa, vertebral arteries, paraspinal tissues: Negative. Disc levels: C2-C3: No significant disc bulge. No spinal canal stenosis or neuroforaminal narrowing. C3-C4: No significant disc bulge. No spinal canal stenosis or neuroforaminal narrowing. C4-C5: Disc height loss and mild disc bulge. Facet and uncovertebral  hypertrophy, right-greater-than-left. No spinal canal stenosis. Mild-to-moderate right neural foraminal narrowing. C5-C6: Disc height loss and mild disc bulge. Facet and uncovertebral hypertrophy. No spinal canal stenosis. Mild bilateral neural foraminal narrowing. C6-C7: Disc height loss and mild disc bulge with disc  osteophyte complex. Facet and uncovertebral hypertrophy. No spinal canal stenosis. Mild bilateral neural foraminal narrowing. C7-T1: No significant disc bulge. No spinal canal stenosis or neuroforaminal narrowing. MRI THORACIC SPINE FINDINGS Alignment: No listhesis. Preservation of the normal thoracic kyphosis. Vertebrae: No fracture, evidence of discitis, or bone lesion. Cord:  Normal signal and morphology. Paraspinal and other soft tissues: Trace pleural effusions. Disc levels: No significant spinal canal stenosis or neural foraminal narrowing. IMPRESSION: 1. C4-C5 mild-to-moderate right neural foraminal narrowing. 2. C5-C6 and C6-C7 mild bilateral neural foraminal narrowing. 3. No spinal canal stenosis in the cervical spine. 4. No significant spinal canal stenosis or neural foraminal narrowing in the thoracic spine. 5. Trace pleural effusions. Electronically Signed   By: Wiliam KeAlison  Vasan M.D.   On: 03/21/2022 18:44   MR THORACIC SPINE WO CONTRAST  Result Date: 03/21/2022 CLINICAL DATA:  Paresthesias, dizziness, cervical radiculopathy EXAM: MRI CERVICAL AND THORACIC SPINE WITHOUT CONTRAST TECHNIQUE: Multiplanar and multiecho pulse sequences of the cervical spine, to include the craniocervical junction and cervicothoracic junction, and the thoracic spine, were obtained without intravenous contrast. COMPARISON:  No prior MRI of the cervical or thoracic spine, correlation is made with 01/24/2022 CT cervical spine FINDINGS: MRI CERVICAL SPINE FINDINGS Evaluation is somewhat limited by motion artifact. Alignment: No listhesis. Vertebrae: No fracture, evidence of discitis, or bone lesion. Cord: Normal  signal and morphology. Posterior Fossa, vertebral arteries, paraspinal tissues: Negative. Disc levels: C2-C3: No significant disc bulge. No spinal canal stenosis or neuroforaminal narrowing. C3-C4: No significant disc bulge. No spinal canal stenosis or neuroforaminal narrowing. C4-C5: Disc height loss and mild disc bulge. Facet and uncovertebral hypertrophy, right-greater-than-left. No spinal canal stenosis. Mild-to-moderate right neural foraminal narrowing. C5-C6: Disc height loss and mild disc bulge. Facet and uncovertebral hypertrophy. No spinal canal stenosis. Mild bilateral neural foraminal narrowing. C6-C7: Disc height loss and mild disc bulge with disc osteophyte complex. Facet and uncovertebral hypertrophy. No spinal canal stenosis. Mild bilateral neural foraminal narrowing. C7-T1: No significant disc bulge. No spinal canal stenosis or neuroforaminal narrowing. MRI THORACIC SPINE FINDINGS Alignment: No listhesis. Preservation of the normal thoracic kyphosis. Vertebrae: No fracture, evidence of discitis, or bone lesion. Cord:  Normal signal and morphology. Paraspinal and other soft tissues: Trace pleural effusions. Disc levels: No significant spinal canal stenosis or neural foraminal narrowing. IMPRESSION: 1. C4-C5 mild-to-moderate right neural foraminal narrowing. 2. C5-C6 and C6-C7 mild bilateral neural foraminal narrowing. 3. No spinal canal stenosis in the cervical spine. 4. No significant spinal canal stenosis or neural foraminal narrowing in the thoracic spine. 5. Trace pleural effusions. Electronically Signed   By: Wiliam KeAlison  Vasan M.D.   On: 03/21/2022 18:44   MR BRAIN WO CONTRAST  Result Date: 03/21/2022 CLINICAL DATA:  Paresthesias, dizziness, could be secondary to fall induced neuropathy or spinal stenosis. Concern for seizure. EXAM: MRI HEAD WITHOUT CONTRAST TECHNIQUE: Multiplanar, multiecho pulse sequences of the brain and surrounding structures were obtained without intravenous contrast.  COMPARISON:  No prior MRI, correlation is made with head CT 03/20/2022 FINDINGS: Evaluation is somewhat limited by motion artifact. Brain: No restricted diffusion to suggest acute or subacute infarct. No acute hemorrhage, mass, mass effect, or midline shift. No hydrocephalus. 5 mm residual left parietal subdural hematoma (series 18, image 7), as seen on the recent CT. No new extra-axial collection. Remote right posterior lentiform nucleus lacunar infarct. Encephalomalacia in the left anteromedial temporal lobe (series 15, image 12) and left inferior frontal lobe (series 15, images 19 and 22), likely the sequela of prior trauma. These  areas are associated with hemosiderin deposition. The hippocampi are symmetric in size and signal. No heterotopia or evidence of cortical dysgenesis. Vascular: Normal arterial flow voids. Skull and upper cervical spine: Normal marrow signal. Sinuses/Orbits: Mucosal thickening in the maxillary sinuses and ethmoid air cells. The orbits are unremarkable. Other: Trace fluid in left mastoid air cells. IMPRESSION: 1. No acute intracranial process. No acute or subacute infarct. No seizure etiology identified. 2. 5 mm residual left parietal subdural hematoma, as seen on the recent CT. 3. Encephalomalacia in the left anteromedial temporal lobe and left inferior frontal lobe, likely the sequela of prior trauma. Electronically Signed   By: Wiliam Ke M.D.   On: 03/21/2022 18:20   DG Wrist 2 Views Right  Result Date: 03/20/2022 CLINICAL DATA:  Wrist pain. EXAM: RIGHT WRIST - 2 VIEW COMPARISON:  None Available. FINDINGS: There is soft tissue swelling surrounding the wrist. There is no acute fracture or dislocation. There is mild radiocarpal joint space narrowing. IMPRESSION: 1. No acute fracture or dislocation. 2. Soft tissue swelling surrounding the wrist. Electronically Signed   By: Darliss Cheney M.D.   On: 03/20/2022 23:04   CT HEAD WO CONTRAST  Result Date: 03/20/2022 CLINICAL DATA:   New onset seizure, recent trauma with subdural hematoma. EXAM: CT HEAD WITHOUT CONTRAST TECHNIQUE: Contiguous axial images were obtained from the base of the skull through the vertex without intravenous contrast. RADIATION DOSE REDUCTION: This exam was performed according to the departmental dose-optimization program which includes automated exposure control, adjustment of the mA and/or kV according to patient size and/or use of iterative reconstruction technique. COMPARISON:  01/25/2022 FINDINGS: Brain: No evidence of acute infarction, hydrocephalus, extra-axial collection or mass lesion/mass effect. Trace residual left parietal subdural hematoma (coronal image 52), measuring 5 mm in thickness, previously 19 mm. Encephalomalacic changes in the left frontal lobe (series 2/image 11). Prior left frontal contusion with subarachnoid hemorrhage has resolved. Subcortical white matter and periventricular small vessel ischemic changes. Vascular: Intracranial atherosclerosis. Skull: Normal. Negative for fracture or focal lesion. Sinuses/Orbits: Minimal partial opacification of the bilateral maxillary sinuses. Mastoid air cells are clear. Other: None. IMPRESSION: Trace residual left parietal subdural hematoma, as above. Encephalomalacic changes in the left frontal lobe at the site of prior left frontal contusion. Small vessel ischemic changes. Electronically Signed   By: Charline Bills M.D.   On: 03/20/2022 18:13    Catarina Hartshorn, DO  [facility]  If 7PM-7AM, please contact night-coverage [email or web address] Password [email or web address] 03/23/2022, 8:24 AM   LOS: 3 days

## 2022-03-24 ENCOUNTER — Inpatient Hospital Stay (HOSPITAL_COMMUNITY): Payer: 59

## 2022-03-24 DIAGNOSIS — R569 Unspecified convulsions: Secondary | ICD-10-CM | POA: Diagnosis not present

## 2022-03-24 DIAGNOSIS — F10939 Alcohol use, unspecified with withdrawal, unspecified: Secondary | ICD-10-CM | POA: Diagnosis not present

## 2022-03-24 DIAGNOSIS — F10931 Alcohol use, unspecified with withdrawal delirium: Secondary | ICD-10-CM | POA: Diagnosis not present

## 2022-03-24 LAB — CBC
HCT: 34.7 % — ABNORMAL LOW (ref 39.0–52.0)
Hemoglobin: 11.7 g/dL — ABNORMAL LOW (ref 13.0–17.0)
MCH: 34.2 pg — ABNORMAL HIGH (ref 26.0–34.0)
MCHC: 33.7 g/dL (ref 30.0–36.0)
MCV: 101.5 fL — ABNORMAL HIGH (ref 80.0–100.0)
Platelets: 245 10*3/uL (ref 150–400)
RBC: 3.42 MIL/uL — ABNORMAL LOW (ref 4.22–5.81)
RDW: 15 % (ref 11.5–15.5)
WBC: 4.9 10*3/uL (ref 4.0–10.5)
nRBC: 0 % (ref 0.0–0.2)

## 2022-03-24 LAB — URINALYSIS, COMPLETE (UACMP) WITH MICROSCOPIC
Bacteria, UA: NONE SEEN
Bilirubin Urine: NEGATIVE
Glucose, UA: 50 mg/dL — AB
Hgb urine dipstick: NEGATIVE
Ketones, ur: NEGATIVE mg/dL
Leukocytes,Ua: NEGATIVE
Nitrite: NEGATIVE
Protein, ur: NEGATIVE mg/dL
Specific Gravity, Urine: 1.013 (ref 1.005–1.030)
pH: 9 — ABNORMAL HIGH (ref 5.0–8.0)

## 2022-03-24 LAB — BASIC METABOLIC PANEL
Anion gap: 8 (ref 5–15)
BUN: 6 mg/dL (ref 6–20)
CO2: 24 mmol/L (ref 22–32)
Calcium: 8.3 mg/dL — ABNORMAL LOW (ref 8.9–10.3)
Chloride: 106 mmol/L (ref 98–111)
Creatinine, Ser: 0.71 mg/dL (ref 0.61–1.24)
GFR, Estimated: >60 mL/min (ref >60)
Glucose, Bld: 119 mg/dL — ABNORMAL HIGH (ref 70–99)
Potassium: 3.5 mmol/L (ref 3.5–5.1)
Sodium: 138 mmol/L (ref 135–145)

## 2022-03-24 LAB — MAGNESIUM: Magnesium: 1.5 mg/dL — ABNORMAL LOW (ref 1.7–2.4)

## 2022-03-24 LAB — PROCALCITONIN: Procalcitonin: <0.1 ng/mL

## 2022-03-24 LAB — LACTIC ACID, PLASMA: Lactic Acid, Venous: 0.8 mmol/L (ref 0.5–1.9)

## 2022-03-24 MED ORDER — CHLORDIAZEPOXIDE HCL 25 MG PO CAPS
25.0000 mg | ORAL_CAPSULE | Freq: Three times a day (TID) | ORAL | Status: DC
  Administered 2022-03-24 – 2022-03-27 (×10): 25 mg via ORAL
  Filled 2022-03-24 (×10): qty 1

## 2022-03-24 MED ORDER — POTASSIUM CHLORIDE CRYS ER 20 MEQ PO TBCR
40.0000 meq | EXTENDED_RELEASE_TABLET | Freq: Once | ORAL | Status: AC
  Administered 2022-03-24: 40 meq via ORAL
  Filled 2022-03-24: qty 2

## 2022-03-24 MED ORDER — MAGNESIUM SULFATE 2 GM/50ML IV SOLN
2.0000 g | Freq: Once | INTRAVENOUS | Status: AC
  Administered 2022-03-24: 2 g via INTRAVENOUS
  Filled 2022-03-24: qty 50

## 2022-03-24 MED ORDER — POTASSIUM CHLORIDE CRYS ER 20 MEQ PO TBCR
20.0000 meq | EXTENDED_RELEASE_TABLET | Freq: Once | ORAL | Status: AC
  Administered 2022-03-24: 20 meq via ORAL
  Filled 2022-03-24: qty 1

## 2022-03-24 MED ORDER — LACTATED RINGERS IV BOLUS
1000.0000 mL | Freq: Once | INTRAVENOUS | Status: AC
  Administered 2022-03-24: 1000 mL via INTRAVENOUS

## 2022-03-24 MED ORDER — POTASSIUM CHLORIDE 10 MEQ/100ML IV SOLN
10.0000 meq | INTRAVENOUS | Status: AC
  Administered 2022-03-24 (×2): 10 meq via INTRAVENOUS
  Filled 2022-03-24 (×2): qty 100

## 2022-03-24 MED ORDER — POTASSIUM CHLORIDE IN NACL 20-0.9 MEQ/L-% IV SOLN: INTRAVENOUS | Status: DC

## 2022-03-24 NOTE — Progress Notes (Addendum)
PROGRESS NOTE  Eric Conley OEH:[id number] DOB: 1960-09-28 DOA: 03/20/2022 PCP: Assunta Found, MD  Brief History:  61 y.o. male with medical history significant of acid reflux, depression, hypertension, alcohol dependence, subdural hematoma and ,left parietal extra-axial hemorrhage and hemorrhagic contusions in left frontal and temporal lobe in Aug 2023 presents the ED with a chief complaint of seizure.  Patient gives history that he has given to ED staff.  To me, patient reports he came into the ED because he was lightheaded and hungry.  He reports that he is lightheaded probably because he was hungry.  He has had diarrhea in the interval.  His diarrhea is nonbloody and happens 3 times a day.  He has no associated abdominal pain.  He blames some [place] takeout food for his diarrhea.  He reports that the lightheadedness happen when he woke up.  He reports that it was present before he had any postural changes.  He reports that his hands were shaky because he had not eaten.  He reports no loss of consciousness, no loss of time, no falls.  Patient reports that he used to have a history of alcohol dependence but does not anymore.  He reports that his last drink was yesterday.  It was 1 beer.  When asked him that she normally drinks he will not give an answer.  He does report that he only drinks on days when he does not have to work.  When it is brought to his attention that he is has not had to work in several weeks because of his recent trauma, he reports that he has not been drinking has been in the hospital.   In the ED he reported that he had woken up this morning feeling tingly and having intermittent numbness to his left arm.  He reports he woke up with the symptoms today.  Did not get any better throughout the day.  EMS was called and in route to the hospital patient had witnessed tonic-clonic activity which lasted for about a minute.  2-1/2 mg of Versed was given which aborted the  tonic-clonic activity.  Patient was postictal on arrival to the hospital.  Patient does not remember any of this.   Patient does drink and does smoke as discussed above.  He does not use illicit drugs.  He is vaccinated for COVID.  Patient is full code.  Neurology was consulted and patient was started on Keppra.  He did not have any further seizures.  Unfortunately, the patient began developing alcohol withdrawal manifested by increasing agitation.  He was transferred to the ICU and placed on Precedex in the early morning 03/23/2022.     Assessment and Plan: * Seizure (HCC) - secondary to intracranial bleed, SDH, hemorrhagic brain contusions -severe hypomagnesemia may have contributed - Neuro consulted and recommending keppra 500 mg BID  - Continue CIWA protocol for alcohol withdrawal  -Continue to monitor and replete magnesium - Seizure precautions - Continue to monitor -no further seizures during hospitalization  Diarrhea - 2 days of nonbloody diarrhea reported PTA - contributed to to electrolyte abnormalities - The patient continues to have diarrhea here we will get stool studies - No abdominal pain presently - resolved.  Tolerating diet  Delirium tremens (HCC) - Family reports that patient has been a heavy drinker - Patient will not quantify how much he drinks -  his last drink was 10/14 and he had 1 beer per his report -  He reports he only drinks on days that he is not working, but he has been out of work due to his recent trauma with subdural hematoma -remained agitated on librium and CIWA -03/23/22 AM--transferred to ICU and started on Precedex -03/24/22 AM--weaned off Precedex -10/19-repeat CT brain--pt complains of ?diplopia  Wrist pain - Patient reports right wrist is painful - Slightly swollen compared to left wrist - Patient does not remember injuring it, so the possibility that it was her during seizure activity - X-ray with no acute fractures found, wrist splint  ordered for comfort.  - judicious opioids  Tobacco use disorder - Patient reports he smokes 2 or 3 cigarettes a day - nicotine patch ordered  - Counseled on importance of cessation  GERD (gastroesophageal reflux disease) - Continue PPI  Hypocalcemia - Calcium 6.1 upon presentation -Corrects to 6.6 for albumin - Replaced with 1 g of calcium gluconate and tums oral TID - am CMP  Hypomagnesemia - admission Magnesium is undetectable at less than 0.5 -Secondary to poor p.o. intake and GI losses - repleting IV again  Hypokalemia - Potassium is 2.6 upon presentation - Secondary to poor p.o. intake and GI losses - continue to replace, recheck in AM     Family Communication:  no Family at bedside   Consultants:  none   Code Status:  FULL   DVT Prophylaxis: SCDs     Procedures: As Listed in Progress Note Above   Antibiotics: None          Subjective: Complains of diplopia.  Denies new focal weakness.  Denies cp, sob, n/v/d, abd pain, dysuria  Objective: Vitals:   03/24/22 1000 03/24/22 1100 03/24/22 1121 03/24/22 1200  BP: 127/78 110/67  128/86  Pulse: 84 (!) 118  (!) 110  Resp: 11 16  20   Temp:   97.6 F (36.4 C)   TempSrc:   Oral   SpO2: 100% 100%  98%  Weight:      Height:        Intake/Output Summary (Last 24 hours) at 03/24/2022 1220 Last data filed at 03/24/2022 1212 Gross per 24 hour  Intake 969.37 ml  Output 1300 ml  Net -330.63 ml   Weight change:  Exam:  General:  Pt is alert, follows commands appropriately, not in acute distress HEENT: No icterus, No thrush, No neck mass, [place]/AT Cardiovascular: RRR, S1/S2, no rubs, no gallops Respiratory: CTA bilaterally, no wheezing, no crackles, no rhonchi Abdomen: Soft/+BS, non tender, non distended, no guarding Extremities: No edema, No lymphangitis, No petechiae, No rashes, no synovitis Neuro:  CN II-XII intact, strength 4/5 in RUE, RLE, strength 4/5 LUE, LLE; sensation intact bilateral; no  dysmetria; babinski equivocal    Data Reviewed: I have personally reviewed following labs and imaging studies Basic Metabolic Panel: Recent Labs  Lab 03/20/22 1841 03/20/22 2356 03/21/22 0516 03/22/22 0503 03/23/22 0916 03/24/22 0310  NA 134* 137 138 136 140 138  K 2.8* 2.7* 2.6* 3.5 3.7 3.5  CL 98 104 107 106 111 106  CO2 26 22 22 22 22 24   GLUCOSE 107* 106* 123* 106* 115* 119*  BUN <5* <5* <5* <5* <5* 6  CREATININE 0.89 0.73 0.60* 0.60* 0.54* 0.71  CALCIUM 6.1* 6.4* 6.9* 8.2* 8.7* 8.3*  MG <0.5* 1.4* 2.4  --  1.3* 1.5*  PHOS  --   --   --  2.7  --   --    Liver Function Tests: Recent Labs  Lab 03/20/22 1841 03/21/22 0516  03/22/22 0503 03/23/22 0916  AST 26 19  --  17  ALT 14 12  --  13  ALKPHOS 101 98  --  92  BILITOT 0.8 0.9  --  1.0  PROT 6.7 6.0*  --  6.7  ALBUMIN 3.4* 3.0* 3.0* 3.1*   No results for input(s): "LIPASE", "AMYLASE" in the last 168 hours. No results for input(s): "AMMONIA" in the last 168 hours. Coagulation Profile: Recent Labs  Lab 03/20/22 1841  INR 1.1   CBC: Recent Labs  Lab 03/20/22 1841 03/21/22 0516 03/24/22 0310  WBC 5.2 3.7* 4.9  NEUTROABS 3.7 2.4  --   HGB 13.7 13.1 11.7*  HCT 39.7 38.1* 34.7*  MCV 97.3 99.2 101.5*  PLT 208 202 245   Cardiac Enzymes: No results for input(s): "CKTOTAL", "CKMB", "CKMBINDEX", "TROPONINI" in the last 168 hours. BNP: Invalid input(s): "POCBNP" CBG: Recent Labs  Lab 03/20/22 1749 03/20/22 1852  GLUCAP 133* 115*   HbA1C: No results for input(s): "HGBA1C" in the last 72 hours. Urine analysis:    Component Value Date/Time   COLORURINE STRAW (A) 03/20/2022 2047   APPEARANCEUR CLEAR 03/20/2022 2047   LABSPEC 1.002 (L) 03/20/2022 2047   PHURINE 6.0 03/20/2022 2047   GLUCOSEU NEGATIVE 03/20/2022 2047   HGBUR NEGATIVE 03/20/2022 2047   BILIRUBINUR NEGATIVE 03/20/2022 2047   KETONESUR NEGATIVE 03/20/2022 2047   PROTEINUR NEGATIVE 03/20/2022 2047   NITRITE NEGATIVE 03/20/2022 2047    LEUKOCYTESUR NEGATIVE 03/20/2022 2047   Sepsis Labs: @LABRCNTIP (procalcitonin:4,lacticidven:4) ) Recent Results (from the past 240 hour(s))  MRSA Next Gen by PCR, Nasal     Status: None   Collection Time: 03/22/22 11:40 PM   Specimen: Nasal Mucosa; Nasal Swab  Result Value Ref Range Status   MRSA by PCR Next Gen NOT DETECTED NOT DETECTED Final    Comment: (NOTE) The GeneXpert MRSA Assay (FDA approved for NASAL specimens only), is one component of a comprehensive MRSA colonization surveillance program. It is not intended to diagnose MRSA infection nor to guide or monitor treatment for MRSA infections. Test performance is not FDA approved in patients less than 59 years old. Performed at [facility], [address] [place], [place] [address]      Scheduled Meds:  calcium carbonate  800 mg of elemental calcium Oral TID WC   chlordiazePOXIDE  25 mg Oral TID   Chlorhexidine Gluconate Cloth  6 each Topical Daily   folic acid  1 mg Oral Daily   levETIRAcetam  500 mg Oral BID   multivitamin with minerals  1 tablet Oral Once   nicotine  21 mg Transdermal Daily   pantoprazole  40 mg Oral Daily   thiamine  100 mg Oral Daily   Or   thiamine  100 mg Intravenous Daily   Continuous Infusions:  dexmedetomidine (PRECEDEX) IV infusion Stopped (03/24/22 0930)   lactated ringers     magnesium sulfate bolus IVPB 2 g (03/24/22 1212)    Procedures/Studies: MR CERVICAL SPINE WO CONTRAST  Result Date: 03/21/2022 CLINICAL DATA:  Paresthesias, dizziness, cervical radiculopathy EXAM: MRI CERVICAL AND THORACIC SPINE WITHOUT CONTRAST TECHNIQUE: Multiplanar and multiecho pulse sequences of the cervical spine, to include the craniocervical junction and cervicothoracic junction, and the thoracic spine, were obtained without intravenous contrast. COMPARISON:  No prior MRI of the cervical or thoracic spine, correlation is made with 01/24/2022 CT cervical spine FINDINGS: MRI CERVICAL SPINE FINDINGS  Evaluation is somewhat limited by motion artifact. Alignment: No listhesis. Vertebrae: No fracture, evidence of discitis,  or bone lesion. Cord: Normal signal and morphology. Posterior Fossa, vertebral arteries, paraspinal tissues: Negative. Disc levels: C2-C3: No significant disc bulge. No spinal canal stenosis or neuroforaminal narrowing. C3-C4: No significant disc bulge. No spinal canal stenosis or neuroforaminal narrowing. C4-C5: Disc height loss and mild disc bulge. Facet and uncovertebral hypertrophy, right-greater-than-left. No spinal canal stenosis. Mild-to-moderate right neural foraminal narrowing. C5-C6: Disc height loss and mild disc bulge. Facet and uncovertebral hypertrophy. No spinal canal stenosis. Mild bilateral neural foraminal narrowing. C6-C7: Disc height loss and mild disc bulge with disc osteophyte complex. Facet and uncovertebral hypertrophy. No spinal canal stenosis. Mild bilateral neural foraminal narrowing. C7-T1: No significant disc bulge. No spinal canal stenosis or neuroforaminal narrowing. MRI THORACIC SPINE FINDINGS Alignment: No listhesis. Preservation of the normal thoracic kyphosis. Vertebrae: No fracture, evidence of discitis, or bone lesion. Cord:  Normal signal and morphology. Paraspinal and other soft tissues: Trace pleural effusions. Disc levels: No significant spinal canal stenosis or neural foraminal narrowing. IMPRESSION: 1. C4-C5 mild-to-moderate right neural foraminal narrowing. 2. C5-C6 and C6-C7 mild bilateral neural foraminal narrowing. 3. No spinal canal stenosis in the cervical spine. 4. No significant spinal canal stenosis or neural foraminal narrowing in the thoracic spine. 5. Trace pleural effusions. Electronically Signed   By: Wiliam Ke M.D.   On: 03/21/2022 18:44   MR THORACIC SPINE WO CONTRAST  Result Date: 03/21/2022 CLINICAL DATA:  Paresthesias, dizziness, cervical radiculopathy EXAM: MRI CERVICAL AND THORACIC SPINE WITHOUT CONTRAST TECHNIQUE:  Multiplanar and multiecho pulse sequences of the cervical spine, to include the craniocervical junction and cervicothoracic junction, and the thoracic spine, were obtained without intravenous contrast. COMPARISON:  No prior MRI of the cervical or thoracic spine, correlation is made with 01/24/2022 CT cervical spine FINDINGS: MRI CERVICAL SPINE FINDINGS Evaluation is somewhat limited by motion artifact. Alignment: No listhesis. Vertebrae: No fracture, evidence of discitis, or bone lesion. Cord: Normal signal and morphology. Posterior Fossa, vertebral arteries, paraspinal tissues: Negative. Disc levels: C2-C3: No significant disc bulge. No spinal canal stenosis or neuroforaminal narrowing. C3-C4: No significant disc bulge. No spinal canal stenosis or neuroforaminal narrowing. C4-C5: Disc height loss and mild disc bulge. Facet and uncovertebral hypertrophy, right-greater-than-left. No spinal canal stenosis. Mild-to-moderate right neural foraminal narrowing. C5-C6: Disc height loss and mild disc bulge. Facet and uncovertebral hypertrophy. No spinal canal stenosis. Mild bilateral neural foraminal narrowing. C6-C7: Disc height loss and mild disc bulge with disc osteophyte complex. Facet and uncovertebral hypertrophy. No spinal canal stenosis. Mild bilateral neural foraminal narrowing. C7-T1: No significant disc bulge. No spinal canal stenosis or neuroforaminal narrowing. MRI THORACIC SPINE FINDINGS Alignment: No listhesis. Preservation of the normal thoracic kyphosis. Vertebrae: No fracture, evidence of discitis, or bone lesion. Cord:  Normal signal and morphology. Paraspinal and other soft tissues: Trace pleural effusions. Disc levels: No significant spinal canal stenosis or neural foraminal narrowing. IMPRESSION: 1. C4-C5 mild-to-moderate right neural foraminal narrowing. 2. C5-C6 and C6-C7 mild bilateral neural foraminal narrowing. 3. No spinal canal stenosis in the cervical spine. 4. No significant spinal canal  stenosis or neural foraminal narrowing in the thoracic spine. 5. Trace pleural effusions. Electronically Signed   By: Wiliam Ke M.D.   On: 03/21/2022 18:44   MR BRAIN WO CONTRAST  Result Date: 03/21/2022 CLINICAL DATA:  Paresthesias, dizziness, could be secondary to fall induced neuropathy or spinal stenosis. Concern for seizure. EXAM: MRI HEAD WITHOUT CONTRAST TECHNIQUE: Multiplanar, multiecho pulse sequences of the brain and surrounding structures were obtained without intravenous contrast. COMPARISON:  No prior  MRI, correlation is made with head CT 03/20/2022 FINDINGS: Evaluation is somewhat limited by motion artifact. Brain: No restricted diffusion to suggest acute or subacute infarct. No acute hemorrhage, mass, mass effect, or midline shift. No hydrocephalus. 5 mm residual left parietal subdural hematoma (series 18, image 7), as seen on the recent CT. No new extra-axial collection. Remote right posterior lentiform nucleus lacunar infarct. Encephalomalacia in the left anteromedial temporal lobe (series 15, image 12) and left inferior frontal lobe (series 15, images 19 and 22), likely the sequela of prior trauma. These areas are associated with hemosiderin deposition. The hippocampi are symmetric in size and signal. No heterotopia or evidence of cortical dysgenesis. Vascular: Normal arterial flow voids. Skull and upper cervical spine: Normal marrow signal. Sinuses/Orbits: Mucosal thickening in the maxillary sinuses and ethmoid air cells. The orbits are unremarkable. Other: Trace fluid in left mastoid air cells. IMPRESSION: 1. No acute intracranial process. No acute or subacute infarct. No seizure etiology identified. 2. 5 mm residual left parietal subdural hematoma, as seen on the recent CT. 3. Encephalomalacia in the left anteromedial temporal lobe and left inferior frontal lobe, likely the sequela of prior trauma. Electronically Signed   By: Wiliam Ke M.D.   On: 03/21/2022 18:20   DG Wrist 2  Views Right  Result Date: 03/20/2022 CLINICAL DATA:  Wrist pain. EXAM: RIGHT WRIST - 2 VIEW COMPARISON:  None Available. FINDINGS: There is soft tissue swelling surrounding the wrist. There is no acute fracture or dislocation. There is mild radiocarpal joint space narrowing. IMPRESSION: 1. No acute fracture or dislocation. 2. Soft tissue swelling surrounding the wrist. Electronically Signed   By: Darliss Cheney M.D.   On: 03/20/2022 23:04   CT HEAD WO CONTRAST  Result Date: 03/20/2022 CLINICAL DATA:  New onset seizure, recent trauma with subdural hematoma. EXAM: CT HEAD WITHOUT CONTRAST TECHNIQUE: Contiguous axial images were obtained from the base of the skull through the vertex without intravenous contrast. RADIATION DOSE REDUCTION: This exam was performed according to the departmental dose-optimization program which includes automated exposure control, adjustment of the mA and/or kV according to patient size and/or use of iterative reconstruction technique. COMPARISON:  01/25/2022 FINDINGS: Brain: No evidence of acute infarction, hydrocephalus, extra-axial collection or mass lesion/mass effect. Trace residual left parietal subdural hematoma (coronal image 52), measuring 5 mm in thickness, previously 19 mm. Encephalomalacic changes in the left frontal lobe (series 2/image 11). Prior left frontal contusion with subarachnoid hemorrhage has resolved. Subcortical white matter and periventricular small vessel ischemic changes. Vascular: Intracranial atherosclerosis. Skull: Normal. Negative for fracture or focal lesion. Sinuses/Orbits: Minimal partial opacification of the bilateral maxillary sinuses. Mastoid air cells are clear. Other: None. IMPRESSION: Trace residual left parietal subdural hematoma, as above. Encephalomalacic changes in the left frontal lobe at the site of prior left frontal contusion. Small vessel ischemic changes. Electronically Signed   By: Charline Bills M.D.   On: 03/20/2022 18:13     Catarina Hartshorn, DO  [facility]  If 7PM-7AM, please contact night-coverage [email or web address] Password [email or web address] 03/24/2022, 12:20 PM   LOS: 4 days

## 2022-03-24 NOTE — Plan of Care (Signed)

## 2022-03-24 NOTE — Progress Notes (Signed)
Responded to nursing call:  fever and tachycardia 110-120 I came to bedside to evaluate patient  Subjective: Patient denies fevers, chills, headache, chest pain, dyspnea, nausea, vomiting, diarrhea, abdominal pain, dysuria   Vitals:   03/24/22 1500 03/24/22 1600 03/24/22 1607 03/24/22 1647  BP: (!) 158/79 (!) 157/89    Pulse: (!) 107 (!) 110    Resp: (!) 24 (!) 21 18   Temp:   (!) 102.2 F (39 C) (!) 100.4 F (38 C)  TempSrc:   Oral Axillary  SpO2: 97% 98%    Weight:      Height:       CV--RRR Lung--fine bibasilar crackles. No wheeze Abd--soft+BS/NT   Assessment/Plan: Fever -blood cultures -UA/culture -CXR -lactate -PCT -start IVF -[place] legs r/o DVT (pt complains of thigh pain)     Orson Eva, DO [facility]

## 2022-03-25 ENCOUNTER — Other Ambulatory Visit (HOSPITAL_COMMUNITY): Payer: Self-pay | Admitting: *Deleted

## 2022-03-25 ENCOUNTER — Inpatient Hospital Stay (HOSPITAL_COMMUNITY): Payer: 59

## 2022-03-25 DIAGNOSIS — R569 Unspecified convulsions: Secondary | ICD-10-CM | POA: Diagnosis not present

## 2022-03-25 DIAGNOSIS — F10931 Alcohol use, unspecified with withdrawal delirium: Secondary | ICD-10-CM | POA: Diagnosis not present

## 2022-03-25 DIAGNOSIS — E875 Hyperkalemia: Secondary | ICD-10-CM

## 2022-03-25 DIAGNOSIS — I471 Supraventricular tachycardia, unspecified: Secondary | ICD-10-CM

## 2022-03-25 DIAGNOSIS — I361 Nonrheumatic tricuspid (valve) insufficiency: Secondary | ICD-10-CM

## 2022-03-25 DIAGNOSIS — R509 Fever, unspecified: Secondary | ICD-10-CM | POA: Diagnosis not present

## 2022-03-25 LAB — COMPREHENSIVE METABOLIC PANEL
ALT: 16 U/L (ref 0–44)
AST: 23 U/L (ref 15–41)
Albumin: 2.9 g/dL — ABNORMAL LOW (ref 3.5–5.0)
Alkaline Phosphatase: 94 U/L (ref 38–126)
Anion gap: 6 (ref 5–15)
BUN: 6 mg/dL (ref 6–20)
CO2: 21 mmol/L — ABNORMAL LOW (ref 22–32)
Calcium: 8.6 mg/dL — ABNORMAL LOW (ref 8.9–10.3)
Chloride: 107 mmol/L (ref 98–111)
Creatinine, Ser: 0.69 mg/dL (ref 0.61–1.24)
GFR, Estimated: >60 mL/min (ref >60)
Glucose, Bld: 110 mg/dL — ABNORMAL HIGH (ref 70–99)
Potassium: 5.6 mmol/L — ABNORMAL HIGH (ref 3.5–5.1)
Sodium: 134 mmol/L — ABNORMAL LOW (ref 135–145)
Total Bilirubin: 1 mg/dL (ref 0.3–1.2)
Total Protein: 7 g/dL (ref 6.5–8.1)

## 2022-03-25 LAB — URINE CULTURE: Culture: <10000 — AB

## 2022-03-25 LAB — BASIC METABOLIC PANEL
Anion gap: 8 (ref 5–15)
BUN: 7 mg/dL (ref 6–20)
CO2: 25 mmol/L (ref 22–32)
Calcium: 9 mg/dL (ref 8.9–10.3)
Chloride: 101 mmol/L (ref 98–111)
Creatinine, Ser: 0.78 mg/dL (ref 0.61–1.24)
GFR, Estimated: >60 mL/min (ref >60)
Glucose, Bld: 115 mg/dL — ABNORMAL HIGH (ref 70–99)
Potassium: 4.5 mmol/L (ref 3.5–5.1)
Sodium: 134 mmol/L — ABNORMAL LOW (ref 135–145)

## 2022-03-25 LAB — CBC
HCT: 36 % — ABNORMAL LOW (ref 39.0–52.0)
Hemoglobin: 12.2 g/dL — ABNORMAL LOW (ref 13.0–17.0)
MCH: 34.1 pg — ABNORMAL HIGH (ref 26.0–34.0)
MCHC: 33.9 g/dL (ref 30.0–36.0)
MCV: 100.6 fL — ABNORMAL HIGH (ref 80.0–100.0)
Platelets: 295 10*3/uL (ref 150–400)
RBC: 3.58 MIL/uL — ABNORMAL LOW (ref 4.22–5.81)
RDW: 15.3 % (ref 11.5–15.5)
WBC: 6.4 10*3/uL (ref 4.0–10.5)
nRBC: 0 % (ref 0.0–0.2)

## 2022-03-25 LAB — ECHOCARDIOGRAM COMPLETE
AR max vel: 2.89 cm2
AV Area VTI: 2.7 cm2
AV Area mean vel: 2.82 cm2
AV Mean grad: 2 mmHg
AV Peak grad: 4.4 mmHg
Ao pk vel: 1.05 m/s
Area-P 1/2: 5.58 cm2
Height: 69 in
MV VTI: 3.46 cm2
S' Lateral: 2.9 cm
Weight: 2306.89 oz

## 2022-03-25 LAB — CK: Total CK: 118 U/L (ref 49–397)

## 2022-03-25 LAB — MAGNESIUM: Magnesium: 2.1 mg/dL (ref 1.7–2.4)

## 2022-03-25 MED ORDER — SODIUM CHLORIDE 0.9 % IV SOLN: INTRAVENOUS | Status: DC

## 2022-03-25 MED ORDER — SODIUM ZIRCONIUM CYCLOSILICATE 10 G PO PACK
10.0000 g | PACK | Freq: Once | ORAL | Status: AC
  Administered 2022-03-25: 10 g via ORAL
  Filled 2022-03-25: qty 1

## 2022-03-25 MED ORDER — METOPROLOL TARTRATE 25 MG PO TABS
25.0000 mg | ORAL_TABLET | Freq: Three times a day (TID) | ORAL | Status: DC
  Administered 2022-03-25 – 2022-03-26 (×4): 25 mg via ORAL
  Filled 2022-03-25 (×4): qty 1

## 2022-03-25 NOTE — Progress Notes (Signed)
PROGRESS NOTE  Eric Conley [id number] DOB: 16-Dec-1960 DOA: 03/20/2022 PCP: Sharilyn Sites, MD  Brief History:  61 y.o. male with medical history significant of acid reflux, depression, hypertension, alcohol dependence, subdural hematoma and ,left parietal extra-axial hemorrhage and hemorrhagic contusions in left frontal and temporal lobe in Aug 2023 presents the ED with a chief complaint of seizure.  Patient gives history that he has given to ED staff.  To me, patient reports he came into the ED because he was lightheaded and hungry.  He reports that he is lightheaded probably because he was hungry.  He has had diarrhea in the interval.  His diarrhea is nonbloody and happens 3 times a day.  He has no associated abdominal pain.  He blames some [place] takeout food for his diarrhea.  He reports that the lightheadedness happen when he woke up.  He reports that it was present before he had any postural changes.  He reports that his hands were shaky because he had not eaten.  He reports no loss of consciousness, no loss of time, no falls.  Patient reports that he used to have a history of alcohol dependence but does not anymore.  He reports that his last drink was yesterday.  It was 1 beer.  When asked him that she normally drinks he will not give an answer.  He does report that he only drinks on days when he does not have to work.  When it is brought to his attention that he is has not had to work in several weeks because of his recent trauma, he reports that he has not been drinking has been in the hospital.   In the ED he reported that he had woken up this morning feeling tingly and having intermittent numbness to his left arm.  He reports he woke up with the symptoms today.  Did not get any better throughout the day.  EMS was called and in route to the hospital patient had witnessed tonic-clonic activity which lasted for about a minute.  2-1/2 mg of Versed was given which aborted the  tonic-clonic activity.  Patient was postictal on arrival to the hospital.  Patient does not remember any of this.   Patient does drink and does smoke as discussed above.  He does not use illicit drugs.  He is vaccinated for COVID.  Patient is full code.  Neurology was consulted and patient was started on Keppra.  He did not have any further seizures.  Unfortunately, the patient began developing alcohol withdrawal manifested by increasing agitation.  He was transferred to the ICU and placed on Precedex in the early morning 03/23/2022.  On the evening 10/19-10/20, patient developed fever and SVT.  This prolonged his hospitalization.  He was started on metoprolol and electrolytes were optimized.     Assessment and Plan: * Seizure ([place]) - secondary to intracranial bleed, SDH, hemorrhagic brain contusions -severe hypomagnesemia may have contributed - Neuro consulted and recommending keppra 500 mg BID  - Continue CIWA protocol for alcohol withdrawal  -Continue to monitor and replete magnesium - Seizure precautions - Continue to monitor -no further seizures during hospitalization  Diarrhea - 2 days of nonbloody diarrhea reported PTA - contributed to to electrolyte abnormalities - The patient continues to have diarrhea here we will get stool studies - No abdominal pain presently - resolved.  Tolerating diet  Delirium tremens ([place]) - Family reports that patient has been a heavy  drinker - Patient will not quantify how much he drinks -  his last drink was 10/14 and he had 1 beer per his report - He reports he only drinks on days that he is not working, but he has been out of work due to his recent trauma with subdural hematoma -remained agitated on librium and CIWA -03/23/22 AM--transferred to ICU and started on Precedex -03/24/22 AM--weaned off Precedex -10/19-repeat CT brain--pt complains of ?diplopia  SVT (supraventricular tachycardia) 10/19-10/20--pt developed paroxysms of  SVT -personally reviewed tele>>possible atrial tach -start metoprolol 25 mg po q 8 -echo -10/16 TSH 0.818  Fever Developed fever 10/19 evening -UA--no pyuria -personally reviewed CXR--no infiltrates -blood culture neg to date -lactic 0.8 -PCT <0.10  Hyperkalemia Give lokelma Remove KCl from IVF -repeat BMP later today  Wrist pain - Patient reports right wrist is painful - Slightly swollen compared to left wrist - Patient does not remember injuring it, so the possibility that it was her during seizure activity - X-ray with no acute fractures found, wrist splint ordered for comfort.  - judicious opioids  Tobacco use disorder - Patient reports he smokes 2 or 3 cigarettes a day - nicotine patch ordered  - Counseled on importance of cessation  GERD (gastroesophageal reflux disease) - Continue PPI  Hypocalcemia - Calcium 6.1 upon presentation -Corrects to 6.6 for albumin - Replaced with 1 g of calcium gluconate and tums oral TID - am CMP  Hypomagnesemia - admission Magnesium is undetectable at less than 0.5 -Secondary to poor p.o. intake and GI losses - repleting IV again  Hypokalemia - Potassium is 2.6 upon presentation - Secondary to poor p.o. intake and GI losses - continue to replace, recheck in AM   Family Communication:  no Family at bedside   Consultants:  none   Code Status:  FULL   DVT Prophylaxis: SCDs     Procedures: As Listed in Progress Note Above   Antibiotics: None        Subjective:  Patient denies fevers, chills, headache, chest pain, dyspnea, nausea, vomiting, diarrhea, abdominal pain, dysuria, hematuria, hematochezia, and melena.  Objective: Vitals:   03/25/22 0348 03/25/22 0400 03/25/22 0500 03/25/22 0600  BP: (!) 155/92 (!) 165/91 120/64 127/71  Pulse: (!) 119 (!) 127 (!) 114 (!) 110  Resp: (!) 21 (!) 22 (!) 23 (!) 22  Temp:   99.7 F (37.6 C)   TempSrc:   Oral   SpO2: 97% 99% (!) 82% 98%  Weight:   65.4 kg   Height:    5\' 9"  (1.753 m)     Intake/Output Summary (Last 24 hours) at 03/25/2022 0731 Last data filed at 03/25/2022 0500 Gross per 24 hour  Intake 3031.53 ml  Output 2775 ml  Net 256.53 ml   Weight change:  Exam:  General:  Pt is alert, follows commands appropriately, not in acute distress HEENT: No icterus, No thrush, No neck mass, [place]/AT Cardiovascular: RRR, S1/S2, no rubs, no gallops Respiratory: CTA bilaterally, no wheezing, no crackles, no rhonchi Abdomen: Soft/+BS, non tender, non distended, no guarding Extremities: No edema, No lymphangitis, No petechiae, No rashes, no synovitis   Data Reviewed: I have personally reviewed following labs and imaging studies Basic Metabolic Panel: Recent Labs  Lab 03/20/22 2356 03/21/22 0516 03/22/22 0503 03/23/22 0916 03/24/22 0310 03/25/22 0330  NA 137 138 136 140 138 134*  K 2.7* 2.6* 3.5 3.7 3.5 5.6*  CL 104 107 106 111 106 107  CO2 22 22 22 22  24  21*  GLUCOSE 106* 123* 106* 115* 119* 110*  BUN <5* <5* <5* <5* 6 6  CREATININE 0.73 0.60* 0.60* 0.54* 0.71 0.69  CALCIUM 6.4* 6.9* 8.2* 8.7* 8.3* 8.6*  MG 1.4* 2.4  --  1.3* 1.5* 2.1  PHOS  --   --  2.7  --   --   --    Liver Function Tests: Recent Labs  Lab 03/20/22 1841 03/21/22 0516 03/22/22 0503 03/23/22 0916 03/25/22 0330  AST 26 19  --  17 23  ALT 14 12  --  13 16  ALKPHOS 101 98  --  92 94  BILITOT 0.8 0.9  --  1.0 1.0  PROT 6.7 6.0*  --  6.7 7.0  ALBUMIN 3.4* 3.0* 3.0* 3.1* 2.9*   No results for input(s): "LIPASE", "AMYLASE" in the last 168 hours. No results for input(s): "AMMONIA" in the last 168 hours. Coagulation Profile: Recent Labs  Lab 03/20/22 1841  INR 1.1   CBC: Recent Labs  Lab 03/20/22 1841 03/21/22 0516 03/24/22 0310 03/25/22 0330  WBC 5.2 3.7* 4.9 6.4  NEUTROABS 3.7 2.4  --   --   HGB 13.7 13.1 11.7* 12.2*  HCT 39.7 38.1* 34.7* 36.0*  MCV 97.3 99.2 101.5* 100.6*  PLT 208 202 245 295   Cardiac Enzymes: Recent Labs  Lab 03/25/22 0330   CKTOTAL 118   BNP: Invalid input(s): "POCBNP" CBG: Recent Labs  Lab 03/20/22 1749 03/20/22 1852  GLUCAP 133* 115*   HbA1C: No results for input(s): "HGBA1C" in the last 72 hours. Urine analysis:    Component Value Date/Time   COLORURINE YELLOW 03/24/2022 1718   APPEARANCEUR CLEAR 03/24/2022 1718   LABSPEC 1.013 03/24/2022 1718   PHURINE 9.0 (H) 03/24/2022 1718   GLUCOSEU 50 (A) 03/24/2022 1718   HGBUR NEGATIVE 03/24/2022 1718   BILIRUBINUR NEGATIVE 03/24/2022 1718   KETONESUR NEGATIVE 03/24/2022 1718   PROTEINUR NEGATIVE 03/24/2022 1718   NITRITE NEGATIVE 03/24/2022 1718   LEUKOCYTESUR NEGATIVE 03/24/2022 1718   Sepsis Labs: @LABRCNTIP (procalcitonin:4,lacticidven:4) ) Recent Results (from the past 240 hour(s))  MRSA Next Gen by PCR, Nasal     Status: None   Collection Time: 03/22/22 11:40 PM   Specimen: Nasal Mucosa; Nasal Swab  Result Value Ref Range Status   MRSA by PCR Next Gen NOT DETECTED NOT DETECTED Final    Comment: (NOTE) The GeneXpert MRSA Assay (FDA approved for NASAL specimens only), is one component of a comprehensive MRSA colonization surveillance program. It is not intended to diagnose MRSA infection nor to guide or monitor treatment for MRSA infections. Test performance is not FDA approved in patients less than 59 years old. Performed at [facility], [address] [place], [place] [address]   Culture, blood (Routine X 2) w Reflex to ID Panel     Status: None (Preliminary result)   Collection Time: 03/24/22  5:52 PM   Specimen: BLOOD  Result Value Ref Range Status   Specimen Description BLOOD LEFT ANTECUBITAL  Final   Special Requests   Final    BOTTLES DRAWN AEROBIC AND ANAEROBIC Blood Culture adequate volume   Culture   Final    NO GROWTH < 24 HOURS Performed at [facility], [address] [place], [place] [address]    Report Status PENDING  Incomplete  Culture, blood (Routine X 2) w Reflex to ID Panel     Status: None (Preliminary  result)   Collection Time: 03/24/22  5:52 PM   Specimen: BLOOD  Result  Value Ref Range Status   Specimen Description BLOOD LEFT WRIST  Final   Special Requests   Final    BOTTLES DRAWN AEROBIC AND ANAEROBIC Blood Culture results may not be optimal due to an excessive volume of blood received in culture bottles   Culture   Final    NO GROWTH < 24 HOURS Performed at [facility], [address] [place], [place] [address]    Report Status PENDING  Incomplete     Scheduled Meds:  calcium carbonate  800 mg of elemental calcium Oral TID WC   chlordiazePOXIDE  25 mg Oral TID   Chlorhexidine Gluconate Cloth  6 each Topical Daily   folic acid  1 mg Oral Daily   levETIRAcetam  500 mg Oral BID   metoprolol tartrate  25 mg Oral Q8H   multivitamin with minerals  1 tablet Oral Once   nicotine  21 mg Transdermal Daily   pantoprazole  40 mg Oral Daily   sodium zirconium cyclosilicate  10 g Oral Once   thiamine  100 mg Oral Daily   Or   thiamine  100 mg Intravenous Daily   Continuous Infusions:  sodium chloride     dexmedetomidine (PRECEDEX) IV infusion Stopped (03/24/22 0930)    Procedures/Studies: DG CHEST PORT 1 VIEW  Result Date: 03/24/2022 CLINICAL DATA:  Fever and cough. EXAM: PORTABLE CHEST 1 VIEW COMPARISON:  Chest radiograph dated 01/24/2022 and CT dated 01/25/2019. FINDINGS: Mild centrilobular emphysema. No focal consolidation, pleural effusion, or pneumothorax. The cardiac silhouette is within normal limits. No acute osseous pathology. IMPRESSION: No active disease. Electronically Signed   By: Anner Crete M.D.   On: 03/24/2022 18:52   CT HEAD WO CONTRAST (5MM)  Result Date: 03/24/2022 CLINICAL DATA:  Recent subdural hematoma.  Double vision. EXAM: CT HEAD WITHOUT CONTRAST TECHNIQUE: Contiguous axial images were obtained from the base of the skull through the vertex without intravenous contrast. RADIATION DOSE REDUCTION: This exam was performed according to the departmental  dose-optimization program which includes automated exposure control, adjustment of the mA and/or kV according to patient size and/or use of iterative reconstruction technique. COMPARISON:  Brain MRI from 3 days ago FINDINGS: Brain: Left inferior and anterior temporal encephalomalacia, there is history of cerebral contusions. Motion artifact especially affecting the surface of the brain but still visible and stable subdural hematoma along the left parietal convexity measuring 3 mm in thickness, see 4: 54. No hydrocephalus or masslike finding Vascular: No hyperdense vessel or unexpected calcification. Skull: Normal. Negative for fracture or focal lesion. Sinuses/Orbits: No acute finding. IMPRESSION: 1. Unchanged trace residual subdural hematoma along the left parietal convexity, up to 3 mm in thickness. 2. Sequela of anterior left frontal and temporal contusions. Electronically Signed   By: Jorje Guild M.D.   On: 03/24/2022 13:20   MR CERVICAL SPINE WO CONTRAST  Result Date: 03/21/2022 CLINICAL DATA:  Paresthesias, dizziness, cervical radiculopathy EXAM: MRI CERVICAL AND THORACIC SPINE WITHOUT CONTRAST TECHNIQUE: Multiplanar and multiecho pulse sequences of the cervical spine, to include the craniocervical junction and cervicothoracic junction, and the thoracic spine, were obtained without intravenous contrast. COMPARISON:  No prior MRI of the cervical or thoracic spine, correlation is made with 01/24/2022 CT cervical spine FINDINGS: MRI CERVICAL SPINE FINDINGS Evaluation is somewhat limited by motion artifact. Alignment: No listhesis. Vertebrae: No fracture, evidence of discitis, or bone lesion. Cord: Normal signal and morphology. Posterior Fossa, vertebral arteries, paraspinal tissues: Negative. Disc levels: C2-C3: No significant disc bulge. No spinal canal  stenosis or neuroforaminal narrowing. C3-C4: No significant disc bulge. No spinal canal stenosis or neuroforaminal narrowing. C4-C5: Disc height loss  and mild disc bulge. Facet and uncovertebral hypertrophy, right-greater-than-left. No spinal canal stenosis. Mild-to-moderate right neural foraminal narrowing. C5-C6: Disc height loss and mild disc bulge. Facet and uncovertebral hypertrophy. No spinal canal stenosis. Mild bilateral neural foraminal narrowing. C6-C7: Disc height loss and mild disc bulge with disc osteophyte complex. Facet and uncovertebral hypertrophy. No spinal canal stenosis. Mild bilateral neural foraminal narrowing. C7-T1: No significant disc bulge. No spinal canal stenosis or neuroforaminal narrowing. MRI THORACIC SPINE FINDINGS Alignment: No listhesis. Preservation of the normal thoracic kyphosis. Vertebrae: No fracture, evidence of discitis, or bone lesion. Cord:  Normal signal and morphology. Paraspinal and other soft tissues: Trace pleural effusions. Disc levels: No significant spinal canal stenosis or neural foraminal narrowing. IMPRESSION: 1. C4-C5 mild-to-moderate right neural foraminal narrowing. 2. C5-C6 and C6-C7 mild bilateral neural foraminal narrowing. 3. No spinal canal stenosis in the cervical spine. 4. No significant spinal canal stenosis or neural foraminal narrowing in the thoracic spine. 5. Trace pleural effusions. Electronically Signed   By: Wiliam Ke M.D.   On: 03/21/2022 18:44   MR THORACIC SPINE WO CONTRAST  Result Date: 03/21/2022 CLINICAL DATA:  Paresthesias, dizziness, cervical radiculopathy EXAM: MRI CERVICAL AND THORACIC SPINE WITHOUT CONTRAST TECHNIQUE: Multiplanar and multiecho pulse sequences of the cervical spine, to include the craniocervical junction and cervicothoracic junction, and the thoracic spine, were obtained without intravenous contrast. COMPARISON:  No prior MRI of the cervical or thoracic spine, correlation is made with 01/24/2022 CT cervical spine FINDINGS: MRI CERVICAL SPINE FINDINGS Evaluation is somewhat limited by motion artifact. Alignment: No listhesis. Vertebrae: No fracture, evidence  of discitis, or bone lesion. Cord: Normal signal and morphology. Posterior Fossa, vertebral arteries, paraspinal tissues: Negative. Disc levels: C2-C3: No significant disc bulge. No spinal canal stenosis or neuroforaminal narrowing. C3-C4: No significant disc bulge. No spinal canal stenosis or neuroforaminal narrowing. C4-C5: Disc height loss and mild disc bulge. Facet and uncovertebral hypertrophy, right-greater-than-left. No spinal canal stenosis. Mild-to-moderate right neural foraminal narrowing. C5-C6: Disc height loss and mild disc bulge. Facet and uncovertebral hypertrophy. No spinal canal stenosis. Mild bilateral neural foraminal narrowing. C6-C7: Disc height loss and mild disc bulge with disc osteophyte complex. Facet and uncovertebral hypertrophy. No spinal canal stenosis. Mild bilateral neural foraminal narrowing. C7-T1: No significant disc bulge. No spinal canal stenosis or neuroforaminal narrowing. MRI THORACIC SPINE FINDINGS Alignment: No listhesis. Preservation of the normal thoracic kyphosis. Vertebrae: No fracture, evidence of discitis, or bone lesion. Cord:  Normal signal and morphology. Paraspinal and other soft tissues: Trace pleural effusions. Disc levels: No significant spinal canal stenosis or neural foraminal narrowing. IMPRESSION: 1. C4-C5 mild-to-moderate right neural foraminal narrowing. 2. C5-C6 and C6-C7 mild bilateral neural foraminal narrowing. 3. No spinal canal stenosis in the cervical spine. 4. No significant spinal canal stenosis or neural foraminal narrowing in the thoracic spine. 5. Trace pleural effusions. Electronically Signed   By: Wiliam Ke M.D.   On: 03/21/2022 18:44   MR BRAIN WO CONTRAST  Result Date: 03/21/2022 CLINICAL DATA:  Paresthesias, dizziness, could be secondary to fall induced neuropathy or spinal stenosis. Concern for seizure. EXAM: MRI HEAD WITHOUT CONTRAST TECHNIQUE: Multiplanar, multiecho pulse sequences of the brain and surrounding structures were  obtained without intravenous contrast. COMPARISON:  No prior MRI, correlation is made with head CT 03/20/2022 FINDINGS: Evaluation is somewhat limited by motion artifact. Brain: No restricted diffusion to suggest acute or  subacute infarct. No acute hemorrhage, mass, mass effect, or midline shift. No hydrocephalus. 5 mm residual left parietal subdural hematoma (series 18, image 7), as seen on the recent CT. No new extra-axial collection. Remote right posterior lentiform nucleus lacunar infarct. Encephalomalacia in the left anteromedial temporal lobe (series 15, image 12) and left inferior frontal lobe (series 15, images 19 and 22), likely the sequela of prior trauma. These areas are associated with hemosiderin deposition. The hippocampi are symmetric in size and signal. No heterotopia or evidence of cortical dysgenesis. Vascular: Normal arterial flow voids. Skull and upper cervical spine: Normal marrow signal. Sinuses/Orbits: Mucosal thickening in the maxillary sinuses and ethmoid air cells. The orbits are unremarkable. Other: Trace fluid in left mastoid air cells. IMPRESSION: 1. No acute intracranial process. No acute or subacute infarct. No seizure etiology identified. 2. 5 mm residual left parietal subdural hematoma, as seen on the recent CT. 3. Encephalomalacia in the left anteromedial temporal lobe and left inferior frontal lobe, likely the sequela of prior trauma. Electronically Signed   By: Wiliam Ke M.D.   On: 03/21/2022 18:20   DG Wrist 2 Views Right  Result Date: 03/20/2022 CLINICAL DATA:  Wrist pain. EXAM: RIGHT WRIST - 2 VIEW COMPARISON:  None Available. FINDINGS: There is soft tissue swelling surrounding the wrist. There is no acute fracture or dislocation. There is mild radiocarpal joint space narrowing. IMPRESSION: 1. No acute fracture or dislocation. 2. Soft tissue swelling surrounding the wrist. Electronically Signed   By: Darliss Cheney M.D.   On: 03/20/2022 23:04   CT HEAD WO  CONTRAST  Result Date: 03/20/2022 CLINICAL DATA:  New onset seizure, recent trauma with subdural hematoma. EXAM: CT HEAD WITHOUT CONTRAST TECHNIQUE: Contiguous axial images were obtained from the base of the skull through the vertex without intravenous contrast. RADIATION DOSE REDUCTION: This exam was performed according to the departmental dose-optimization program which includes automated exposure control, adjustment of the mA and/or kV according to patient size and/or use of iterative reconstruction technique. COMPARISON:  01/25/2022 FINDINGS: Brain: No evidence of acute infarction, hydrocephalus, extra-axial collection or mass lesion/mass effect. Trace residual left parietal subdural hematoma (coronal image 52), measuring 5 mm in thickness, previously 19 mm. Encephalomalacic changes in the left frontal lobe (series 2/image 11). Prior left frontal contusion with subarachnoid hemorrhage has resolved. Subcortical white matter and periventricular small vessel ischemic changes. Vascular: Intracranial atherosclerosis. Skull: Normal. Negative for fracture or focal lesion. Sinuses/Orbits: Minimal partial opacification of the bilateral maxillary sinuses. Mastoid air cells are clear. Other: None. IMPRESSION: Trace residual left parietal subdural hematoma, as above. Encephalomalacic changes in the left frontal lobe at the site of prior left frontal contusion. Small vessel ischemic changes. Electronically Signed   By: Charline Bills M.D.   On: 03/20/2022 18:13    Catarina Hartshorn, DO  [facility]  If 7PM-7AM, please contact night-coverage [email or web address] Password [email or web address] 03/25/2022, 7:31 AM   LOS: 5 days

## 2022-03-25 NOTE — TOC Initial Note (Signed)
Transition of Care [facility]) - Initial/Assessment Note    Patient Details  Name: Eric Conley MRN: [id number] Date of Birth: 1961-05-17  Transition of Care [facility]) CM/SW Contact:    Ihor Gully, LCSW Phone Number: 03/25/2022, 1:49 PM  Clinical Narrative:                 Patient from home alone. Independent at baseline. Admitted for Seizure. D/c plan discussed with sister in law, Malachy Mood. Discussed PT recommendation for SNF. Malachy Mood advised that patient had previously stay with her and his brother for 10 days while recovering and he will likely d/c home with them with [facility].  [facility] is accepting of referral.  Expected Discharge Plan: [place] Barriers to Discharge: Continued Medical Work up   Patient Goals and CMS Choice        Expected Discharge Plan and Services Expected Discharge Plan: [place]       Living arrangements for the past 2 months: Single Family Home                           HH Arranged: PT HH Agency: [place] Date [facility] Agency Contacted: 03/25/22 Time HH Agency Contacted: 33 Representative spoke with at [place]: Tommi Rumps  Prior Living Arrangements/Services Living arrangements for the past 2 months: [place] Lives with:: Self Patient language and need for interpreter reviewed:: Yes                 Activities of Daily Living Home Assistive Devices/Equipment: None ADL Screening (condition at time of admission) Patient's cognitive ability adequate to safely complete daily activities?: Yes Is the patient deaf or have difficulty hearing?: No Does the patient have difficulty seeing, even when wearing glasses/contacts?: No Does the patient have difficulty concentrating, remembering, or making decisions?: Yes Patient able to express need for assistance with ADLs?: Yes Does the patient have difficulty dressing or bathing?: Yes Independently performs ADLs?: Yes (appropriate for developmental  age) Does the patient have difficulty walking or climbing stairs?: Yes Weakness of Legs: Both Weakness of Arms/Hands: None  Permission Sought/Granted                  Emotional Assessment         Alcohol / Substance Use: Not Applicable Psych Involvement: No (comment)  Admission diagnosis:  Hypocalcemia [E83.51] Hypokalemia [E87.6] Hypomagnesemia [E83.42] Seizure ([place]) [R56.9] Alcohol withdrawal seizure with complication ([place]) [id number], R56.9] Patient Active Problem List   Diagnosis Date Noted   SVT (supraventricular tachycardia) 03/25/2022   Hyperkalemia 03/25/2022   Fever 03/25/2022   Delirium tremens ([place]) 03/23/2022   Alcohol withdrawal seizure with complication ([place])    Seizure ([place]) 03/20/2022   Hypokalemia 03/20/2022   Hypomagnesemia 03/20/2022   Hypocalcemia 03/20/2022   GERD (gastroesophageal reflux disease) 03/20/2022   Tobacco use disorder 03/20/2022   Wrist pain 03/20/2022   Epidural hematoma ([place]) 01/24/2022   SDH (subdural hematoma) ([place]) 01/24/2022   Severe sepsis ([place]) 12/30/2021   Hyponatremia 01/25/2021   ARF (acute renal failure) ([place]) 01/25/2021    Class: Acute   Diarrhea 01/25/2021    Class: Acute   Dehydration 01/25/2021   Hypotensive episode 01/25/2021    Class: Acute   Essential hypertension 01/25/2021   Severe recurrent major depression without psychotic features ([place]) 01/11/2021   Suicidal thoughts    PCP:  Sharilyn Sites, MD Pharmacy:   [place], [place] -  [address] [place] [address] Phone: [phone] Fax: [phone]  [place], [place] [address] Phone: [phone] Fax: [place], [place]. [address] [place] [address] Phone: [phone] Fax: [phone]     Social Determinants of Health (SDOH)  Interventions    Readmission Risk Interventions     No data to display

## 2022-03-25 NOTE — Evaluation (Signed)
Physical Therapy Evaluation Patient Details Name: Eric Conley MRN: [id number] DOB: 1961-02-06 Today's Date: 03/25/2022  History of Present Illness  Eric Conley is a 61 y.o. male with medical history significant of acid reflux, depression, hypertension, alcohol dependence, subdural hematoma from 6 weeks ago, and more presents the ED with a chief complaint of seizure.  Patient gives history that he has given to ED staff.  To me, patient reports he came into the ED because he was lightheaded and hungry.  He reports that he is lightheaded probably because he was hungry.  His last normal meal was 2 or 3 days ago.  He has had diarrhea in the interval.  His diarrhea is nonbloody and happens 3 times a day.  He has no associated abdominal pain.  He blames some [place] takeout food for his diarrhea.  He reports that the lightheadedness happen when he woke up.  He reports that it was present before he had any postural changes.  He reports that his hands were shaky because he had not eaten.  He reports no loss of consciousness, no loss of time, no falls.  Patient reports that he used to have a history of alcohol dependence but does not anymore.  He reports that his last drink was yesterday.  It was 1 beer.  When asked him that she normally drinks he will not give an answer.  He does report that he only drinks on days when he does not have to work.  When it is brought to his attention that he is has not had to work in several weeks because of his recent trauma, he reports that he has not been drinking has been in the hospital.   Clinical Impression  Patient demonstrates labored movement for bed mobility requiring hand held assist for elevating trunk and min assist for scooting to EOB. Patient unsteady on feet for transfer to chair having to lean on armrest, safer with RW. Patient able to ambulate in room with RW/min assist, unable to make sharp turns requiring tactile cueing for walker placement with fair/good  carryover. Patient tolerated sitting up in chair after therapy. Patient will benefit from continued skilled physical therapy in hospital and recommended venue below to increase strength, balance, endurance for safe ADLs and gait.     Recommendations for follow up therapy are one component of a multi-disciplinary discharge planning process, led by the attending physician.  Recommendations may be updated based on patient status, additional functional criteria and insurance authorization.  Follow Up Recommendations Skilled nursing-short term rehab (<3 hours/day) Can patient physically be transported by private vehicle: Yes    Assistance Recommended at Discharge Set up Supervision/Assistance  Patient can return home with the following  Help with stairs or ramp for entrance;Assistance with cooking/housework;A lot of help with walking and/or transfers;A lot of help with bathing/dressing/bathroom    Equipment Recommendations None recommended by PT  Recommendations for Other Services       Functional Status Assessment Patient has had a recent decline in their functional status and demonstrates the ability to make significant improvements in function in a reasonable and predictable amount of time.     Precautions / Restrictions Precautions Precautions: Fall Restrictions Weight Bearing Restrictions: No      Mobility  Bed Mobility Overal bed mobility: Needs Assistance Bed Mobility: Supine to Sit     Supine to sit: Min assist     General bed mobility comments: patient with labored movement, required hand held assist for elevating  trunk, use of bedrails, and min assist for scooting to EOB    Transfers Overall transfer level: Needs assistance Equipment used: None Transfers: Sit to/from Stand, Bed to chair/wheelchair/BSC Sit to Stand: Min assist   Step pivot transfers: Mod assist       General transfer comment: patient demonstrates labored movement for sit to stand w/ use of chair  armrest, is unsteady on feet requiring mod assist for side stepping towards chair    Ambulation/Gait Ambulation/Gait assistance: Min assist Gait Distance (Feet): 25 Feet Assistive device: Rolling walker (2 wheels) Gait Pattern/deviations: Decreased step length - right, Decreased step length - left, Decreased stride length Gait velocity: decreased     General Gait Details: patient slightly unsteady on feet able to ambulate in room with RW, takes wide turns with RW requiring tactile cueing for walker placement  Stairs            Wheelchair Mobility    Modified Rankin (Stroke Patients Only)       Balance Overall balance assessment: Needs assistance Sitting-balance support: Feet supported, Bilateral upper extremity supported Sitting balance-Leahy Scale: Good Sitting balance - Comments: fair/good seated EOB   Standing balance support: Bilateral upper extremity supported, No upper extremity supported, During functional activity, Reliant on assistive device for balance Standing balance-Leahy Scale: Fair Standing balance comment: fair/poor without use of AD, fair with RW                             Pertinent Vitals/Pain Pain Assessment Pain Assessment: 0-10 Pain Score: 5  Pain Location: bilateral legs, feet Pain Descriptors / Indicators: Grimacing, Sore Pain Intervention(s): Limited activity within patient's tolerance, Monitored during session, Repositioned    Home Living Family/patient expects to be discharged to:: Private residence Living Arrangements: Alone Available Help at Discharge: Friend(s);Available PRN/intermittently;Neighbor Type of Home: House Home Access: Ramped entrance     Alternate Level Stairs-Number of Steps: 10 Home Layout: Two level Home Equipment: [place];Wheelchair - Publishing copy (2 wheels) Additional Comments: 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[employer], chartered, currently off due to [place]    Prior Function Prior Level of  Function : Independent/Modified Independent             Mobility Comments: community ambulator without AD, drives, works ADLs Comments: Independent     Hand Dominance   Dominant Hand: Right    Extremity/Trunk Assessment   Upper Extremity Assessment Upper Extremity Assessment: Generalized weakness    Lower Extremity Assessment Lower Extremity Assessment: Generalized weakness    Cervical / Trunk Assessment Cervical / Trunk Assessment: Normal  Communication   Communication: No difficulties  Cognition Arousal/Alertness: Awake/alert Behavior During Therapy: WFL for tasks assessed/performed Overall Cognitive Status: Within Functional Limits for tasks assessed                                          General Comments      Exercises     Assessment/Plan    PT Assessment Patient needs continued PT services  PT Problem List Decreased strength;Decreased activity tolerance;Decreased balance;Decreased mobility       PT Treatment Interventions DME instruction;Balance training;Gait training;Stair training;Functional mobility training;Patient/family education;Therapeutic activities;Therapeutic exercise    PT Goals (Current goals can be found in the Care Plan section)  Acute Rehab PT Goals Patient Stated Goal: return home after rehab PT Goal Formulation: With patient Time  For Goal Achievement: 04/08/22 Potential to Achieve Goals: Good    Frequency Min 3X/week     Co-evaluation               AM-PAC PT "6 Clicks" Mobility  Outcome Measure Help needed turning from your back to your side while in a flat bed without using bedrails?: A Little Help needed moving from lying on your back to sitting on the side of a flat bed without using bedrails?: A Lot Help needed moving to and from a bed to a chair (including a wheelchair)?: A Lot Help needed standing up from a chair using your arms (e.g., wheelchair or bedside chair)?: A Little Help needed to walk  in hospital room?: A Little Help needed climbing 3-5 steps with a railing? : A Lot 6 Click Score: 15    End of Session Equipment Utilized During Treatment: Gait belt Activity Tolerance: Patient tolerated treatment well;Patient limited by fatigue Patient left: in chair;with chair alarm set;with call bell/phone within reach Nurse Communication: Mobility status PT Visit Diagnosis: Unsteadiness on feet (R26.81);Other abnormalities of gait and mobility (R26.89);Muscle weakness (generalized) (M62.81)    Time: [id number] PT Time Calculation (min) (ACUTE ONLY): 27 min   Charges:   PT Evaluation $PT Eval Moderate Complexity: 1 Mod PT Treatments $Therapeutic Activity: 23-37 mins        Zigmund Gottron, SPT

## 2022-03-25 NOTE — Progress Notes (Signed)
*  PRELIMINARY RESULTS* Echocardiogram 2D Echocardiogram has been performed.  Eric Conley 03/25/2022, 9:36 AM

## 2022-03-25 NOTE — Assessment & Plan Note (Signed)
Treated -repeat BMP>>resolved

## 2022-03-25 NOTE — Assessment & Plan Note (Signed)
Developed fever 10/19 evening -UA--no pyuria -personally reviewed CXR--no infiltrates -blood culture neg to date -lactic 0.8 -PCT <0.10 -resolved

## 2022-03-25 NOTE — Plan of Care (Addendum)
  Problem: Acute Rehab PT Goals(only PT should resolve) Goal: Pt Will Go Supine/Side To Sit Outcome: Progressing Flowsheets (Taken 03/25/2022 1153) Pt will go Supine/Side to Sit: with min guard assist Goal: Patient Will Transfer Sit To/From Stand Outcome: Progressing Flowsheets (Taken 03/25/2022 1153) Patient will transfer sit to/from stand: with min guard assist Goal: Pt Will Transfer Bed To Chair/Chair To Bed Outcome: Progressing Flowsheets (Taken 03/25/2022 1153) Pt will Transfer Bed to Chair/Chair to Bed: with min assist Goal: Pt Will Ambulate Outcome: Progressing Flowsheets (Taken 03/25/2022 1153) Pt will Ambulate:  75 feet  with least restrictive assistive device  with min guard assist   [employer], SPT

## 2022-03-25 NOTE — Assessment & Plan Note (Signed)
10/19-10/20--pt developed paroxysms of SVT -personally reviewed tele>>possible atrial tach/MAT -started metoprolol>>increase to 75 mg bid -started cardizem CD 120 mg daily -echo EF 55-60%, no WMA, normal RVF -10/16 TSH 0.818

## 2022-03-25 NOTE — Progress Notes (Signed)
EKG done and paper result given to Dr Tat.

## 2022-03-26 DIAGNOSIS — R569 Unspecified convulsions: Secondary | ICD-10-CM | POA: Diagnosis not present

## 2022-03-26 DIAGNOSIS — F10931 Alcohol use, unspecified with withdrawal delirium: Secondary | ICD-10-CM | POA: Diagnosis not present

## 2022-03-26 DIAGNOSIS — I471 Supraventricular tachycardia, unspecified: Secondary | ICD-10-CM | POA: Diagnosis not present

## 2022-03-26 LAB — BASIC METABOLIC PANEL
Anion gap: 10 (ref 5–15)
BUN: 7 mg/dL (ref 6–20)
CO2: 21 mmol/L — ABNORMAL LOW (ref 22–32)
Calcium: 9.1 mg/dL (ref 8.9–10.3)
Chloride: 106 mmol/L (ref 98–111)
Creatinine, Ser: 0.57 mg/dL — ABNORMAL LOW (ref 0.61–1.24)
GFR, Estimated: >60 mL/min (ref >60)
Glucose, Bld: 110 mg/dL — ABNORMAL HIGH (ref 70–99)
Potassium: 3.8 mmol/L (ref 3.5–5.1)
Sodium: 137 mmol/L (ref 135–145)

## 2022-03-26 LAB — MAGNESIUM: Magnesium: 1.6 mg/dL — ABNORMAL LOW (ref 1.7–2.4)

## 2022-03-26 MED ORDER — MAGNESIUM SULFATE 2 GM/50ML IV SOLN
2.0000 g | Freq: Once | INTRAVENOUS | Status: AC
  Administered 2022-03-26: 2 g via INTRAVENOUS
  Filled 2022-03-26: qty 50

## 2022-03-26 MED ORDER — METOPROLOL TARTRATE 50 MG PO TABS
50.0000 mg | ORAL_TABLET | Freq: Two times a day (BID) | ORAL | Status: DC
  Administered 2022-03-26: 50 mg via ORAL
  Filled 2022-03-26: qty 1

## 2022-03-26 MED ORDER — METOPROLOL TARTRATE 50 MG PO TABS: 50.0000 mg | ORAL_TABLET | Freq: Once | ORAL | Status: DC

## 2022-03-26 MED ORDER — MAGNESIUM OXIDE -MG SUPPLEMENT 400 (240 MG) MG PO TABS
400.0000 mg | ORAL_TABLET | Freq: Two times a day (BID) | ORAL | Status: DC
  Administered 2022-03-26 – 2022-03-30 (×9): 400 mg via ORAL
  Filled 2022-03-26 (×9): qty 1

## 2022-03-26 MED ORDER — METOPROLOL TARTRATE 50 MG PO TABS
50.0000 mg | ORAL_TABLET | Freq: Once | ORAL | Status: AC
  Administered 2022-03-26: 50 mg via ORAL
  Filled 2022-03-26: qty 1

## 2022-03-26 NOTE — Progress Notes (Signed)
PROGRESS NOTE  Eric Conley [id number] DOB: 16-Dec-1960 DOA: 03/20/2022 PCP: Sharilyn Sites, MD  Brief History:  61 y.o. male with medical history significant of acid reflux, depression, hypertension, alcohol dependence, subdural hematoma and ,left parietal extra-axial hemorrhage and hemorrhagic contusions in left frontal and temporal lobe in Aug 2023 presents the ED with a chief complaint of seizure.  Patient gives history that he has given to ED staff.  To me, patient reports he came into the ED because he was lightheaded and hungry.  He reports that he is lightheaded probably because he was hungry.  He has had diarrhea in the interval.  His diarrhea is nonbloody and happens 3 times a day.  He has no associated abdominal pain.  He blames some [place] takeout food for his diarrhea.  He reports that the lightheadedness happen when he woke up.  He reports that it was present before he had any postural changes.  He reports that his hands were shaky because he had not eaten.  He reports no loss of consciousness, no loss of time, no falls.  Patient reports that he used to have a history of alcohol dependence but does not anymore.  He reports that his last drink was yesterday.  It was 1 beer.  When asked him that she normally drinks he will not give an answer.  He does report that he only drinks on days when he does not have to work.  When it is brought to his attention that he is has not had to work in several weeks because of his recent trauma, he reports that he has not been drinking has been in the hospital.   In the ED he reported that he had woken up this morning feeling tingly and having intermittent numbness to his left arm.  He reports he woke up with the symptoms today.  Did not get any better throughout the day.  EMS was called and in route to the hospital patient had witnessed tonic-clonic activity which lasted for about a minute.  2-1/2 mg of Versed was given which aborted the  tonic-clonic activity.  Patient was postictal on arrival to the hospital.  Patient does not remember any of this.   Patient does drink and does smoke as discussed above.  He does not use illicit drugs.  He is vaccinated for COVID.  Patient is full code.  Neurology was consulted and patient was started on Keppra.  He did not have any further seizures.  Unfortunately, the patient began developing alcohol withdrawal manifested by increasing agitation.  He was transferred to the ICU and placed on Precedex in the early morning 03/23/2022.  On the evening 10/19-10/20, patient developed fever and SVT.  This prolonged his hospitalization.  He was started on metoprolol and electrolytes were optimized.     Assessment and Plan: * Seizure ([place]) - secondary to intracranial bleed, SDH, hemorrhagic brain contusions -severe hypomagnesemia may have contributed - Neuro consulted and recommending keppra 500 mg BID  - Continue CIWA protocol for alcohol withdrawal  -Continue to monitor and replete magnesium - Seizure precautions - Continue to monitor -no further seizures during hospitalization  Diarrhea - 2 days of nonbloody diarrhea reported PTA - contributed to to electrolyte abnormalities - The patient continues to have diarrhea here we will get stool studies - No abdominal pain presently - resolved.  Tolerating diet  Delirium tremens ([place]) - Family reports that patient has been a heavy  drinker - Patient will not quantify how much he drinks -  his last drink was 10/14 and he had 1 beer per his report - He reports he only drinks on days that he is not working, but he has been out of work due to his recent trauma with subdural hematoma -remained agitated on librium and CIWA -03/23/22 AM--transferred to ICU and started on Precedex -03/24/22 AM--weaned off Precedex -10/19-repeat CT brain--pt complains of ?diplopia>>>Unchanged trace residual subdural hematoma along the left parietal convexity  SVT  (supraventricular tachycardia) 10/19-10/20--pt developed paroxysms of SVT -personally reviewed tele>>possible atrial tach -started metoprolol>>increase to 50 mg bid -echo EF 55-60%, no WMA, normal RVF -10/16 TSH 0.818  Fever Developed fever 10/19 evening -UA--no pyuria -personally reviewed CXR--no infiltrates -blood culture neg to date -lactic 0.8 -PCT <0.10 -no further fevers x 48 hours  Hyperkalemia Give lokelma Remove KCl from IVF -repeat BMP later today  Wrist pain - Patient reports right wrist is painful - Slightly swollen compared to left wrist - Patient does not remember injuring it, so the possibility that it was her during seizure activity - X-ray with no acute fractures found, wrist splint ordered for comfort.  - judicious opioids  Tobacco use disorder - Patient reports he smokes 2 or 3 cigarettes a day - nicotine patch ordered  - Counseled on importance of cessation  GERD (gastroesophageal reflux disease) - Continue PPI  Hypocalcemia - Calcium 6.1 upon presentation -Corrects to 6.6 for albumin - Replaced with 1 g of calcium gluconate and tums oral TID - am CMP  Hypomagnesemia - admission Magnesium is undetectable at less than 0.5 -Secondary to poor p.o. intake and GI losses - repleting IV again - start daily po mag ox  Hypokalemia - Potassium is 2.6 upon presentation - Secondary to poor p.o. intake and GI losses - continue to replace, recheck in AM   Family Communication:  sister-in-law updated 10/21   Consultants:  none   Code Status:  FULL   DVT Prophylaxis: SCDs     Procedures: As Listed in Progress Note Above   Antibiotics: None        Subjective: Patient denies fevers, chills, headache, chest pain, dyspnea, nausea, vomiting, diarrhea, abdominal pain, dysuria, hematuria, hematochezia, and melena.   Objective: Vitals:   03/26/22 1000 03/26/22 1100 03/26/22 1200 03/26/22 1300  BP: 134/80 128/75 (!) 138/99 125/72  Pulse:  (!) 107 (!) 108 (!) 108 98  Resp: (!) 24 (!) 22 17 (!) 21  Temp:   98.3 F (36.8 C)   TempSrc:   Oral   SpO2: 99% 97% 100% 97%  Weight:      Height:        Intake/Output Summary (Last 24 hours) at 03/26/2022 1438 Last data filed at 03/26/2022 1300 Gross per 24 hour  Intake 1678.72 ml  Output 2850 ml  Net -1171.28 ml   Weight change:  Exam:  General:  Pt is alert, follows commands appropriately, not in acute distress HEENT: No icterus, No thrush, No neck mass, [place]/AT Cardiovascular: RRR, S1/S2, no rubs, no gallops Respiratory: CTA bilaterally, no wheezing, no crackles, no rhonchi Abdomen: Soft/+BS, non tender, non distended, no guarding Extremities: No edema, No lymphangitis, No petechiae, No rashes, no synovitis   Data Reviewed: I have personally reviewed following labs and imaging studies Basic Metabolic Panel: Recent Labs  Lab 03/21/22 0516 03/22/22 0503 03/23/22 0916 03/24/22 0310 03/25/22 0330 03/25/22 1352 03/26/22 0416  NA 138 136 140 138 134* 134* 137  K 2.6*  3.5 3.7 3.5 5.6* 4.5 3.8  CL 107 106 111 106 107 101 106  CO2 22 22 22 24  21* 25 21*  GLUCOSE 123* 106* 115* 119* 110* 115* 110*  BUN <5* <5* <5* 6 6 7 7   CREATININE 0.60* 0.60* 0.54* 0.71 0.69 0.78 0.57*  CALCIUM 6.9* 8.2* 8.7* 8.3* 8.6* 9.0 9.1  MG 2.4  --  1.3* 1.5* 2.1  --  1.6*  PHOS  --  2.7  --   --   --   --   --    Liver Function Tests: Recent Labs  Lab 03/20/22 1841 03/21/22 0516 03/22/22 0503 03/23/22 0916 03/25/22 0330  AST 26 19  --  17 23  ALT 14 12  --  13 16  ALKPHOS 101 98  --  92 94  BILITOT 0.8 0.9  --  1.0 1.0  PROT 6.7 6.0*  --  6.7 7.0  ALBUMIN 3.4* 3.0* 3.0* 3.1* 2.9*   No results for input(s): "LIPASE", "AMYLASE" in the last 168 hours. No results for input(s): "AMMONIA" in the last 168 hours. Coagulation Profile: Recent Labs  Lab 03/20/22 1841  INR 1.1   CBC: Recent Labs  Lab 03/20/22 1841 03/21/22 0516 03/24/22 0310 03/25/22 0330  WBC 5.2 3.7* 4.9 6.4   NEUTROABS 3.7 2.4  --   --   HGB 13.7 13.1 11.7* 12.2*  HCT 39.7 38.1* 34.7* 36.0*  MCV 97.3 99.2 101.5* 100.6*  PLT 208 202 245 295   Cardiac Enzymes: Recent Labs  Lab 03/25/22 0330  CKTOTAL 118   BNP: Invalid input(s): "POCBNP" CBG: Recent Labs  Lab 03/20/22 1749 03/20/22 1852  GLUCAP 133* 115*   HbA1C: No results for input(s): "HGBA1C" in the last 72 hours. Urine analysis:    Component Value Date/Time   COLORURINE YELLOW 03/24/2022 1718   APPEARANCEUR CLEAR 03/24/2022 1718   LABSPEC 1.013 03/24/2022 1718   PHURINE 9.0 (H) 03/24/2022 1718   GLUCOSEU 50 (A) 03/24/2022 1718   HGBUR NEGATIVE 03/24/2022 1718   BILIRUBINUR NEGATIVE 03/24/2022 1718   KETONESUR NEGATIVE 03/24/2022 1718   PROTEINUR NEGATIVE 03/24/2022 1718   NITRITE NEGATIVE 03/24/2022 1718   LEUKOCYTESUR NEGATIVE 03/24/2022 1718   Sepsis Labs: @LABRCNTIP (procalcitonin:4,lacticidven:4) ) Recent Results (from the past 240 hour(s))  MRSA Next Gen by PCR, Nasal     Status: None   Collection Time: 03/22/22 11:40 PM   Specimen: Nasal Mucosa; Nasal Swab  Result Value Ref Range Status   MRSA by PCR Next Gen NOT DETECTED NOT DETECTED Final    Comment: (NOTE) The GeneXpert MRSA Assay (FDA approved for NASAL specimens only), is one component of a comprehensive MRSA colonization surveillance program. It is not intended to diagnose MRSA infection nor to guide or monitor treatment for MRSA infections. Test performance is not FDA approved in patients less than 72 years old. Performed at [facility], [address] [place], [place] [address]   Urine Culture     Status: Abnormal   Collection Time: 03/24/22  5:18 PM   Specimen: Urine, Clean Catch  Result Value Ref Range Status   Specimen Description   Final    URINE, CLEAN CATCH Performed at [facility], [address] [place], [place] [address]    Special Requests   Final    NONE Performed at [facility], [address] [place], [place] [address]     Culture (A)  Final    <10,000 COLONIES/mL INSIGNIFICANT GROWTH Performed at [facility], [place]  [address], [place], [place] [address]    Report Status 03/25/2022 FINAL  Final  Culture, blood (Routine X 2) w Reflex to ID Panel     Status: None (Preliminary result)   Collection Time: 03/24/22  5:52 PM   Specimen: BLOOD  Result Value Ref Range Status   Specimen Description BLOOD LEFT ANTECUBITAL  Final   Special Requests   Final    BOTTLES DRAWN AEROBIC AND ANAEROBIC Blood Culture adequate volume   Culture   Final    NO GROWTH 2 DAYS Performed at [facility], [address] [place], [place] [address]    Report Status PENDING  Incomplete  Culture, blood (Routine X 2) w Reflex to ID Panel     Status: None (Preliminary result)   Collection Time: 03/24/22  5:52 PM   Specimen: BLOOD  Result Value Ref Range Status   Specimen Description BLOOD LEFT WRIST  Final   Special Requests   Final    BOTTLES DRAWN AEROBIC AND ANAEROBIC Blood Culture results may not be optimal due to an excessive volume of blood received in culture bottles   Culture   Final    NO GROWTH 2 DAYS Performed at [facility], [address] [place], [place] [address]    Report Status PENDING  Incomplete     Scheduled Meds:  calcium carbonate  800 mg of elemental calcium Oral TID WC   chlordiazePOXIDE  25 mg Oral TID   Chlorhexidine Gluconate Cloth  6 each Topical Daily   folic acid  1 mg Oral Daily   levETIRAcetam  500 mg Oral BID   magnesium oxide  400 mg Oral BID   metoprolol tartrate  50 mg Oral BID   metoprolol tartrate  50 mg Oral Once   multivitamin with minerals  1 tablet Oral Once   nicotine  21 mg Transdermal Daily   pantoprazole  40 mg Oral Daily   thiamine  100 mg Oral Daily   Or   thiamine  100 mg Intravenous Daily   Continuous Infusions:  sodium chloride 75 mL/hr at 03/26/22 1300   dexmedetomidine (PRECEDEX) IV infusion Stopped (03/24/22 0930)    Procedures/Studies: ECHOCARDIOGRAM  COMPLETE  Result Date: 03/25/2022    ECHOCARDIOGRAM REPORT   Patient Name:   MUDASSIR SUNGA [facility] Date of Exam: 03/25/2022 Medical Rec #:  [id number]    Height:       69.0 in Accession #:    [id number]   Weight:       144.2 lb Date of Birth:  1960/06/08   BSA:          1.798 m Patient Age:    4 years     BP:           154/96 mmHg Patient Gender: M            HR:           114 bpm. Exam Location:  [facility] Procedure: 2D Echo, Cardiac Doppler and Color Doppler Indications:    SVT  History:        Patient has no prior history of Echocardiogram examinations.                 Arrythmias:Tachycardia; Risk Factors:Hypertension and Current                 Smoker.  Sonographer:    Wenda Low Referring Phys: [phone] Rynlee Lisbon IMPRESSIONS  1. Left ventricular ejection fraction, by estimation, is 55 to 60%. The  left ventricle has normal function. The left ventricle has no regional wall motion abnormalities. Left ventricular diastolic parameters were normal.  2. Right ventricular systolic function is normal. The right ventricular size is normal. There is normal pulmonary artery systolic pressure.  3. The mitral valve is grossly normal. No evidence of mitral valve regurgitation. No evidence of mitral stenosis.  4. The aortic valve is normal in structure. Aortic valve regurgitation is not visualized. No aortic stenosis is present.  5. The inferior vena cava is normal in size with greater than 50% respiratory variability, suggesting right atrial pressure of 3 mmHg. FINDINGS  Left Ventricle: Left ventricular ejection fraction, by estimation, is 55 to 60%. The left ventricle has normal function. The left ventricle has no regional wall motion abnormalities. The left ventricular internal cavity size was normal in size. There is  no left ventricular hypertrophy. Left ventricular diastolic parameters were normal. Right Ventricle: The right ventricular size is normal. No increase in right ventricular wall thickness. Right ventricular  systolic function is normal. There is normal pulmonary artery systolic pressure. The tricuspid regurgitant velocity is 2.62 m/s, and  with an assumed right atrial pressure of 3 mmHg, the estimated right ventricular systolic pressure is [id number] mmHg. Left Atrium: Left atrial size was normal in size. Right Atrium: Right atrial size was normal in size. Pericardium: Trivial pericardial effusion is present. Mitral Valve: The mitral valve is grossly normal. No evidence of mitral valve regurgitation. No evidence of mitral valve stenosis. MV peak gradient, 2.0 mmHg. The mean mitral valve gradient is 1.0 mmHg. Tricuspid Valve: The tricuspid valve is grossly normal. Tricuspid valve regurgitation is mild . No evidence of tricuspid stenosis. Aortic Valve: The aortic valve is normal in structure. Aortic valve regurgitation is not visualized. No aortic stenosis is present. Aortic valve mean gradient measures 2.0 mmHg. Aortic valve peak gradient measures 4.4 mmHg. Aortic valve area, by VTI measures 2.70 cm. Pulmonic Valve: The pulmonic valve was grossly normal. Pulmonic valve regurgitation is trivial. No evidence of pulmonic stenosis. Aorta: The aortic root is normal in size and structure. Venous: The inferior vena cava is normal in size with greater than 50% respiratory variability, suggesting right atrial pressure of 3 mmHg. IAS/Shunts: No atrial level shunt detected by color flow Doppler.  LEFT VENTRICLE PLAX 2D LVIDd:         4.00 cm   Diastology LVIDs:         2.90 cm   LV e' medial:    9.03 cm/s LV PW:         1.20 cm   LV E/e' medial:  5.7 LV IVS:        1.20 cm   LV e' lateral:   8.05 cm/s LVOT diam:     2.10 cm   LV E/e' lateral: 6.4 LV SV:         43 LV SV Index:   24 LVOT Area:     3.46 cm  RIGHT VENTRICLE RV Basal diam:  3.20 cm RV Mid diam:    2.20 cm RV S prime:     16.90 cm/s TAPSE (M-mode): 2.4 cm LEFT ATRIUM             Index        RIGHT ATRIUM           Index LA diam:        2.80 cm 1.56 cm/m   RA Area:      14.90 cm LA Vol (A2C):  34.0 ml 18.91 ml/m  RA Volume:   40.00 ml  22.25 ml/m LA Vol (A4C):   20.6 ml 11.46 ml/m LA Biplane Vol: 27.4 ml 15.24 ml/m  AORTIC VALVE                    PULMONIC VALVE AV Area (Vmax):    2.89 cm     PV Vmax:       1.29 m/s AV Area (Vmean):   2.82 cm     PV Peak grad:  6.7 mmHg AV Area (VTI):     2.70 cm AV Vmax:           105.00 cm/s AV Vmean:          70.900 cm/s AV VTI:            0.158 m AV Peak Grad:      4.4 mmHg AV Mean Grad:      2.0 mmHg LVOT Vmax:         87.50 cm/s LVOT Vmean:        57.700 cm/s LVOT VTI:          0.123 m LVOT/AV VTI ratio: 0.78  AORTA Ao Root diam: 3.30 cm MITRAL VALVE               TRICUSPID VALVE MV Area (PHT): 5.58 cm    TR Peak grad:   27.5 mmHg MV Area VTI:   3.46 cm    TR Vmax:        262.00 cm/s MV Peak grad:  2.0 mmHg MV Mean grad:  1.0 mmHg    SHUNTS MV Vmax:       0.71 m/s    Systemic VTI:  0.12 m MV Vmean:      43.3 cm/s   Systemic Diam: 2.10 cm MV Decel Time: 136 msec MV E velocity: 51.80 cm/s MV A velocity: 65.50 cm/s MV E/A ratio:  0.79 Vishnu Priya Mallipeddi Electronically signed by Lorelee Cover Mallipeddi Signature Date/Time: 03/25/2022/1:55:03 PM    Final    DG CHEST PORT 1 VIEW  Result Date: 03/24/2022 CLINICAL DATA:  Fever and cough. EXAM: PORTABLE CHEST 1 VIEW COMPARISON:  Chest radiograph dated 01/24/2022 and CT dated 01/25/2019. FINDINGS: Mild centrilobular emphysema. No focal consolidation, pleural effusion, or pneumothorax. The cardiac silhouette is within normal limits. No acute osseous pathology. IMPRESSION: No active disease. Electronically Signed   By: Anner Crete M.D.   On: 03/24/2022 18:52   CT HEAD WO CONTRAST (5MM)  Result Date: 03/24/2022 CLINICAL DATA:  Recent subdural hematoma.  Double vision. EXAM: CT HEAD WITHOUT CONTRAST TECHNIQUE: Contiguous axial images were obtained from the base of the skull through the vertex without intravenous contrast. RADIATION DOSE REDUCTION: This exam was performed  according to the departmental dose-optimization program which includes automated exposure control, adjustment of the mA and/or kV according to patient size and/or use of iterative reconstruction technique. COMPARISON:  Brain MRI from 3 days ago FINDINGS: Brain: Left inferior and anterior temporal encephalomalacia, there is history of cerebral contusions. Motion artifact especially affecting the surface of the brain but still visible and stable subdural hematoma along the left parietal convexity measuring 3 mm in thickness, see 4: 54. No hydrocephalus or masslike finding Vascular: No hyperdense vessel or unexpected calcification. Skull: Normal. Negative for fracture or focal lesion. Sinuses/Orbits: No acute finding. IMPRESSION: 1. Unchanged trace residual subdural hematoma along the left parietal convexity, up to 3 mm in thickness. 2. Sequela of anterior left  frontal and temporal contusions. Electronically Signed   By: Jorje Guild M.D.   On: 03/24/2022 13:20   MR CERVICAL SPINE WO CONTRAST  Result Date: 03/21/2022 CLINICAL DATA:  Paresthesias, dizziness, cervical radiculopathy EXAM: MRI CERVICAL AND THORACIC SPINE WITHOUT CONTRAST TECHNIQUE: Multiplanar and multiecho pulse sequences of the cervical spine, to include the craniocervical junction and cervicothoracic junction, and the thoracic spine, were obtained without intravenous contrast. COMPARISON:  No prior MRI of the cervical or thoracic spine, correlation is made with 01/24/2022 CT cervical spine FINDINGS: MRI CERVICAL SPINE FINDINGS Evaluation is somewhat limited by motion artifact. Alignment: No listhesis. Vertebrae: No fracture, evidence of discitis, or bone lesion. Cord: Normal signal and morphology. Posterior Fossa, vertebral arteries, paraspinal tissues: Negative. Disc levels: C2-C3: No significant disc bulge. No spinal canal stenosis or neuroforaminal narrowing. C3-C4: No significant disc bulge. No spinal canal stenosis or neuroforaminal  narrowing. C4-C5: Disc height loss and mild disc bulge. Facet and uncovertebral hypertrophy, right-greater-than-left. No spinal canal stenosis. Mild-to-moderate right neural foraminal narrowing. C5-C6: Disc height loss and mild disc bulge. Facet and uncovertebral hypertrophy. No spinal canal stenosis. Mild bilateral neural foraminal narrowing. C6-C7: Disc height loss and mild disc bulge with disc osteophyte complex. Facet and uncovertebral hypertrophy. No spinal canal stenosis. Mild bilateral neural foraminal narrowing. C7-T1: No significant disc bulge. No spinal canal stenosis or neuroforaminal narrowing. MRI THORACIC SPINE FINDINGS Alignment: No listhesis. Preservation of the normal thoracic kyphosis. Vertebrae: No fracture, evidence of discitis, or bone lesion. Cord:  Normal signal and morphology. Paraspinal and other soft tissues: Trace pleural effusions. Disc levels: No significant spinal canal stenosis or neural foraminal narrowing. IMPRESSION: 1. C4-C5 mild-to-moderate right neural foraminal narrowing. 2. C5-C6 and C6-C7 mild bilateral neural foraminal narrowing. 3. No spinal canal stenosis in the cervical spine. 4. No significant spinal canal stenosis or neural foraminal narrowing in the thoracic spine. 5. Trace pleural effusions. Electronically Signed   By: Merilyn Baba M.D.   On: 03/21/2022 18:44   MR THORACIC SPINE WO CONTRAST  Result Date: 03/21/2022 CLINICAL DATA:  Paresthesias, dizziness, cervical radiculopathy EXAM: MRI CERVICAL AND THORACIC SPINE WITHOUT CONTRAST TECHNIQUE: Multiplanar and multiecho pulse sequences of the cervical spine, to include the craniocervical junction and cervicothoracic junction, and the thoracic spine, were obtained without intravenous contrast. COMPARISON:  No prior MRI of the cervical or thoracic spine, correlation is made with 01/24/2022 CT cervical spine FINDINGS: MRI CERVICAL SPINE FINDINGS Evaluation is somewhat limited by motion artifact. Alignment: No  listhesis. Vertebrae: No fracture, evidence of discitis, or bone lesion. Cord: Normal signal and morphology. Posterior Fossa, vertebral arteries, paraspinal tissues: Negative. Disc levels: C2-C3: No significant disc bulge. No spinal canal stenosis or neuroforaminal narrowing. C3-C4: No significant disc bulge. No spinal canal stenosis or neuroforaminal narrowing. C4-C5: Disc height loss and mild disc bulge. Facet and uncovertebral hypertrophy, right-greater-than-left. No spinal canal stenosis. Mild-to-moderate right neural foraminal narrowing. C5-C6: Disc height loss and mild disc bulge. Facet and uncovertebral hypertrophy. No spinal canal stenosis. Mild bilateral neural foraminal narrowing. C6-C7: Disc height loss and mild disc bulge with disc osteophyte complex. Facet and uncovertebral hypertrophy. No spinal canal stenosis. Mild bilateral neural foraminal narrowing. C7-T1: No significant disc bulge. No spinal canal stenosis or neuroforaminal narrowing. MRI THORACIC SPINE FINDINGS Alignment: No listhesis. Preservation of the normal thoracic kyphosis. Vertebrae: No fracture, evidence of discitis, or bone lesion. Cord:  Normal signal and morphology. Paraspinal and other soft tissues: Trace pleural effusions. Disc levels: No significant spinal canal stenosis or neural foraminal narrowing.  IMPRESSION: 1. C4-C5 mild-to-moderate right neural foraminal narrowing. 2. C5-C6 and C6-C7 mild bilateral neural foraminal narrowing. 3. No spinal canal stenosis in the cervical spine. 4. No significant spinal canal stenosis or neural foraminal narrowing in the thoracic spine. 5. Trace pleural effusions. Electronically Signed   By: Merilyn Baba M.D.   On: 03/21/2022 18:44   MR BRAIN WO CONTRAST  Result Date: 03/21/2022 CLINICAL DATA:  Paresthesias, dizziness, could be secondary to fall induced neuropathy or spinal stenosis. Concern for seizure. EXAM: MRI HEAD WITHOUT CONTRAST TECHNIQUE: Multiplanar, multiecho pulse sequences of  the brain and surrounding structures were obtained without intravenous contrast. COMPARISON:  No prior MRI, correlation is made with head CT 03/20/2022 FINDINGS: Evaluation is somewhat limited by motion artifact. Brain: No restricted diffusion to suggest acute or subacute infarct. No acute hemorrhage, mass, mass effect, or midline shift. No hydrocephalus. 5 mm residual left parietal subdural hematoma (series 18, image 7), as seen on the recent CT. No new extra-axial collection. Remote right posterior lentiform nucleus lacunar infarct. Encephalomalacia in the left anteromedial temporal lobe (series 15, image 12) and left inferior frontal lobe (series 15, images 19 and 22), likely the sequela of prior trauma. These areas are associated with hemosiderin deposition. The hippocampi are symmetric in size and signal. No heterotopia or evidence of cortical dysgenesis. Vascular: Normal arterial flow voids. Skull and upper cervical spine: Normal marrow signal. Sinuses/Orbits: Mucosal thickening in the maxillary sinuses and ethmoid air cells. The orbits are unremarkable. Other: Trace fluid in left mastoid air cells. IMPRESSION: 1. No acute intracranial process. No acute or subacute infarct. No seizure etiology identified. 2. 5 mm residual left parietal subdural hematoma, as seen on the recent CT. 3. Encephalomalacia in the left anteromedial temporal lobe and left inferior frontal lobe, likely the sequela of prior trauma. Electronically Signed   By: Merilyn Baba M.D.   On: 03/21/2022 18:20   DG Wrist 2 Views Right  Result Date: 03/20/2022 CLINICAL DATA:  Wrist pain. EXAM: RIGHT WRIST - 2 VIEW COMPARISON:  None Available. FINDINGS: There is soft tissue swelling surrounding the wrist. There is no acute fracture or dislocation. There is mild radiocarpal joint space narrowing. IMPRESSION: 1. No acute fracture or dislocation. 2. Soft tissue swelling surrounding the wrist. Electronically Signed   By: Ronney Asters M.D.   On:  03/20/2022 23:04   CT HEAD WO CONTRAST  Result Date: 03/20/2022 CLINICAL DATA:  New onset seizure, recent trauma with subdural hematoma. EXAM: CT HEAD WITHOUT CONTRAST TECHNIQUE: Contiguous axial images were obtained from the base of the skull through the vertex without intravenous contrast. RADIATION DOSE REDUCTION: This exam was performed according to the departmental dose-optimization program which includes automated exposure control, adjustment of the mA and/or kV according to patient size and/or use of iterative reconstruction technique. COMPARISON:  01/25/2022 FINDINGS: Brain: No evidence of acute infarction, hydrocephalus, extra-axial collection or mass lesion/mass effect. Trace residual left parietal subdural hematoma (coronal image 52), measuring 5 mm in thickness, previously 19 mm. Encephalomalacic changes in the left frontal lobe (series 2/image 11). Prior left frontal contusion with subarachnoid hemorrhage has resolved. Subcortical white matter and periventricular small vessel ischemic changes. Vascular: Intracranial atherosclerosis. Skull: Normal. Negative for fracture or focal lesion. Sinuses/Orbits: Minimal partial opacification of the bilateral maxillary sinuses. Mastoid air cells are clear. Other: None. IMPRESSION: Trace residual left parietal subdural hematoma, as above. Encephalomalacic changes in the left frontal lobe at the site of prior left frontal contusion. Small vessel ischemic changes. Electronically Signed  By: Julian Hy M.D.   On: 03/20/2022 18:13    Orson Eva, DO  [facility]  If 7PM-7AM, please contact night-coverage [email or web address] Password [email or web address] 03/26/2022, 2:38 PM   LOS: 6 days

## 2022-03-26 NOTE — Progress Notes (Addendum)
Pt. was found sitting on the floor, confused, no visible injury, denies any pain.  03/26/22 0500  What Happened  Was fall witnessed? No  Was patient injured? No  Patient found on floor  Found by Staff-comment Evorn Gong, RN)  Stated prior activity to/from bed, chair, or stretcher (with assist)  Follow Up  MD notified Dr. Bernadette Hoit, MD  Time MD notified [phone]  Family notified Yes - comment  Time family notified 0500  Additional tests No  Progress note created (see row info) Yes  Adult Fall Risk Assessment  Risk Factor Category (scoring not indicated) High fall risk per protocol (document High fall risk)  Age 61  Fall History: Fall within 6 months prior to admission 5  Elimination; Bowel and/or Urine Incontinence 0  Elimination; Bowel and/or Urine Urgency/Frequency 0  Medications: includes PCA/Opiates, Anti-convulsants, Anti-hypertensives, Diuretics, Hypnotics, Laxatives, Sedatives, and Psychotropics 5  Patient Care Equipment 2  Mobility-Assistance 2  Mobility-Gait 2  Mobility-Sensory Deficit 0  Altered awareness of immediate physical environment 0  Impulsiveness 0  Lack of understanding of one's physical/cognitive limitations 0  Total Score 16  Patient Fall Risk Level High fall risk  Adult Fall Risk Interventions  Required Bundle Interventions *See Row Information* High fall risk - low, moderate, and high requirements implemented  Additional Interventions Use of appropriate toileting equipment (bedpan, BSC, etc.);Reorient/diversional activities with confused patients;Room near nurses station  Screening for Fall Injury Risk (To be completed on HIGH fall risk patients) - Assessing Need for Floor Mats  Risk For Fall Injury- Criteria for Floor Mats Previous fall this admission  Will Implement Floor Mats Yes  Vitals  Temp 98 F (36.7 C)  Temp Source Oral  BP (!) 161/104  MAP (mmHg) 123  BP Location Right Arm  BP Method Automatic  Patient Position (if appropriate) Lying   Pulse Rate (!) 118  Pulse Rate Source Monitor  ECG Heart Rate (!) 118  Cardiac Rhythm ST  Resp (!) 22  Oxygen Therapy  SpO2 97 %  O2 Device Room Air  Pain Assessment  Pain Scale 0-10  Pain Score 0  PCA/Epidural/Spinal Assessment  Respiratory Pattern Regular;Unlabored  Neurological  Level of Consciousness Alert  Orientation Level Oriented to person;Disoriented to place;Disoriented to time;Disoriented to situation  Armed forces training and education officer Clear  R Pupil Size (mm) 3  R Pupil Shape Round  R Pupil Reaction Brisk  L Pupil Size (mm) 3  L Pupil Shape Round  L Pupil Reaction Brisk  Motor Function/Sensation Assessment Grip  R Hand Grip Strong  L Hand Grip Strong  Glasgow Coma Scale  Eye Opening 4  Best Verbal Response (NON-intubated) 4  Best Motor Response 6  Glasgow Coma Scale Score 14  Musculoskeletal  Musculoskeletal (WDL) X  Assistive Device Front wheel walker  Generalized Weakness Yes  Weight Bearing Restrictions No  Musculoskeletal Details  RLE Weakness  LLE Weakness  Integumentary  Integumentary (WDL) X  RN Assisting with Skin Assessment on Admission Eric Form, RN  Skin Color Appropriate for ethnicity  Skin Condition Dry  Skin Integrity Erythema/redness  Ecchymosis Location Arm  Ecchymosis Location Orientation Bilateral  Erythema/Redness Location Arm;Neck  Erythema/Redness Location Orientation Bilateral  Scratch Marks Location Abdomen  Scratch Marks Location Orientation Bilateral  Scratch Marks Intervention Cleansed  Skin Turgor Non-tenting

## 2022-03-27 DIAGNOSIS — F17200 Nicotine dependence, unspecified, uncomplicated: Secondary | ICD-10-CM | POA: Diagnosis not present

## 2022-03-27 DIAGNOSIS — I471 Supraventricular tachycardia, unspecified: Secondary | ICD-10-CM | POA: Diagnosis not present

## 2022-03-27 DIAGNOSIS — L03113 Cellulitis of right upper limb: Secondary | ICD-10-CM | POA: Diagnosis not present

## 2022-03-27 DIAGNOSIS — F10931 Alcohol use, unspecified with withdrawal delirium: Secondary | ICD-10-CM | POA: Diagnosis not present

## 2022-03-27 DIAGNOSIS — L03119 Cellulitis of unspecified part of limb: Secondary | ICD-10-CM | POA: Diagnosis not present

## 2022-03-27 LAB — BASIC METABOLIC PANEL
Anion gap: 10 (ref 5–15)
BUN: 6 mg/dL (ref 6–20)
CO2: 21 mmol/L — ABNORMAL LOW (ref 22–32)
Calcium: 8.5 mg/dL — ABNORMAL LOW (ref 8.9–10.3)
Chloride: 106 mmol/L (ref 98–111)
Creatinine, Ser: 0.64 mg/dL (ref 0.61–1.24)
GFR, Estimated: >60 mL/min (ref >60)
Glucose, Bld: 104 mg/dL — ABNORMAL HIGH (ref 70–99)
Potassium: 3.6 mmol/L (ref 3.5–5.1)
Sodium: 137 mmol/L (ref 135–145)

## 2022-03-27 LAB — MAGNESIUM: Magnesium: 1.8 mg/dL (ref 1.7–2.4)

## 2022-03-27 MED ORDER — METOPROLOL TARTRATE 50 MG PO TABS
75.0000 mg | ORAL_TABLET | Freq: Two times a day (BID) | ORAL | Status: DC
  Administered 2022-03-27 – 2022-03-31 (×9): 75 mg via ORAL
  Filled 2022-03-27 (×9): qty 1

## 2022-03-27 MED ORDER — CHLORDIAZEPOXIDE HCL 25 MG PO CAPS
25.0000 mg | ORAL_CAPSULE | Freq: Two times a day (BID) | ORAL | Status: DC
  Administered 2022-03-27 – 2022-03-29 (×4): 25 mg via ORAL
  Filled 2022-03-27 (×4): qty 1

## 2022-03-27 MED ORDER — METOPROLOL TARTRATE 50 MG PO TABS: 100.0000 mg | ORAL_TABLET | Freq: Two times a day (BID) | ORAL | Status: DC

## 2022-03-27 MED ORDER — METOPROLOL TARTRATE 75 MG PO TABS: 75.0000 mg | ORAL_TABLET | Freq: Two times a day (BID) | ORAL | 1 refills | Status: DC

## 2022-03-27 MED ORDER — DILTIAZEM HCL ER COATED BEADS 120 MG PO CP24
120.0000 mg | ORAL_CAPSULE | Freq: Every day | ORAL | Status: DC
  Administered 2022-03-27 – 2022-03-31 (×5): 120 mg via ORAL
  Filled 2022-03-27 (×5): qty 1

## 2022-03-27 MED ORDER — METOPROLOL TARTRATE 50 MG PO TABS: 75.0000 mg | ORAL_TABLET | Freq: Two times a day (BID) | ORAL | Status: DC

## 2022-03-27 MED ORDER — CEFAZOLIN SODIUM-DEXTROSE 2-4 GM/100ML-% IV SOLN
2.0000 g | Freq: Three times a day (TID) | INTRAVENOUS | Status: DC
  Administered 2022-03-27 – 2022-03-28 (×3): 2 g via INTRAVENOUS
  Filled 2022-03-27 (×3): qty 100

## 2022-03-27 MED ORDER — LEVETIRACETAM 500 MG PO TABS: 500.0000 mg | ORAL_TABLET | Freq: Two times a day (BID) | ORAL | 1 refills | Status: DC

## 2022-03-27 MED ORDER — DILTIAZEM HCL 30 MG PO TABS
30.0000 mg | ORAL_TABLET | Freq: Four times a day (QID) | ORAL | Status: DC
  Administered 2022-03-27: 30 mg via ORAL
  Filled 2022-03-27: qty 1

## 2022-03-27 MED ORDER — DILTIAZEM HCL ER COATED BEADS 120 MG PO CP24: 120.0000 mg | ORAL_CAPSULE | Freq: Every day | ORAL | 1 refills | Status: DC

## 2022-03-27 MED ORDER — MAGNESIUM OXIDE -MG SUPPLEMENT 400 (240 MG) MG PO TABS: 400.0000 mg | ORAL_TABLET | Freq: Two times a day (BID) | ORAL | 1 refills | Status: DC

## 2022-03-27 MED ORDER — CHLORDIAZEPOXIDE HCL 25 MG PO CAPS: 25.0000 mg | ORAL_CAPSULE | Freq: Two times a day (BID) | ORAL | 0 refills | Status: DC

## 2022-03-27 NOTE — Progress Notes (Signed)
Discharge cancelled Pt complained of right forearm pain and redness Pt states it was site of prior IV/venipuncture  See pic>>  Start IV cefazolin Image arm if no improvement Cancel discharge Family notified Am [facility]  DTat

## 2022-03-27 NOTE — Progress Notes (Addendum)
Patient noted to have increased swelling, redness and tenderness around the right AC area. Patient complained that it did hurt to touch. IV that was not being used during Probation officer shift (since 7am) was removed. IV fluids were only infusing in left arm today. Notified Dr Tat who came to assess right arm. Will apply ice pack per Dr Tat and have elevated arm on pillow.

## 2022-03-27 NOTE — Discharge Summary (Signed)
Physician Discharge Summary   Patient: Eric Conley MRN: [id number] DOB: Apr 29, 1961  Admit date:     03/20/2022  Discharge date: 03/27/22  Discharge Physician: Onalee Hua Shameika Speelman   PCP: Assunta Found, MD   Recommendations at discharge:   Please follow up with primary care provider within 1-2 weeks  Please repeat BMP and CBC in one week    Hospital Course: 61 y.o. male with medical history significant of acid reflux, depression, hypertension, alcohol dependence, subdural hematoma and ,left parietal extra-axial hemorrhage and hemorrhagic contusions in left frontal and temporal lobe in Aug 2023 presents the ED with a chief complaint of seizure.  Patient gives history that he has given to ED staff.  To me, patient reports he came into the ED because he was lightheaded and hungry.  He reports that he is lightheaded probably because he was hungry.  He has had diarrhea in the interval.  His diarrhea is nonbloody and happens 3 times a day.  He has no associated abdominal pain.  He blames some [place] takeout food for his diarrhea.  He reports that the lightheadedness happen when he woke up.  He reports that it was present before he had any postural changes.  He reports that his hands were shaky because he had not eaten.  He reports no loss of consciousness, no loss of time, no falls.  Patient reports that he used to have a history of alcohol dependence but does not anymore.  He reports that his last drink was yesterday.  It was 1 beer.  When asked him that she normally drinks he will not give an answer.  He does report that he only drinks on days when he does not have to work.  When it is brought to his attention that he is has not had to work in several weeks because of his recent trauma, he reports that he has not been drinking has been in the hospital.   In the ED he reported that he had woken up this morning feeling tingly and having intermittent numbness to his left arm.  He reports he woke up with the  symptoms today.  Did not get any better throughout the day.  EMS was called and in route to the hospital patient had witnessed tonic-clonic activity which lasted for about a minute.  2-1/2 mg of Versed was given which aborted the tonic-clonic activity.  Patient was postictal on arrival to the hospital.  Patient does not remember any of this.   Patient does drink and does smoke as discussed above.  He does not use illicit drugs.  He is vaccinated for COVID.  Patient is full code.  Neurology was consulted and patient was started on Keppra.  He did not have any further seizures.  Unfortunately, the patient began developing alcohol withdrawal manifested by increasing agitation.  He was transferred to the ICU and placed on Precedex in the early morning 03/23/2022.  On the evening 10/19-10/20, patient developed fever and SVT.  This prolonged his hospitalization.  He was started on metoprolol and electrolytes were optimized. Metoprolol was gradually titrated up to optimize HR.  He remained hemodynamically stable.  Assessment and Plan: * Seizure (HCC) - secondary to intracranial bleed, SDH, hemorrhagic brain contusions -severe hypomagnesemia may have contributed - Neuro consulted and recommending keppra 500 mg BID  - Continue CIWA protocol for alcohol withdrawal  -Continue to monitor and replete magnesium - Seizure precautions - Continue to monitor -no further seizures during hospitalization  Diarrhea -  2 days of nonbloody diarrhea reported PTA - contributed to to electrolyte abnormalities - The patient continues to have diarrhea here we will get stool studies - No abdominal pain presently - resolved.  Tolerating diet  Delirium tremens (HCC) - Family reports that patient has been a heavy drinker - Patient will not quantify how much he drinks -  his last drink was 10/14 and he had 1 beer per his report - He reports he only drinks on days that he is not working, but he has been out of work due  to his recent trauma with subdural hematoma -remained agitated on librium and CIWA -03/23/22 AM--transferred to ICU and started on Precedex -03/24/22 AM--weaned off Precedex -10/19-repeat CT brain--pt complains of ?diplopia>>>Unchanged trace residual subdural hematoma along the left parietal convexity Overall improved;  A&O x 3 on day of dc  SVT (supraventricular tachycardia) 10/19-10/20--pt developed paroxysms of SVT -personally reviewed tele>>possible atrial tach/MAT -started metoprolol>>increase to 75 mg bid -echo EF 55-60%, no WMA, normal RVF -10/16 TSH 0.818  Fever Developed fever 10/19 evening -UA--no pyuria -personally reviewed CXR--no infiltrates -blood culture neg to date -lactic 0.8 -PCT <0.10 -no further fevers x 48 hours  Hyperkalemia Given lokelma Remove KCl from IVF -repeat BMP>>improved  Wrist pain - Patient reports right wrist is painful - Slightly swollen compared to left wrist - Patient does not remember injuring it, so the possibility that it was her during seizure activity - X-ray with no acute fractures found, wrist splint ordered for comfort.  - judicious opioids  Tobacco use disorder - Patient reports he smokes 2 or 3 cigarettes a day - nicotine patch ordered  - Counseled on importance of cessation  GERD (gastroesophageal reflux disease) - Continue PPI  Hypocalcemia - Calcium 6.1 upon presentation -Corrects to 6.6 for albumin - Replaced with 1 g of calcium gluconate and tums oral TID - am CMP  Hypomagnesemia - admission Magnesium is undetectable at less than 0.5 -Secondary to poor p.o. intake and GI losses - repleting IV again - start daily po mag ox  Hypokalemia - Potassium is 2.6 upon presentation - Secondary to poor p.o. intake and GI losses - continue to replace, recheck in AM         Consultants: none Procedures performed: none  Disposition: Home Diet recommendation:  Cardiac diet DISCHARGE MEDICATION: Allergies as  of 03/27/2022       Reactions   Codeine    Poison Sumac Extract         Medication List     STOP taking these medications    diazepam 10 MG tablet Commonly known as: VALIUM   HYDROcodone-acetaminophen 5-325 MG tablet Commonly known as: NORCO/VICODIN   NEXIUM PO       TAKE these medications    chlordiazePOXIDE 25 MG capsule Commonly known as: LIBRIUM Take 1 capsule (25 mg total) by mouth 2 (two) times daily.   diltiazem 120 MG 24 hr capsule Commonly known as: CARDIZEM CD Take 1 capsule (120 mg total) by mouth daily.   levETIRAcetam 500 MG tablet Commonly known as: KEPPRA Take 1 tablet (500 mg total) by mouth 2 (two) times daily.   magnesium oxide 400 (240 Mg) MG tablet Commonly known as: MAG-OX Take 1 tablet (400 mg total) by mouth 2 (two) times daily.   Metoprolol Tartrate 75 MG Tabs Take 75 mg by mouth 2 (two) times daily.   omeprazole 20 MG capsule Commonly known as: PRILOSEC Take 20 mg by mouth daily as needed (indigestion).  Alternates with nexium   POTASSIMIN PO Take 1 tablet by mouth in the morning and at bedtime.        Discharge Exam: Filed Weights   03/23/22 0500 03/25/22 0500 03/27/22 0500  Weight: 66.7 kg 65.4 kg 66 kg   HEENT:  [place]/AT, No thrush, no icterus CV:  RRR, no rub, no S3, no S4 Lung:  CTA, no wheeze, no rhonchi Abd:  soft/+BS, NT Ext:  No edema, no lymphangitis, no synovitis, no rash   Condition at discharge: stable  The results of significant diagnostics from this hospitalization (including imaging, microbiology, ancillary and laboratory) are listed below for reference.   Imaging Studies: ECHOCARDIOGRAM COMPLETE  Result Date: 03/25/2022    ECHOCARDIOGRAM REPORT   Patient Name:   Eric Conley [facility] Date of Exam: 03/25/2022 Medical Rec #:  [id number]    Height:       69.0 in Accession #:    [id number]   Weight:       144.2 lb Date of Birth:  Nov 24, 1960   BSA:          1.798 m Patient Age:    61 years     BP:           154/96  mmHg Patient Gender: M            HR:           114 bpm. [facility] Procedure: 2D Echo, Cardiac Doppler and Color Doppler Indications:    SVT  History:        Patient has no prior history of Echocardiogram examinations.                 Arrythmias:Tachycardia; Risk Factors:Hypertension and Current                 Smoker.  Sonographer:    Mikki Harbor Referring Phys: [phone] Mickie Badders IMPRESSIONS  1. Left ventricular ejection fraction, by estimation, is 55 to 60%. The left ventricle has normal function. The left ventricle has no regional wall motion abnormalities. Left ventricular diastolic parameters were normal.  2. Right ventricular systolic function is normal. The right ventricular size is normal. There is normal pulmonary artery systolic pressure.  3. The mitral valve is grossly normal. No evidence of mitral valve regurgitation. No evidence of mitral stenosis.  4. The aortic valve is normal in structure. Aortic valve regurgitation is not visualized. No aortic stenosis is present.  5. The inferior vena cava is normal in size with greater than 50% respiratory variability, suggesting right atrial pressure of 3 mmHg. FINDINGS  Left Ventricle: Left ventricular ejection fraction, by estimation, is 55 to 60%. The left ventricle has normal function. The left ventricle has no regional wall motion abnormalities. The left ventricular internal cavity size was normal in size. There is  no left ventricular hypertrophy. Left ventricular diastolic parameters were normal. Right Ventricle: The right ventricular size is normal. No increase in right ventricular wall thickness. Right ventricular systolic function is normal. There is normal pulmonary artery systolic pressure. The tricuspid regurgitant velocity is 2.62 m/s, and  with an assumed right atrial pressure of 3 mmHg, the estimated right ventricular systolic pressure is 30.5 mmHg. Left Atrium: Left atrial size was normal in size. Right Atrium: Right atrial  size was normal in size. Pericardium: Trivial pericardial effusion is present. Mitral Valve: The mitral valve is grossly normal. No evidence of mitral valve regurgitation. No evidence of mitral valve stenosis. MV peak gradient,  2.0 mmHg. The mean mitral valve gradient is 1.0 mmHg. Tricuspid Valve: The tricuspid valve is grossly normal. Tricuspid valve regurgitation is mild . No evidence of tricuspid stenosis. Aortic Valve: The aortic valve is normal in structure. Aortic valve regurgitation is not visualized. No aortic stenosis is present. Aortic valve mean gradient measures 2.0 mmHg. Aortic valve peak gradient measures 4.4 mmHg. Aortic valve area, by VTI measures 2.70 cm. Pulmonic Valve: The pulmonic valve was grossly normal. Pulmonic valve regurgitation is trivial. No evidence of pulmonic stenosis. Aorta: The aortic root is normal in size and structure. Venous: The inferior vena cava is normal in size with greater than 50% respiratory variability, suggesting right atrial pressure of 3 mmHg. IAS/Shunts: No atrial level shunt detected by color flow Doppler.  LEFT VENTRICLE PLAX 2D LVIDd:         4.00 cm   Diastology LVIDs:         2.90 cm   LV e' medial:    9.03 cm/s LV PW:         1.20 cm   LV E/e' medial:  5.7 LV IVS:        1.20 cm   LV e' lateral:   8.05 cm/s LVOT diam:     2.10 cm   LV E/e' lateral: 6.4 LV SV:         43 LV SV Index:   24 LVOT Area:     3.46 cm  RIGHT VENTRICLE RV Basal diam:  3.20 cm RV Mid diam:    2.20 cm RV S prime:     16.90 cm/s TAPSE (M-mode): 2.4 cm LEFT ATRIUM             Index        RIGHT ATRIUM           Index LA diam:        2.80 cm 1.56 cm/m   RA Area:     14.90 cm LA Vol (A2C):   34.0 ml 18.91 ml/m  RA Volume:   40.00 ml  22.25 ml/m LA Vol (A4C):   20.6 ml 11.46 ml/m LA Biplane Vol: 27.4 ml 15.24 ml/m  AORTIC VALVE                    PULMONIC VALVE AV Area (Vmax):    2.89 cm     PV Vmax:       1.29 m/s AV Area (Vmean):   2.82 cm     PV Peak grad:  6.7 mmHg AV Area (VTI):      2.70 cm AV Vmax:           105.00 cm/s AV Vmean:          70.900 cm/s AV VTI:            0.158 m AV Peak Grad:      4.4 mmHg AV Mean Grad:      2.0 mmHg LVOT Vmax:         87.50 cm/s LVOT Vmean:        57.700 cm/s LVOT VTI:          0.123 m LVOT/AV VTI ratio: 0.78  AORTA Ao Root diam: 3.30 cm MITRAL VALVE               TRICUSPID VALVE MV Area (PHT): 5.58 cm    TR Peak grad:   27.5 mmHg MV Area VTI:   3.46 cm    TR Vmax:  262.00 cm/s MV Peak grad:  2.0 mmHg MV Mean grad:  1.0 mmHg    SHUNTS MV Vmax:       0.71 m/s    Systemic VTI:  0.12 m MV Vmean:      43.3 cm/s   Systemic Diam: 2.10 cm MV Decel Time: 136 msec MV E velocity: 51.80 cm/s MV A velocity: 65.50 cm/s MV E/A ratio:  0.79 Vishnu Priya Mallipeddi Electronically signed by Winfield Rast Mallipeddi Signature Date/Time: 03/25/2022/1:55:03 PM    Final    DG CHEST PORT 1 VIEW  Result Date: 03/24/2022 CLINICAL DATA:  Fever and cough. EXAM: PORTABLE CHEST 1 VIEW COMPARISON:  Chest radiograph dated 01/24/2022 and CT dated 01/25/2019. FINDINGS: Mild centrilobular emphysema. No focal consolidation, pleural effusion, or pneumothorax. The cardiac silhouette is within normal limits. No acute osseous pathology. IMPRESSION: No active disease. Electronically Signed   By: Elgie Collard M.D.   On: 03/24/2022 18:52   CT HEAD WO CONTRAST ( )  Result Date: 03/24/2022 CLINICAL DATA:  Recent subdural hematoma.  Double vision. EXAM: CT HEAD WITHOUT CONTRAST TECHNIQUE: Contiguous axial images were obtained from the base of the skull through the vertex without intravenous contrast. RADIATION DOSE REDUCTION: This exam was performed according to the departmental dose-optimization program which includes automated exposure control, adjustment of the mA and/or kV according to patient size and/or use of iterative reconstruction technique. COMPARISON:  Brain MRI from 3 days ago FINDINGS: Brain: Left inferior and anterior temporal encephalomalacia, there is history  of cerebral contusions. Motion artifact especially affecting the surface of the brain but still visible and stable subdural hematoma along the left parietal convexity measuring 3 mm in thickness, see 4: 54. No hydrocephalus or masslike finding Vascular: No hyperdense vessel or unexpected calcification. Skull: Normal. Negative for fracture or focal lesion. Sinuses/Orbits: No acute finding. IMPRESSION: 1. Unchanged trace residual subdural hematoma along the left parietal convexity, up to 3 mm in thickness. 2. Sequela of anterior left frontal and temporal contusions. Electronically Signed   By: Tiburcio Pea M.D.   On: 03/24/2022 13:20   MR CERVICAL SPINE WO CONTRAST  Result Date: 03/21/2022 CLINICAL DATA:  Paresthesias, dizziness, cervical radiculopathy EXAM: MRI CERVICAL AND THORACIC SPINE WITHOUT CONTRAST TECHNIQUE: Multiplanar and multiecho pulse sequences of the cervical spine, to include the craniocervical junction and cervicothoracic junction, and the thoracic spine, were obtained without intravenous contrast. COMPARISON:  No prior MRI of the cervical or thoracic spine, correlation is made with 01/24/2022 CT cervical spine FINDINGS: MRI CERVICAL SPINE FINDINGS Evaluation is somewhat limited by motion artifact. Alignment: No listhesis. Vertebrae: No fracture, evidence of discitis, or bone lesion. Cord: Normal signal and morphology. Posterior Fossa, vertebral arteries, paraspinal tissues: Negative. Disc levels: C2-C3: No significant disc bulge. No spinal canal stenosis or neuroforaminal narrowing. C3-C4: No significant disc bulge. No spinal canal stenosis or neuroforaminal narrowing. C4-C5: Disc height loss and mild disc bulge. Facet and uncovertebral hypertrophy, right-greater-than-left. No spinal canal stenosis. Mild-to-moderate right neural foraminal narrowing. C5-C6: Disc height loss and mild disc bulge. Facet and uncovertebral hypertrophy. No spinal canal stenosis. Mild bilateral neural foraminal  narrowing. C6-C7: Disc height loss and mild disc bulge with disc osteophyte complex. Facet and uncovertebral hypertrophy. No spinal canal stenosis. Mild bilateral neural foraminal narrowing. C7-T1: No significant disc bulge. No spinal canal stenosis or neuroforaminal narrowing. MRI THORACIC SPINE FINDINGS Alignment: No listhesis. Preservation of the normal thoracic kyphosis. Vertebrae: No fracture, evidence of discitis, or bone lesion. Cord:  Normal signal and morphology. Paraspinal and  other soft tissues: Trace pleural effusions. Disc levels: No significant spinal canal stenosis or neural foraminal narrowing. IMPRESSION: 1. C4-C5 mild-to-moderate right neural foraminal narrowing. 2. C5-C6 and C6-C7 mild bilateral neural foraminal narrowing. 3. No spinal canal stenosis in the cervical spine. 4. No significant spinal canal stenosis or neural foraminal narrowing in the thoracic spine. 5. Trace pleural effusions. Electronically Signed   By: Merilyn Baba M.D.   On: 03/21/2022 18:44   MR THORACIC SPINE WO CONTRAST  Result Date: 03/21/2022 CLINICAL DATA:  Paresthesias, dizziness, cervical radiculopathy EXAM: MRI CERVICAL AND THORACIC SPINE WITHOUT CONTRAST TECHNIQUE: Multiplanar and multiecho pulse sequences of the cervical spine, to include the craniocervical junction and cervicothoracic junction, and the thoracic spine, were obtained without intravenous contrast. COMPARISON:  No prior MRI of the cervical or thoracic spine, correlation is made with 01/24/2022 CT cervical spine FINDINGS: MRI CERVICAL SPINE FINDINGS Evaluation is somewhat limited by motion artifact. Alignment: No listhesis. Vertebrae: No fracture, evidence of discitis, or bone lesion. Cord: Normal signal and morphology. Posterior Fossa, vertebral arteries, paraspinal tissues: Negative. Disc levels: C2-C3: No significant disc bulge. No spinal canal stenosis or neuroforaminal narrowing. C3-C4: No significant disc bulge. No spinal canal stenosis or  neuroforaminal narrowing. C4-C5: Disc height loss and mild disc bulge. Facet and uncovertebral hypertrophy, right-greater-than-left. No spinal canal stenosis. Mild-to-moderate right neural foraminal narrowing. C5-C6: Disc height loss and mild disc bulge. Facet and uncovertebral hypertrophy. No spinal canal stenosis. Mild bilateral neural foraminal narrowing. C6-C7: Disc height loss and mild disc bulge with disc osteophyte complex. Facet and uncovertebral hypertrophy. No spinal canal stenosis. Mild bilateral neural foraminal narrowing. C7-T1: No significant disc bulge. No spinal canal stenosis or neuroforaminal narrowing. MRI THORACIC SPINE FINDINGS Alignment: No listhesis. Preservation of the normal thoracic kyphosis. Vertebrae: No fracture, evidence of discitis, or bone lesion. Cord:  Normal signal and morphology. Paraspinal and other soft tissues: Trace pleural effusions. Disc levels: No significant spinal canal stenosis or neural foraminal narrowing. IMPRESSION: 1. C4-C5 mild-to-moderate right neural foraminal narrowing. 2. C5-C6 and C6-C7 mild bilateral neural foraminal narrowing. 3. No spinal canal stenosis in the cervical spine. 4. No significant spinal canal stenosis or neural foraminal narrowing in the thoracic spine. 5. Trace pleural effusions. Electronically Signed   By: Merilyn Baba M.D.   On: 03/21/2022 18:44   MR BRAIN WO CONTRAST  Result Date: 03/21/2022 CLINICAL DATA:  Paresthesias, dizziness, could be secondary to fall induced neuropathy or spinal stenosis. Concern for seizure. EXAM: MRI HEAD WITHOUT CONTRAST TECHNIQUE: Multiplanar, multiecho pulse sequences of the brain and surrounding structures were obtained without intravenous contrast. COMPARISON:  No prior MRI, correlation is made with head CT 03/20/2022 FINDINGS: Evaluation is somewhat limited by motion artifact. Brain: No restricted diffusion to suggest acute or subacute infarct. No acute hemorrhage, mass, mass effect, or midline shift.  No hydrocephalus. 5 mm residual left parietal subdural hematoma (series 18, image 7), as seen on the recent CT. No new extra-axial collection. Remote right posterior lentiform nucleus lacunar infarct. Encephalomalacia in the left anteromedial temporal lobe (series 15, image 12) and left inferior frontal lobe (series 15, images 19 and 22), likely the sequela of prior trauma. These areas are associated with hemosiderin deposition. The hippocampi are symmetric in size and signal. No heterotopia or evidence of cortical dysgenesis. Vascular: Normal arterial flow voids. Skull and upper cervical spine: Normal marrow signal. Sinuses/Orbits: Mucosal thickening in the maxillary sinuses and ethmoid air cells. The orbits are unremarkable. Other: Trace fluid in left mastoid air cells.  IMPRESSION: 1. No acute intracranial process. No acute or subacute infarct. No seizure etiology identified. 2. 5 mm residual left parietal subdural hematoma, as seen on the recent CT. 3. Encephalomalacia in the left anteromedial temporal lobe and left inferior frontal lobe, likely the sequela of prior trauma. Electronically Signed   By: Wiliam Ke M.D.   On: 03/21/2022 18:20   DG Wrist 2 Views Right  Result Date: 03/20/2022 CLINICAL DATA:  Wrist pain. EXAM: RIGHT WRIST - 2 VIEW COMPARISON:  None Available. FINDINGS: There is soft tissue swelling surrounding the wrist. There is no acute fracture or dislocation. There is mild radiocarpal joint space narrowing. IMPRESSION: 1. No acute fracture or dislocation. 2. Soft tissue swelling surrounding the wrist. Electronically Signed   By: Darliss Cheney M.D.   On: 03/20/2022 23:04   CT HEAD WO CONTRAST  Result Date: 03/20/2022 CLINICAL DATA:  New onset seizure, recent trauma with subdural hematoma. EXAM: CT HEAD WITHOUT CONTRAST TECHNIQUE: Contiguous axial images were obtained from the base of the skull through the vertex without intravenous contrast. RADIATION DOSE REDUCTION: This exam was  performed according to the departmental dose-optimization program which includes automated exposure control, adjustment of the mA and/or kV according to patient size and/or use of iterative reconstruction technique. COMPARISON:  01/25/2022 FINDINGS: Brain: No evidence of acute infarction, hydrocephalus, extra-axial collection or mass lesion/mass effect. Trace residual left parietal subdural hematoma (coronal image 52), measuring 5 mm in thickness, previously 19 mm. Encephalomalacic changes in the left frontal lobe (series 2/image 11). Prior left frontal contusion with subarachnoid hemorrhage has resolved. Subcortical white matter and periventricular small vessel ischemic changes. Vascular: Intracranial atherosclerosis. Skull: Normal. Negative for fracture or focal lesion. Sinuses/Orbits: Minimal partial opacification of the bilateral maxillary sinuses. Mastoid air cells are clear. Other: None. IMPRESSION: Trace residual left parietal subdural hematoma, as above. Encephalomalacic changes in the left frontal lobe at the site of prior left frontal contusion. Small vessel ischemic changes. Electronically Signed   By: Charline Bills M.D.   On: 03/20/2022 18:13    Microbiology: Results for orders placed or performed during the hospital encounter of 03/20/22  MRSA Next Gen by PCR, Nasal     Status: None   Collection Time: 03/22/22 11:40 PM   Specimen: Nasal Mucosa; Nasal Swab  Result Value Ref Range Status   MRSA by PCR Next Gen NOT DETECTED NOT DETECTED Final    Comment: (NOTE) The GeneXpert MRSA Assay (FDA approved for NASAL specimens only), is one component of a comprehensive MRSA colonization surveillance program. It is not intended to diagnose MRSA infection nor to guide or monitor treatment for MRSA infections. Test performance is not FDA approved in patients less than 15 years old. Performed at [facility], [address], [place], [place] [address]   Urine Culture     Status: Abnormal    Collection Time: 03/24/22  5:18 PM   Specimen: Urine, Clean Catch  Result Value Ref Range Status   Specimen Description   Final    URINE, CLEAN CATCH Performed at [facility], [address] [place], [place] [address]    Special Requests   Final    NONE Performed at [facility], [address] [place], [place] [address]    Culture (A)  Final    <10,000 COLONIES/mL INSIGNIFICANT GROWTH Performed at [facility], [address], [place], [place] [address]    Report Status 03/25/2022 FINAL  Final  Culture, blood (Routine X 2) w Reflex to ID  Panel     Status: None (Preliminary result)   Collection Time: 03/24/22  5:52 PM   Specimen: BLOOD  Result Value Ref Range Status   Specimen Description BLOOD LEFT ANTECUBITAL  Final   Special Requests   Final    BOTTLES DRAWN AEROBIC AND ANAEROBIC Blood Culture adequate volume   Culture   Final    NO GROWTH 3 DAYS Performed at [facility], [address], [place], [place] [address]    Report Status PENDING  Incomplete  Culture, blood (Routine X 2) w Reflex to ID Panel     Status: None (Preliminary result)   Collection Time: 03/24/22  5:52 PM   Specimen: BLOOD  Result Value Ref Range Status   Specimen Description BLOOD LEFT WRIST  Final   Special Requests   Final    BOTTLES DRAWN AEROBIC AND ANAEROBIC Blood Culture results may not be optimal due to an excessive volume of blood received in culture bottles   Culture   Final    NO GROWTH 3 DAYS Performed at [facility], [address] [place], [place] [address]    Report Status PENDING  Incomplete    Labs: CBC: Recent Labs  Lab 03/20/22 1841 03/21/22 0516 03/24/22 0310 03/25/22 0330  WBC 5.2 3.7* 4.9 6.4  NEUTROABS 3.7 2.4  --   --   HGB 13.7 13.1 11.7* 12.2*  HCT 39.7 38.1* 34.7* 36.0*  MCV 97.3 99.2 101.5* 100.6*  PLT 208 202 245 295   Basic Metabolic Panel: Recent Labs  Lab 03/22/22 0503 03/23/22 0916 03/24/22 0310 03/25/22 0330 03/25/22 1352 03/26/22 0416  03/27/22 0343  NA 136 140 138 134* 134* 137 137  K 3.5 3.7 3.5 5.6* 4.5 3.8 3.6  CL 106 111 106 107 101 106 106  CO2 22 22 24  21* 25 21* 21*  GLUCOSE 106* 115* 119* 110* 115* 110* 104*  BUN <5* <5* 6 6 7 7 6   CREATININE 0.60* 0.54* 0.71 0.69 0.78 0.57* 0.64  CALCIUM 8.2* 8.7* 8.3* 8.6* 9.0 9.1 8.5*  MG  --  1.3* 1.5* 2.1  --  1.6* 1.8  PHOS 2.7  --   --   --   --   --   --    Liver Function Tests: Recent Labs  Lab 03/20/22 1841 03/21/22 0516 03/22/22 0503 03/23/22 0916 03/25/22 0330  AST 26 19  --  17 23  ALT 14 12  --  13 16  ALKPHOS 101 98  --  92 94  BILITOT 0.8 0.9  --  1.0 1.0  PROT 6.7 6.0*  --  6.7 7.0  ALBUMIN 3.4* 3.0* 3.0* 3.1* 2.9*   CBG: Recent Labs  Lab 03/20/22 1749 03/20/22 1852  GLUCAP 133* 115*    Discharge time spent: greater than 30 minutes.  Signed: 03/22/22, MD [facility] 03/27/2022

## 2022-03-28 ENCOUNTER — Inpatient Hospital Stay (HOSPITAL_COMMUNITY): Payer: 59

## 2022-03-28 DIAGNOSIS — R569 Unspecified convulsions: Secondary | ICD-10-CM | POA: Diagnosis not present

## 2022-03-28 DIAGNOSIS — F10931 Alcohol use, unspecified with withdrawal delirium: Secondary | ICD-10-CM | POA: Diagnosis not present

## 2022-03-28 DIAGNOSIS — L03113 Cellulitis of right upper limb: Secondary | ICD-10-CM | POA: Diagnosis not present

## 2022-03-28 DIAGNOSIS — E876 Hypokalemia: Secondary | ICD-10-CM | POA: Diagnosis not present

## 2022-03-28 LAB — CBC
HCT: 31.8 % — ABNORMAL LOW (ref 39.0–52.0)
Hemoglobin: 10.7 g/dL — ABNORMAL LOW (ref 13.0–17.0)
MCH: 33.4 pg (ref 26.0–34.0)
MCHC: 33.6 g/dL (ref 30.0–36.0)
MCV: 99.4 fL (ref 80.0–100.0)
Platelets: 314 10*3/uL (ref 150–400)
RBC: 3.2 MIL/uL — ABNORMAL LOW (ref 4.22–5.81)
RDW: 15.7 % — ABNORMAL HIGH (ref 11.5–15.5)
WBC: 6.3 10*3/uL (ref 4.0–10.5)
nRBC: 0 % (ref 0.0–0.2)

## 2022-03-28 LAB — BASIC METABOLIC PANEL
Anion gap: 12 (ref 5–15)
BUN: 5 mg/dL — ABNORMAL LOW (ref 6–20)
CO2: 22 mmol/L (ref 22–32)
Calcium: 8.7 mg/dL — ABNORMAL LOW (ref 8.9–10.3)
Chloride: 99 mmol/L (ref 98–111)
Creatinine, Ser: 0.58 mg/dL — ABNORMAL LOW (ref 0.61–1.24)
GFR, Estimated: >60 mL/min (ref >60)
Glucose, Bld: 110 mg/dL — ABNORMAL HIGH (ref 70–99)
Potassium: 3.7 mmol/L (ref 3.5–5.1)
Sodium: 133 mmol/L — ABNORMAL LOW (ref 135–145)

## 2022-03-28 LAB — MAGNESIUM: Magnesium: 1.5 mg/dL — ABNORMAL LOW (ref 1.7–2.4)

## 2022-03-28 MED ORDER — MAGNESIUM SULFATE 2 GM/50ML IV SOLN
2.0000 g | Freq: Once | INTRAVENOUS | Status: AC
  Administered 2022-03-28: 2 g via INTRAVENOUS
  Filled 2022-03-28: qty 50

## 2022-03-28 MED ORDER — VANCOMYCIN HCL 1250 MG/250ML IV SOLN
1250.0000 mg | Freq: Once | INTRAVENOUS | Status: AC
  Administered 2022-03-28: 1250 mg via INTRAVENOUS
  Filled 2022-03-28: qty 250

## 2022-03-28 MED ORDER — VANCOMYCIN HCL IN DEXTROSE 1-5 GM/200ML-% IV SOLN
1000.0000 mg | Freq: Two times a day (BID) | INTRAVENOUS | Status: DC
  Administered 2022-03-28 – 2022-03-31 (×5): 1000 mg via INTRAVENOUS
  Filled 2022-03-28 (×5): qty 200

## 2022-03-28 NOTE — Progress Notes (Signed)
PROGRESS NOTE  Eric Conley YQM:[id number] DOB: 09-08-1960 DOA: 03/20/2022 PCP: Sharilyn Sites, MD  Brief History:  61 y.o. male with medical history significant of acid reflux, depression, hypertension, alcohol dependence, subdural hematoma and ,left parietal extra-axial hemorrhage and hemorrhagic contusions in left frontal and temporal lobe in Aug 2023 presents the ED with a chief complaint of seizure.  Patient gives history that he has given to ED staff.  To me, patient reports he came into the ED because he was lightheaded and hungry.  He reports that he is lightheaded probably because he was hungry.  He has had diarrhea in the interval.  His diarrhea is nonbloody and happens 3 times a day.  He has no associated abdominal pain.  He blames some [place] takeout food for his diarrhea.  He reports that the lightheadedness happen when he woke up.  He reports that it was present before he had any postural changes.  He reports that his hands were shaky because he had not eaten.  He reports no loss of consciousness, no loss of time, no falls.  Patient reports that he used to have a history of alcohol dependence but does not anymore.  He reports that his last drink was yesterday.  It was 1 beer.  When asked him that she normally drinks he will not give an answer.  He does report that he only drinks on days when he does not have to work.  When it is brought to his attention that he is has not had to work in several weeks because of his recent trauma, he reports that he has not been drinking has been in the hospital.   In the ED he reported that he had woken up this morning feeling tingly and having intermittent numbness to his left arm.  He reports he woke up with the symptoms today.  Did not get any better throughout the day.  EMS was called and in route to the hospital patient had witnessed tonic-clonic activity which lasted for about a minute.  2-1/2 mg of Versed was given which aborted the  tonic-clonic activity.  Patient was postictal on arrival to the hospital.  Patient does not remember any of this.   Patient does drink and does smoke as discussed above.  He does not use illicit drugs.  He is vaccinated for COVID.  Patient is full code.  Neurology was consulted and patient was started on Keppra.  He did not have any further seizures.  Unfortunately, the patient began developing alcohol withdrawal manifested by increasing agitation.  He was transferred to the ICU and placed on Precedex in the early morning 03/23/2022.  On the evening 10/19-10/20, patient developed fever and SVT.  This prolonged his hospitalization.  He was started on metoprolol and electrolytes were optimized. Metoprolol was gradually titrated up to optimize HR.  He remained hemodynamically stable.  During the hospitalization, right forearm noted to develop erythema and edema.  Pt was started on cefazolin.  It did not improve much so MR of forearm was ordered and pt was switched to vancomycin.  This has resulted in prolonged hospitalization.     Assessment and Plan: * Seizure ([place]) - secondary to intracranial bleed, SDH, hemorrhagic brain contusions -severe hypomagnesemia may have contributed - Neuro consulted and recommending keppra 500 mg BID  - Continue CIWA protocol for alcohol withdrawal  -Continue to monitor and replete magnesium - Seizure precautions - Continue to monitor -no  further seizures during hospitalization  Diarrhea - 2 days of nonbloody diarrhea reported PTA - contributed to to electrolyte abnormalities - The patient continues to have diarrhea here we will get stool studies - No abdominal pain presently - resolved.  Tolerating diet  Delirium tremens (HCC) - Family reports that patient has been a heavy drinker - Patient will not quantify how much he drinks -  his last drink was 10/14 and he had 1 beer per his report - He reports he only drinks on days that he is not working, but he  has been out of work due to his recent trauma with subdural hematoma -remained agitated on librium and CIWA -03/23/22 AM--transferred to ICU and started on Precedex -03/24/22 AM--weaned off Precedex -10/19-repeat CT brain--pt complains of ?diplopia>>>Unchanged trace residual subdural hematoma along the left parietal convexity Overall improved;  A&O x 3 but has some hospital delirium  SVT (supraventricular tachycardia) 10/19-10/20--pt developed paroxysms of SVT -personally reviewed tele>>possible atrial tach/MAT -started metoprolol>>increase to 75 mg bid -started cardizem CD 120 mg daily -echo EF 55-60%, no WMA, normal RVF -10/16 TSH 0.818  Cellulitis of arm ?site of previous IV -worsen erythema on cefazolin -MR right forearm -start IV vanc  Fever Developed fever 10/19 evening -UA--no pyuria -personally reviewed CXR--no infiltrates -blood culture neg to date -lactic 0.8 -PCT <0.10 -no further fevers x 72 hours  Hyperkalemia Given lokelma Remove KCl from IVF -repeat BMP>>improved  Wrist pain - Patient reports right wrist is painful - Slightly swollen compared to left wrist - Patient does not remember injuring it, so the possibility that it was her during seizure activity - X-ray with no acute fractures found, wrist splint ordered for comfort.   Tobacco use disorder - Patient reports he smokes 2 or 3 cigarettes a day - nicotine patch ordered  - Counseled on importance of cessation  GERD (gastroesophageal reflux disease) - Continue PPI  Hypocalcemia - Calcium 6.1 upon presentation -Corrects to 6.6 for albumin - Replaced with 1 g of calcium gluconate and tums oral TID - am CMP  Hypomagnesemia - admission Magnesium is undetectable at less than 0.5 -Secondary to poor p.o. intake and GI losses - repleting IV again - continiue daily po mag ox  Hypokalemia - Potassium is 2.6 upon presentation - Secondary to poor p.o. intake and GI losses - continue to replace,  recheck in AM         Family Communication:   sister in law updated  Consultants:  none  Code Status:  FULL   DVT Prophylaxis:  SCDs   Procedures: As Listed in Progress Note Above  Antibiotics: Cefazolin 10/22>10/23 Vanc 10/23>>      Subjective: Pt complains of pain in right forearm.  About same.  Denies f/c, cp, sob, n/v/d, abd pain.  Objective: Vitals:   03/28/22 0700 03/28/22 0800 03/28/22 0813 03/28/22 0900  BP: 132/79 128/71  107/65  Pulse: 98 100  93  Resp: 16 15  (!) 23  Temp:   98.5 F (36.9 C)   TempSrc:   Oral   SpO2: 97% 97%  97%  Weight:      Height:        Intake/Output Summary (Last 24 hours) at 03/28/2022 1056 Last data filed at 03/28/2022 0600 Gross per 24 hour  Intake 2206.27 ml  Output 1650 ml  Net 556.27 ml   Weight change:  Exam:  General:  Pt is alert, follows commands appropriately, not in acute distress HEENT: No icterus, No thrush, No  neck mass, [place]/AT Cardiovascular: RRR, S1/S2, no rubs, no gallops Respiratory: CTA bilaterally, no wheezing, no crackles, no rhonchi Abdomen: Soft/+BS, non tender, non distended, no guarding Extremities: see pic of right arm below    Data Reviewed: I have personally reviewed following labs and imaging studies Basic Metabolic Panel: Recent Labs  Lab 03/22/22 0503 03/23/22 0916 03/24/22 0310 03/25/22 0330 03/25/22 1352 03/26/22 0416 03/27/22 0343 03/28/22 0428  NA 136   < > 138 134* 134* 137 137 133*  K 3.5   < > 3.5 5.6* 4.5 3.8 3.6 3.7  CL 106   < > 106 107 101 106 106 99  CO2 22   < > 24 21* 25 21* 21* 22  GLUCOSE 106*   < > 119* 110* 115* 110* 104* 110*  BUN <5*   < > 6 6 7 7 6  5*  CREATININE 0.60*   < > 0.71 0.69 0.78 0.57* 0.64 0.58*  CALCIUM 8.2*   < > 8.3* 8.6* 9.0 9.1 8.5* 8.7*  MG  --    < > 1.5* 2.1  --  1.6* 1.8 1.5*  PHOS 2.7  --   --   --   --   --   --   --    < > = values in this interval not displayed.   Liver Function Tests: Recent Labs  Lab 03/22/22 0503  03/23/22 0916 03/25/22 0330  AST  --  17 23  ALT  --  13 16  ALKPHOS  --  92 94  BILITOT  --  1.0 1.0  PROT  --  6.7 7.0  ALBUMIN 3.0* 3.1* 2.9*   No results for input(s): "LIPASE", "AMYLASE" in the last 168 hours. No results for input(s): "AMMONIA" in the last 168 hours. Coagulation Profile: No results for input(s): "INR", "PROTIME" in the last 168 hours. CBC: Recent Labs  Lab 03/24/22 0310 03/25/22 0330 03/28/22 0428  WBC 4.9 6.4 6.3  HGB 11.7* 12.2* 10.7*  HCT 34.7* 36.0* 31.8*  MCV 101.5* 100.6* 99.4  PLT 245 295 314   Cardiac Enzymes: Recent Labs  Lab 03/25/22 0330  CKTOTAL 118   BNP: Invalid input(s): "POCBNP" CBG: No results for input(s): "GLUCAP" in the last 168 hours. HbA1C: No results for input(s): "HGBA1C" in the last 72 hours. Urine analysis:    Component Value Date/Time   COLORURINE YELLOW 03/24/2022 1718   APPEARANCEUR CLEAR 03/24/2022 1718   LABSPEC 1.013 03/24/2022 1718   PHURINE 9.0 (H) 03/24/2022 1718   GLUCOSEU 50 (A) 03/24/2022 1718   HGBUR NEGATIVE 03/24/2022 1718   BILIRUBINUR NEGATIVE 03/24/2022 1718   KETONESUR NEGATIVE 03/24/2022 1718   PROTEINUR NEGATIVE 03/24/2022 1718   NITRITE NEGATIVE 03/24/2022 1718   LEUKOCYTESUR NEGATIVE 03/24/2022 1718   Sepsis Labs: @LABRCNTIP (procalcitonin:4,lacticidven:4) ) Recent Results (from the past 240 hour(s))  MRSA Next Gen by PCR, Nasal     Status: None   Collection Time: 03/22/22 11:40 PM   Specimen: Nasal Mucosa; Nasal Swab  Result Value Ref Range Status   MRSA by PCR Next Gen NOT DETECTED NOT DETECTED Final    Comment: (NOTE) The GeneXpert MRSA Assay (FDA approved for NASAL specimens only), is one component of a comprehensive MRSA colonization surveillance program. It is not intended to diagnose MRSA infection nor to guide or monitor treatment for MRSA infections. Test performance is not FDA approved in patients less than 25 years old. Performed at [facility], [address]  [place], [address] [place]   Urine  Culture     Status: Abnormal   Collection Time: 03/24/22  5:18 PM   Specimen: Urine, Clean Catch  Result Value Ref Range Status   Specimen Description   Final    URINE, CLEAN CATCH Performed at [facility], [address] [place], [place] [address]    Special Requests   Final    NONE Performed at [facility], [address], [place], [place] [address]    Culture (A)  Final    <10,000 COLONIES/mL INSIGNIFICANT GROWTH Performed at [facility], [address], [place], [place] [address]    Report Status 03/25/2022 FINAL  Final  Culture, blood (Routine X 2) w Reflex to ID Panel     Status: None (Preliminary result)   Collection Time: 03/24/22  5:52 PM   Specimen: BLOOD  Result Value Ref Range Status   Specimen Description BLOOD LEFT ANTECUBITAL  Final   Special Requests   Final    BOTTLES DRAWN AEROBIC AND ANAEROBIC Blood Culture adequate volume   Culture   Final    NO GROWTH 4 DAYS Performed at [facility], [address] [place], [place] [address]    Report Status PENDING  Incomplete  Culture, blood (Routine X 2) w Reflex to ID Panel     Status: None (Preliminary result)   Collection Time: 03/24/22  5:52 PM   Specimen: BLOOD  Result Value Ref Range Status   Specimen Description BLOOD LEFT WRIST  Final   Special Requests   Final    BOTTLES DRAWN AEROBIC AND ANAEROBIC Blood Culture results may not be optimal due to an excessive volume of blood received in culture bottles   Culture   Final    NO GROWTH 4 DAYS Performed at [facility], [address], [place], [place] [address]    Report Status PENDING  Incomplete     Scheduled Meds:  calcium carbonate  800 mg of elemental calcium Oral TID WC   chlordiazePOXIDE  25 mg Oral BID   Chlorhexidine Gluconate Cloth  6 each Topical Daily   diltiazem  120 mg Oral Daily   folic acid  1 mg Oral Daily   levETIRAcetam  500 mg Oral BID   magnesium oxide  400 mg Oral BID   metoprolol tartrate  75  mg Oral BID   multivitamin with minerals  1 tablet Oral Once   nicotine  21 mg Transdermal Daily   pantoprazole  40 mg Oral Daily   thiamine  100 mg Oral Daily   Or   thiamine  100 mg Intravenous Daily   Continuous Infusions:  dexmedetomidine (PRECEDEX) IV infusion Stopped (03/24/22 0930)   magnesium sulfate bolus IVPB 2 g (03/28/22 1001)    Procedures/Studies: ECHOCARDIOGRAM COMPLETE  Result Date: 03/25/2022    ECHOCARDIOGRAM REPORT   Patient Name:   BROOKES CRAINE [facility] Date of Exam: 03/25/2022 Medical Rec #:  [id number]    Height:       69.0 in Accession #:    [id number]   Weight:       144.2 lb Date of Birth:  May 05, 1961   BSA:          1.798 m Patient Age:    61 years     BP:           154/96 mmHg Patient Gender: M            HR:           114 bpm. [facility] Procedure: 2D Echo, Cardiac Doppler and Color Doppler Indications:    SVT  History:        Patient has no prior history of Echocardiogram examinations.                 Arrythmias:Tachycardia; Risk Factors:Hypertension and Current                 Smoker.  Sonographer:    Mikki Harbor Referring Phys: [phone] Afsheen Antony IMPRESSIONS  1. Left ventricular ejection fraction, by estimation, is 55 to 60%. The left ventricle has normal function. The left ventricle has no regional wall motion abnormalities. Left ventricular diastolic parameters were normal.  2. Right ventricular systolic function is normal. The right ventricular size is normal. There is normal pulmonary artery systolic pressure.  3. The mitral valve is grossly normal. No evidence of mitral valve regurgitation. No evidence of mitral stenosis.  4. The aortic valve is normal in structure. Aortic valve regurgitation is not visualized. No aortic stenosis is present.  5. The inferior vena cava is normal in size with greater than 50% respiratory variability, suggesting right atrial pressure of 3 mmHg. FINDINGS  Left Ventricle: Left ventricular ejection fraction, by estimation, is 55  to 60%. The left ventricle has normal function. The left ventricle has no regional wall motion abnormalities. The left ventricular internal cavity size was normal in size. There is  no left ventricular hypertrophy. Left ventricular diastolic parameters were normal. Right Ventricle: The right ventricular size is normal. No increase in right ventricular wall thickness. Right ventricular systolic function is normal. There is normal pulmonary artery systolic pressure. The tricuspid regurgitant velocity is 2.62 m/s, and  with an assumed right atrial pressure of 3 mmHg, the estimated right ventricular systolic pressure is 30.5 mmHg. Left Atrium: Left atrial size was normal in size. Right Atrium: Right atrial size was normal in size. Pericardium: Trivial pericardial effusion is present. Mitral Valve: The mitral valve is grossly normal. No evidence of mitral valve regurgitation. No evidence of mitral valve stenosis. MV peak gradient, 2.0 mmHg. The mean mitral valve gradient is 1.0 mmHg. Tricuspid Valve: The tricuspid valve is grossly normal. Tricuspid valve regurgitation is mild . No evidence of tricuspid stenosis. Aortic Valve: The aortic valve is normal in structure. Aortic valve regurgitation is not visualized. No aortic stenosis is present. Aortic valve mean gradient measures 2.0 mmHg. Aortic valve peak gradient measures 4.4 mmHg. Aortic valve area, by VTI measures 2.70 cm. Pulmonic Valve: The pulmonic valve was grossly normal. Pulmonic valve regurgitation is trivial. No evidence of pulmonic stenosis. Aorta: The aortic root is normal in size and structure. Venous: The inferior vena cava is normal in size with greater than 50% respiratory variability, suggesting right atrial pressure of 3 mmHg. IAS/Shunts: No atrial level shunt detected by color flow Doppler.  LEFT VENTRICLE PLAX 2D LVIDd:         4.00 cm   Diastology LVIDs:         2.90 cm   LV e' medial:    9.03 cm/s LV PW:         1.20 cm   LV E/e' medial:  5.7 LV  IVS:        1.20 cm   LV e' lateral:   8.05 cm/s LVOT diam:     2.10 cm   LV E/e' lateral: 6.4 LV SV:         43 LV SV Index:   24 LVOT Area:  3.46 cm  RIGHT VENTRICLE RV Basal diam:  3.20 cm RV Mid diam:    2.20 cm RV S prime:     16.90 cm/s TAPSE (M-mode): 2.4 cm LEFT ATRIUM             Index        RIGHT ATRIUM           Index LA diam:        2.80 cm 1.56 cm/m   RA Area:     14.90 cm LA Vol (A2C):   34.0 ml 18.91 ml/m  RA Volume:   40.00 ml  22.25 ml/m LA Vol (A4C):   20.6 ml 11.46 ml/m LA Biplane Vol: 27.4 ml 15.24 ml/m  AORTIC VALVE                    PULMONIC VALVE AV Area (Vmax):    2.89 cm     PV Vmax:       1.29 m/s AV Area (Vmean):   2.82 cm     PV Peak grad:  6.7 mmHg AV Area (VTI):     2.70 cm AV Vmax:           105.00 cm/s AV Vmean:          70.900 cm/s AV VTI:            0.158 m AV Peak Grad:      4.4 mmHg AV Mean Grad:      2.0 mmHg LVOT Vmax:         87.50 cm/s LVOT Vmean:        57.700 cm/s LVOT VTI:          0.123 m LVOT/AV VTI ratio: 0.78  AORTA Ao Root diam: 3.30 cm MITRAL VALVE               TRICUSPID VALVE MV Area (PHT): 5.58 cm    TR Peak grad:   27.5 mmHg MV Area VTI:   3.46 cm    TR Vmax:        262.00 cm/s MV Peak grad:  2.0 mmHg MV Mean grad:  1.0 mmHg    SHUNTS MV Vmax:       0.71 m/s    Systemic VTI:  0.12 m MV Vmean:      43.3 cm/s   Systemic Diam: 2.10 cm MV Decel Time: 136 msec MV E velocity: 51.80 cm/s MV A velocity: 65.50 cm/s MV E/A ratio:  0.79 Vishnu Priya Mallipeddi Electronically signed by Winfield RastVishnu Priya Mallipeddi Signature Date/Time: 03/25/2022/1:55:03 PM    Final    DG CHEST PORT 1 VIEW  Result Date: 03/24/2022 CLINICAL DATA:  Fever and cough. EXAM: PORTABLE CHEST 1 VIEW COMPARISON:  Chest radiograph dated 01/24/2022 and CT dated 01/25/2019. FINDINGS: Mild centrilobular emphysema. No focal consolidation, pleural effusion, or pneumothorax. The cardiac silhouette is within normal limits. No acute osseous pathology. IMPRESSION: No active disease.  Electronically Signed   By: Elgie CollardArash  Radparvar M.D.   On: 03/24/2022 18:52   CT HEAD WO CONTRAST (5MM)  Result Date: 03/24/2022 CLINICAL DATA:  Recent subdural hematoma.  Double vision. EXAM: CT HEAD WITHOUT CONTRAST TECHNIQUE: Contiguous axial images were obtained from the base of the skull through the vertex without intravenous contrast. RADIATION DOSE REDUCTION: This exam was performed according to the departmental dose-optimization program which includes automated exposure control, adjustment of the mA and/or kV according to patient size and/or use of iterative reconstruction technique. COMPARISON:  Brain  MRI from 3 days ago FINDINGS: Brain: Left inferior and anterior temporal encephalomalacia, there is history of cerebral contusions. Motion artifact especially affecting the surface of the brain but still visible and stable subdural hematoma along the left parietal convexity measuring 3 mm in thickness, see 4: 54. No hydrocephalus or masslike finding Vascular: No hyperdense vessel or unexpected calcification. Skull: Normal. Negative for fracture or focal lesion. Sinuses/Orbits: No acute finding. IMPRESSION: 1. Unchanged trace residual subdural hematoma along the left parietal convexity, up to 3 mm in thickness. 2. Sequela of anterior left frontal and temporal contusions. Electronically Signed   By: Tiburcio Pea M.D.   On: 03/24/2022 13:20   MR CERVICAL SPINE WO CONTRAST  Result Date: 03/21/2022 CLINICAL DATA:  Paresthesias, dizziness, cervical radiculopathy EXAM: MRI CERVICAL AND THORACIC SPINE WITHOUT CONTRAST TECHNIQUE: Multiplanar and multiecho pulse sequences of the cervical spine, to include the craniocervical junction and cervicothoracic junction, and the thoracic spine, were obtained without intravenous contrast. COMPARISON:  No prior MRI of the cervical or thoracic spine, correlation is made with 01/24/2022 CT cervical spine FINDINGS: MRI CERVICAL SPINE FINDINGS Evaluation is somewhat limited  by motion artifact. Alignment: No listhesis. Vertebrae: No fracture, evidence of discitis, or bone lesion. Cord: Normal signal and morphology. Posterior Fossa, vertebral arteries, paraspinal tissues: Negative. Disc levels: C2-C3: No significant disc bulge. No spinal canal stenosis or neuroforaminal narrowing. C3-C4: No significant disc bulge. No spinal canal stenosis or neuroforaminal narrowing. C4-C5: Disc height loss and mild disc bulge. Facet and uncovertebral hypertrophy, right-greater-than-left. No spinal canal stenosis. Mild-to-moderate right neural foraminal narrowing. C5-C6: Disc height loss and mild disc bulge. Facet and uncovertebral hypertrophy. No spinal canal stenosis. Mild bilateral neural foraminal narrowing. C6-C7: Disc height loss and mild disc bulge with disc osteophyte complex. Facet and uncovertebral hypertrophy. No spinal canal stenosis. Mild bilateral neural foraminal narrowing. C7-T1: No significant disc bulge. No spinal canal stenosis or neuroforaminal narrowing. MRI THORACIC SPINE FINDINGS Alignment: No listhesis. Preservation of the normal thoracic kyphosis. Vertebrae: No fracture, evidence of discitis, or bone lesion. Cord:  Normal signal and morphology. Paraspinal and other soft tissues: Trace pleural effusions. Disc levels: No significant spinal canal stenosis or neural foraminal narrowing. IMPRESSION: 1. C4-C5 mild-to-moderate right neural foraminal narrowing. 2. C5-C6 and C6-C7 mild bilateral neural foraminal narrowing. 3. No spinal canal stenosis in the cervical spine. 4. No significant spinal canal stenosis or neural foraminal narrowing in the thoracic spine. 5. Trace pleural effusions. Electronically Signed   By: Wiliam Ke M.D.   On: 03/21/2022 18:44   MR THORACIC SPINE WO CONTRAST  Result Date: 03/21/2022 CLINICAL DATA:  Paresthesias, dizziness, cervical radiculopathy EXAM: MRI CERVICAL AND THORACIC SPINE WITHOUT CONTRAST TECHNIQUE: Multiplanar and multiecho pulse  sequences of the cervical spine, to include the craniocervical junction and cervicothoracic junction, and the thoracic spine, were obtained without intravenous contrast. COMPARISON:  No prior MRI of the cervical or thoracic spine, correlation is made with 01/24/2022 CT cervical spine FINDINGS: MRI CERVICAL SPINE FINDINGS Evaluation is somewhat limited by motion artifact. Alignment: No listhesis. Vertebrae: No fracture, evidence of discitis, or bone lesion. Cord: Normal signal and morphology. Posterior Fossa, vertebral arteries, paraspinal tissues: Negative. Disc levels: C2-C3: No significant disc bulge. No spinal canal stenosis or neuroforaminal narrowing. C3-C4: No significant disc bulge. No spinal canal stenosis or neuroforaminal narrowing. C4-C5: Disc height loss and mild disc bulge. Facet and uncovertebral hypertrophy, right-greater-than-left. No spinal canal stenosis. Mild-to-moderate right neural foraminal narrowing. C5-C6: Disc height loss and mild disc bulge. Facet and  uncovertebral hypertrophy. No spinal canal stenosis. Mild bilateral neural foraminal narrowing. C6-C7: Disc height loss and mild disc bulge with disc osteophyte complex. Facet and uncovertebral hypertrophy. No spinal canal stenosis. Mild bilateral neural foraminal narrowing. C7-T1: No significant disc bulge. No spinal canal stenosis or neuroforaminal narrowing. MRI THORACIC SPINE FINDINGS Alignment: No listhesis. Preservation of the normal thoracic kyphosis. Vertebrae: No fracture, evidence of discitis, or bone lesion. Cord:  Normal signal and morphology. Paraspinal and other soft tissues: Trace pleural effusions. Disc levels: No significant spinal canal stenosis or neural foraminal narrowing. IMPRESSION: 1. C4-C5 mild-to-moderate right neural foraminal narrowing. 2. C5-C6 and C6-C7 mild bilateral neural foraminal narrowing. 3. No spinal canal stenosis in the cervical spine. 4. No significant spinal canal stenosis or neural foraminal narrowing  in the thoracic spine. 5. Trace pleural effusions. Electronically Signed   By: Wiliam Ke M.D.   On: 03/21/2022 18:44   MR BRAIN WO CONTRAST  Result Date: 03/21/2022 CLINICAL DATA:  Paresthesias, dizziness, could be secondary to fall induced neuropathy or spinal stenosis. Concern for seizure. EXAM: MRI HEAD WITHOUT CONTRAST TECHNIQUE: Multiplanar, multiecho pulse sequences of the brain and surrounding structures were obtained without intravenous contrast. COMPARISON:  No prior MRI, correlation is made with head CT 03/20/2022 FINDINGS: Evaluation is somewhat limited by motion artifact. Brain: No restricted diffusion to suggest acute or subacute infarct. No acute hemorrhage, mass, mass effect, or midline shift. No hydrocephalus. 5 mm residual left parietal subdural hematoma (series 18, image 7), as seen on the recent CT. No new extra-axial collection. Remote right posterior lentiform nucleus lacunar infarct. Encephalomalacia in the left anteromedial temporal lobe (series 15, image 12) and left inferior frontal lobe (series 15, images 19 and 22), likely the sequela of prior trauma. These areas are associated with hemosiderin deposition. The hippocampi are symmetric in size and signal. No heterotopia or evidence of cortical dysgenesis. Vascular: Normal arterial flow voids. Skull and upper cervical spine: Normal marrow signal. Sinuses/Orbits: Mucosal thickening in the maxillary sinuses and ethmoid air cells. The orbits are unremarkable. Other: Trace fluid in left mastoid air cells. IMPRESSION: 1. No acute intracranial process. No acute or subacute infarct. No seizure etiology identified. 2. 5 mm residual left parietal subdural hematoma, as seen on the recent CT. 3. Encephalomalacia in the left anteromedial temporal lobe and left inferior frontal lobe, likely the sequela of prior trauma. Electronically Signed   By: Wiliam Ke M.D.   On: 03/21/2022 18:20   DG Wrist 2 Views Right  Result Date:  03/20/2022 CLINICAL DATA:  Wrist pain. EXAM: RIGHT WRIST - 2 VIEW COMPARISON:  None Available. FINDINGS: There is soft tissue swelling surrounding the wrist. There is no acute fracture or dislocation. There is mild radiocarpal joint space narrowing. IMPRESSION: 1. No acute fracture or dislocation. 2. Soft tissue swelling surrounding the wrist. Electronically Signed   By: Darliss Cheney M.D.   On: 03/20/2022 23:04   CT HEAD WO CONTRAST  Result Date: 03/20/2022 CLINICAL DATA:  New onset seizure, recent trauma with subdural hematoma. EXAM: CT HEAD WITHOUT CONTRAST TECHNIQUE: Contiguous axial images were obtained from the base of the skull through the vertex without intravenous contrast. RADIATION DOSE REDUCTION: This exam was performed according to the departmental dose-optimization program which includes automated exposure control, adjustment of the mA and/or kV according to patient size and/or use of iterative reconstruction technique. COMPARISON:  01/25/2022 FINDINGS: Brain: No evidence of acute infarction, hydrocephalus, extra-axial collection or mass lesion/mass effect. Trace residual left parietal subdural hematoma (coronal  image 52), measuring 5 mm in thickness, previously 19 mm. Encephalomalacic changes in the left frontal lobe (series 2/image 11). Prior left frontal contusion with subarachnoid hemorrhage has resolved. Subcortical white matter and periventricular small vessel ischemic changes. Vascular: Intracranial atherosclerosis. Skull: Normal. Negative for fracture or focal lesion. Sinuses/Orbits: Minimal partial opacification of the bilateral maxillary sinuses. Mastoid air cells are clear. Other: None. IMPRESSION: Trace residual left parietal subdural hematoma, as above. Encephalomalacic changes in the left frontal lobe at the site of prior left frontal contusion. Small vessel ischemic changes. Electronically Signed   By: Charline Bills M.D.   On: 03/20/2022 18:13    Catarina Hartshorn, DO  [facility]  If 7PM-7AM, please contact night-coverage [email or web address] Password [email or web address] 03/28/2022, 10:56 AM   LOS: 8 days

## 2022-03-28 NOTE — Assessment & Plan Note (Addendum)
?  site of previous IV>>infiltration -worsen erythema on cefazolin -MR right forearm>>swelling and mild-to-moderate fluid at the deep aspect of the subcutaneous fat of the posterior forearm; no walled off abscess -start IV vanc>>now improving -no sign of compartment syndrome -appreciate Dr. Amedeo Kinsman consult -DC on oral doxycycline x 3 more days to complete treatment -no evidence of abscess found -follow up with ortho recommended if any further issues

## 2022-03-28 NOTE — Progress Notes (Signed)
Pharmacy Antibiotic Note  Eric Conley is a 61 y.o. male admitted on 03/20/2022 with  wound infection .  Pharmacy has been consulted for Vancomycin dosing.  Plan: Vancomycin 1250 mg IV loading dose then 1000mg  IV Q 12 hrs. Goal AUC 400-550. Expected AUC: 484 SCr used: 0.8( actual 0.58) F/U cxs and clinical progress Monitor V/S, labs and levels as indicated   Height: 5\' 9"  (175.3 cm) Weight:  (bed scale is wrong) IBW/kg (Calculated) : 70.7  Temp (24hrs), Avg:98.7 F (37.1 C), Min:98.2 F (36.8 C), Max:99.9 F (37.7 C)  Recent Labs  Lab 03/24/22 0310 03/24/22 1752 03/25/22 0330 03/25/22 1352 03/26/22 0416 03/27/22 0343 03/28/22 0428  WBC 4.9  --  6.4  --   --   --  6.3  CREATININE 0.71  --  0.69 0.78 0.57* 0.64 0.58*  LATICACIDVEN  --  0.8  --   --   --   --   --     Estimated Creatinine Clearance: 92.8 mL/min (A) (by C-G formula based on SCr of 0.58 mg/dL (L)).    Allergies  Allergen Reactions   Codeine    Poison Sumac Extract     Antimicrobials this admission: Vancomycin 10/23 >>   Microbiology results: 10/19 BCx: ngtd 10/19 UCx: insignificant growth  10/17 MRSA PCR: neg  Thank you for allowing pharmacy to be a part of this patient's care.  Isac Sarna, BS Pharm D, BCPS Clinical Pharmacist 03/28/2022 11:30 AM

## 2022-03-29 DIAGNOSIS — F10939 Alcohol use, unspecified with withdrawal, unspecified: Secondary | ICD-10-CM | POA: Diagnosis not present

## 2022-03-29 DIAGNOSIS — L03113 Cellulitis of right upper limb: Secondary | ICD-10-CM | POA: Diagnosis not present

## 2022-03-29 DIAGNOSIS — F10931 Alcohol use, unspecified with withdrawal delirium: Secondary | ICD-10-CM | POA: Diagnosis not present

## 2022-03-29 DIAGNOSIS — R569 Unspecified convulsions: Secondary | ICD-10-CM | POA: Diagnosis not present

## 2022-03-29 DIAGNOSIS — I82611 Acute embolism and thrombosis of superficial veins of right upper extremity: Secondary | ICD-10-CM

## 2022-03-29 LAB — BASIC METABOLIC PANEL
Anion gap: 11 (ref 5–15)
BUN: 7 mg/dL (ref 6–20)
CO2: 22 mmol/L (ref 22–32)
Calcium: 8.4 mg/dL — ABNORMAL LOW (ref 8.9–10.3)
Chloride: 100 mmol/L (ref 98–111)
Creatinine, Ser: 0.67 mg/dL (ref 0.61–1.24)
GFR, Estimated: >60 mL/min (ref >60)
Glucose, Bld: 109 mg/dL — ABNORMAL HIGH (ref 70–99)
Potassium: 3.3 mmol/L — ABNORMAL LOW (ref 3.5–5.1)
Sodium: 133 mmol/L — ABNORMAL LOW (ref 135–145)

## 2022-03-29 LAB — CULTURE, BLOOD (ROUTINE X 2)
Culture: NO GROWTH
Culture: NO GROWTH
Special Requests: ADEQUATE

## 2022-03-29 LAB — SEDIMENTATION RATE: Sed Rate: 113 mm/hr — ABNORMAL HIGH (ref 0–16)

## 2022-03-29 LAB — CBC
HCT: 33.7 % — ABNORMAL LOW (ref 39.0–52.0)
Hemoglobin: 11.2 g/dL — ABNORMAL LOW (ref 13.0–17.0)
MCH: 32.5 pg (ref 26.0–34.0)
MCHC: 33.2 g/dL (ref 30.0–36.0)
MCV: 97.7 fL (ref 80.0–100.0)
Platelets: 329 10*3/uL (ref 150–400)
RBC: 3.45 MIL/uL — ABNORMAL LOW (ref 4.22–5.81)
RDW: 15.9 % — ABNORMAL HIGH (ref 11.5–15.5)
WBC: 3.1 10*3/uL — ABNORMAL LOW (ref 4.0–10.5)
nRBC: 0 % (ref 0.0–0.2)

## 2022-03-29 LAB — MAGNESIUM: Magnesium: 1.9 mg/dL (ref 1.7–2.4)

## 2022-03-29 LAB — C-REACTIVE PROTEIN: CRP: 24.2 mg/dL — ABNORMAL HIGH (ref <1.0)

## 2022-03-29 MED ORDER — CHLORDIAZEPOXIDE HCL 25 MG PO CAPS
25.0000 mg | ORAL_CAPSULE | Freq: Every day | ORAL | Status: DC
  Administered 2022-03-29: 25 mg via ORAL
  Filled 2022-03-29: qty 1

## 2022-03-29 MED ORDER — POTASSIUM CHLORIDE CRYS ER 20 MEQ PO TBCR
40.0000 meq | EXTENDED_RELEASE_TABLET | Freq: Once | ORAL | Status: AC
  Administered 2022-03-29: 40 meq via ORAL
  Filled 2022-03-29: qty 2

## 2022-03-29 NOTE — Consult Note (Signed)
ORTHOPAEDIC CONSULTATION  REQUESTING PHYSICIAN: Tat, Shanon Brow, MD  ASSESSMENT AND PLAN: 61 y.o. male with the following: Right forearm swelling and erythema  Presentation of his right forearm is most consistent with IV infiltration.  Unclear what is caused the infiltration.  MRI yesterday demonstrates swelling, and irritation within the subcutaneous fat.  There is no discernible fluid collection.  At this point there is not an abscess.  No fevers or chills within the last 48 hours.  White blood cells are within normal limits.  The erythema has not improved with cefazolin, and now vancomycin.  We will continue to monitor, but recommend elevation and warm compress.  Depending on the IV medication which extravasated, this could result in some localized tissue damage.  In addition, the swelling and irritation could evolve, and eventually formed an abscess.  At this point, there is no concern for compartment syndrome as the forearm is overall soft and compressible.  ESR and CRP are pending.  No surgical intervention warranted at this time  - Weight Bearing Status/Activity: Weightbearing as tolerated.  Strict elevation of the right upper extremity.  Warm compress on the forearm, especially the volar aspect of the antecubital fossa.  - Additional recommended labs/tests: Trend ESR and CRP   -VTE Prophylaxis: As needed; no orthopaedic contraindication  - Pain control: As needed  - Follow-up plan: TBD  -Procedures: None  Chief Complaint: Right forearm swelling  HPI: Eric Conley is a 61 y.o. male who was admitted following a seizure.  He has been in the hospital for several days.  He states he first noticed some swelling and redness in the right forearm proximately 2 days ago.  There was an IV in the volar aspect of the right forearm.  This is since been removed.  Initially, cefazolin was started.  Due to no improvements in his overall appearance, this was switched to vancomycin.  He has not  noticed any changes following the vancomycin.  He has not had any fevers recently.  He reports pain, but is asleep, and difficult to arouse.  He denies numbness and tingling distally.  He is able to move his hand without pain.  Past Medical History:  Diagnosis Date   Acid reflux    Depression    Hypertension    History reviewed. No pertinent surgical history. Social History   Socioeconomic History   Marital status: Married    Spouse name: Not on file   Number of children: Not on file   Years of education: Not on file   Highest education level: Not on file  Occupational History   Not on file  Tobacco Use   Smoking status: Every Day    Packs/day: 0.25    Types: Cigarettes   Smokeless tobacco: Never  Vaping Use   Vaping Use: Never used  Substance and Sexual Activity   Alcohol use: Yes    Alcohol/week: 7.0 standard drinks of alcohol    Types: 7 Cans of beer per week    Comment: 1 beer daily, last drink evening of 12/28/21   Drug use: Not Currently   Sexual activity: Not on file  Other Topics Concern   Not on file  Social History Narrative   Not on file   Social Determinants of Health   Financial Resource Strain: Not on file  Food Insecurity: No Food Insecurity (03/20/2022)   Hunger Vital Sign    Worried About Running Out of Food in the Last Year: Never true    Ran  Out of Food in the Last Year: Never true  Transportation Needs: No Transportation Needs (03/20/2022)   PRAPARE - Hydrologist (Medical): No    Lack of Transportation (Non-Medical): No  Physical Activity: Not on file  Stress: Not on file  Social Connections: Not on file   History reviewed. No pertinent family history. Allergies  Allergen Reactions   Codeine    Poison Sumac Extract    Prior to Admission medications   Medication Sig Start Date End Date Taking? Authorizing Provider  diazepam (VALIUM) 10 MG tablet Take 10 mg by mouth 2 (two) times daily. 12/09/21  Yes [provider]  Esomeprazole Magnesium (NEXIUM PO) Take 1 tablet by mouth daily as needed (indigestion).   Yes [provider]  omeprazole (PRILOSEC) 20 MG capsule Take 20 mg by mouth daily as needed (indigestion). Alternates with nexium   Yes [provider]  Potassium (POTASSIMIN PO) Take 1 tablet by mouth in the morning and at bedtime.   Yes [provider]  chlordiazePOXIDE (LIBRIUM) 25 MG capsule Take 1 capsule (25 mg total) by mouth 2 (two) times daily. 03/27/22   Orson Eva, MD  diltiazem (CARDIZEM CD) 120 MG 24 hr capsule Take 1 capsule (120 mg total) by mouth daily. 03/27/22   Orson Eva, MD  HYDROcodone-acetaminophen (NORCO/VICODIN) 5-325 MG tablet Take 1-2 tablets by mouth every 4 (four) hours as needed for moderate pain. Patient not taking: Reported on 03/21/2022 02/01/22   Viona Gilmore D, NP  levETIRAcetam (KEPPRA) 500 MG tablet Take 1 tablet (500 mg total) by mouth 2 (two) times daily. 03/27/22   Orson Eva, MD  magnesium oxide (MAG-OX) 400 (240 Mg) MG tablet Take 1 tablet (400 mg total) by mouth 2 (two) times daily. 03/27/22   Orson Eva, MD  Metoprolol Tartrate 75 MG TABS Take 75 mg by mouth 2 (two) times daily. 03/27/22   Orson Eva, MD   MR FOREARM RIGHT WO CONTRAST  Result Date: 03/28/2022 CLINICAL DATA:  Deep forearm soft tissue mass. Cellulitis. Possible abscess. EXAM: MRI OF THE RIGHT FOREARM WITHOUT CONTRAST TECHNIQUE: Multiplanar, multisequence MR imaging of the proximal right forearm was performed. No intravenous contrast was administered. COMPARISON:  right humerus radiographs 01/24/2022 FINDINGS: Bones/Joint/Cartilage Moderate radial head-radial notch of the ulna joint space narrowing and peripheral osteophytosis. Focal edema within the medial aspect of the radial head at this articulation. Mild-to-moderate peripheral trochlea and coronoid process degenerative spurring. No acute fracture or dislocation. No cortical erosion. Ligaments No gross  ulnar collateral ligament or radial collateral ligaments of the elbow tear is seen. Muscles and Tendons The biceps brachii, brachialis, and triceps tendon insertions are intact at the elbow. There is mild edema within the volar aspect of the flexor carpi radialis muscle (axial series 4 images 16 through 19). Soft tissues A marker overlies the proximal forearm volar soft tissues at the level of the proximal radial and ulnar diaphyses. There is moderate edema and swelling of the subcutaneous fat deep to this region, surrounding the underlying basilic vein (axial series 4 images 7 through 29). Additional swelling and mild-to-moderate fluid at the deep aspect of the subcutaneous fat of the posterior forearm, starting at the level of the olecranon where the fluid measures up to 1.1 cm in AP dimension and approximately 6.4 cm in transverse dimension. This fluid tapers distally, but is seen within the subcutaneous fat throughout the majority of the forearm, measuring at least 15 cm in craniocaudal dimension. There is  moderate edema and swelling of the additional medial forearm subcutaneous fat. No elbow joint effusion. IMPRESSION: 1. In the area of interest, there is moderate edema and swelling of the volar subcutaneous fat of the proximal forearm, surrounding the underlying basilic vein. Given the of inflammation around the basilic vein, if there is clinical concern for a thrombus in the basilic vein (a superficial vein), consider right upper extremity venous doppler ultrasound. 2. Additional swelling and mild-to-moderate fluid at the deep aspect of the subcutaneous fat of the posterior forearm, starting at the level of the olecranon where the fluid is greatest, and tapering distally throughout the majority of the posterior forearm. No definitive walled-off abscess is seen, however MRI cannot determine whether this fluid is bland or infected. Electronically Signed   By: Yvonne Kendall M.D.   On: 03/28/2022 16:23      Family History Reviewed and non-contributory, no pertinent history of problems with bleeding or anesthesia    Review of Systems No fevers or chills No numbness or tingling No chest pain No shortness of breath No bowel or bladder dysfunction No GI distress No headaches    OBJECTIVE  Vitals:Patient Vitals for the past 8 hrs:  BP Temp Temp src Pulse Resp SpO2 Height Weight  03/29/22 0700 117/66 -- -- (!) 102 17 96 % -- --  03/29/22 0600 113/68 -- -- 93 18 98 % -- --  03/29/22 0500 (!) 90/56 -- -- 86 19 95 % -- --  03/29/22 0441 -- 98.4 F (36.9 C) Axillary -- -- -- 5' 9"  (1.753 m) 67.3 kg  03/29/22 0400 (!) 107/56 -- -- -- 19 -- -- --  03/29/22 0200 (!) 106/55 -- -- 73 18 100 % -- --  03/29/22 0100 (!) 89/55 -- -- 75 18 96 % -- --   General: Alert, no acute distress.  Sleepy, but is able to wake up.  He does answer questions when asked.  Little information is provided.  Family at the bedside. Cardiovascular: Warm extremities noted Respiratory: No cyanosis, no use of accessory musculature GI: No organomegaly, abdomen is soft and non-tender Skin: No lesions in the area of chief complaint other than those listed below in MSK exam.  Neurologic: Sensation intact distally save for the below mentioned MSK exam Psychiatric: Patient is competent for consent with normal mood and affect Lymphatic: No swelling obvious and reported other than the area involved in the exam below Extremities  RUE: Right volar forearm is red.  There is an area of induration.  This induration is just proximal to a prior IV site.  The IV site itself is clean and dry.  Forearm compartments are compressible.  2+ radial pulse.  Sensation is intact in the superficial radial, median and ulnar nerve distribution.  Active motion intact in the AIN/PIN/U nerve distribution.  Redness is within the boundaries outlined.  He is able to extend his elbow.  No point tenderness.  He states it hurts all over.       Test  Results Imaging  Right forearm MRI  IMPRESSION: 1. In the area of interest, there is moderate edema and swelling of the volar subcutaneous fat of the proximal forearm, surrounding the underlying basilic vein. Given the of inflammation around the basilic vein, if there is clinical concern for a thrombus in the basilic vein (a superficial vein), consider right upper extremity venous doppler ultrasound. 2. Additional swelling and mild-to-moderate fluid at the deep aspect of the subcutaneous fat of the posterior forearm, starting at  the level of the olecranon where the fluid is greatest, and tapering distally throughout the majority of the posterior forearm. No definitive walled-off abscess is seen, however MRI cannot determine whether this fluid is bland or infected.  Labs cbc Recent Labs    03/28/22 0428 03/29/22 0655  WBC 6.3 3.1*  HGB 10.7* 11.2*  HCT 31.8* 33.7*  PLT 314 329      Recent Labs    03/28/22 0428 03/29/22 0655  NA 133* 133*  K 3.7 3.3*  CL 99 100  CO2 22 22  GLUCOSE 110* 109*  BUN 5* 7  CREATININE 0.58* 0.67  CALCIUM 8.7* 8.4*

## 2022-03-29 NOTE — Progress Notes (Signed)
PROGRESS NOTE  KIERAN ARREGUIN YQM:[id number] DOB: 09-08-1960 DOA: 03/20/2022 PCP: Sharilyn Sites, MD  Brief History:  61 y.o. male with medical history significant of acid reflux, depression, hypertension, alcohol dependence, subdural hematoma and ,left parietal extra-axial hemorrhage and hemorrhagic contusions in left frontal and temporal lobe in Aug 2023 presents the ED with a chief complaint of seizure.  Patient gives history that he has given to ED staff.  To me, patient reports he came into the ED because he was lightheaded and hungry.  He reports that he is lightheaded probably because he was hungry.  He has had diarrhea in the interval.  His diarrhea is nonbloody and happens 3 times a day.  He has no associated abdominal pain.  He blames some [place] takeout food for his diarrhea.  He reports that the lightheadedness happen when he woke up.  He reports that it was present before he had any postural changes.  He reports that his hands were shaky because he had not eaten.  He reports no loss of consciousness, no loss of time, no falls.  Patient reports that he used to have a history of alcohol dependence but does not anymore.  He reports that his last drink was yesterday.  It was 1 beer.  When asked him that she normally drinks he will not give an answer.  He does report that he only drinks on days when he does not have to work.  When it is brought to his attention that he is has not had to work in several weeks because of his recent trauma, he reports that he has not been drinking has been in the hospital.   In the ED he reported that he had woken up this morning feeling tingly and having intermittent numbness to his left arm.  He reports he woke up with the symptoms today.  Did not get any better throughout the day.  EMS was called and in route to the hospital patient had witnessed tonic-clonic activity which lasted for about a minute.  2-1/2 mg of Versed was given which aborted the  tonic-clonic activity.  Patient was postictal on arrival to the hospital.  Patient does not remember any of this.   Patient does drink and does smoke as discussed above.  He does not use illicit drugs.  He is vaccinated for COVID.  Patient is full code.  Neurology was consulted and patient was started on Keppra.  He did not have any further seizures.  Unfortunately, the patient began developing alcohol withdrawal manifested by increasing agitation.  He was transferred to the ICU and placed on Precedex in the early morning 03/23/2022.  On the evening 10/19-10/20, patient developed fever and SVT.  This prolonged his hospitalization.  He was started on metoprolol and electrolytes were optimized. Metoprolol was gradually titrated up to optimize HR.  He remained hemodynamically stable.  During the hospitalization, right forearm noted to develop erythema and edema.  Pt was started on cefazolin.  It did not improve much so MR of forearm was ordered and pt was switched to vancomycin.  This has resulted in prolonged hospitalization.     Assessment and Plan: * Seizure ([place]) - secondary to intracranial bleed, SDH, hemorrhagic brain contusions -severe hypomagnesemia may have contributed - Neuro consulted and recommending keppra 500 mg BID  - Continue CIWA protocol for alcohol withdrawal  -Continue to monitor and replete magnesium - Seizure precautions - Continue to monitor -no  further seizures during hospitalization  Delirium tremens ([place]) - Family reports that patient has been a heavy drinker - Patient will not quantify how much he drinks -  his last drink was 10/14 and he had 1 beer per his report - He reports he only drinks on days that he is not working, but he has been out of work due to his recent trauma with subdural hematoma -remained agitated on librium and CIWA -03/23/22 AM--transferred to ICU and started on Precedex -03/24/22 AM--weaned off Precedex -10/19-repeat CT brain--pt complains  of ?diplopia>>>Unchanged trace residual subdural hematoma along the left parietal convexity Overall improved;  A&O x 3 but has some hospital delirium  SVT (supraventricular tachycardia) 10/19-10/20--pt developed paroxysms of SVT -personally reviewed tele>>possible atrial tach/MAT -started metoprolol>>increase to 75 mg bid -started cardizem CD 120 mg daily -echo EF 55-60%, no WMA, normal RVF -10/16 TSH 0.818  Cellulitis of arm ?site of previous IV>>infiltration -worsen erythema on cefazolin -MR right forearm>>swelling and mild-to-moderate fluid at the deep aspect of the subcutaneous fat of the posterior forearm; no walled off abscess -start IV vanc>>now improving -no sign of compartment syndrome -appreciate Dr. Amedeo Kinsman consult  Fever Developed fever 10/19 evening -UA--no pyuria -personally reviewed CXR--no infiltrates -blood culture neg to date -lactic 0.8 -PCT <0.10 -resolved  Diarrhea - 2 days of nonbloody diarrhea reported PTA - contributed to to electrolyte abnormalities - The patient continues to have diarrhea here we will get stool studies - No abdominal pain presently - resolved.  Tolerating diet  Hyperkalemia Given lokelma Remove KCl from IVF -repeat BMP>>improved  Wrist pain - Patient reports right wrist is painful - Slightly swollen compared to left wrist - Patient does not remember injuring it, so the possibility that it was her during seizure activity - X-ray with no acute fractures found, wrist splint ordered for comfort.   Tobacco use disorder - Patient reports he smokes 2 or 3 cigarettes a day - nicotine patch ordered  - Counseled on importance of cessation  GERD (gastroesophageal reflux disease) - Continue PPI  Hypocalcemia - Calcium 6.1 upon presentation -Corrects to 6.6 for albumin - Replaced with 1 g of calcium gluconate and tums oral TID - am CMP  Hypomagnesemia - admission Magnesium is undetectable at less than 0.5 -Secondary to poor  p.o. intake and GI losses - repleting IV again - continiue daily po mag ox  Hypokalemia - Potassium is 2.6 upon presentation - Secondary to poor p.o. intake and GI losses - continue to replace, recheck in AM    Family Communication:   sister in law updated 10/24   Consultants:  none   Code Status:  FULL    DVT Prophylaxis:  SCDs     Procedures: As Listed in Progress Note Above   Antibiotics: Cefazolin 10/22>10/23 Vanc 10/23>>           Subjective: Pt states right arm is feeling better.  Denies f/c, cp, sob, n/v/d, abd pain  Objective: Vitals:   03/29/22 1111 03/29/22 1130 03/29/22 1152 03/29/22 1608  BP: 109/67  116/78 96/67  Pulse: 89  95 100  Resp: 15   20  Temp:  99.6 F (37.6 C) 97.9 F (36.6 C) 99.5 F (37.5 C)  TempSrc:  Oral  Oral  SpO2: 100%  100% 98%  Weight:      Height:        Intake/Output Summary (Last 24 hours) at 03/29/2022 1831 Last data filed at 03/29/2022 1300 Gross per 24 hour  Intake 560 ml  Output 600 ml  Net -40 ml   Weight change:  Exam:  General:  Pt is alert, follows commands appropriately, not in acute distress HEENT: No icterus, No thrush, No neck mass, [place]/AT Cardiovascular: RRR, S1/S2, no rubs, no gallops Respiratory: fine bibasilar crackles.  No wheeze Abdomen: Soft/+BS, non tender, non distended, no guarding Extremities: see pic of right arm today RIGHT ARM>>>       Data Reviewed: I have personally reviewed following labs and imaging studies Basic Metabolic Panel: Recent Labs  Lab 03/25/22 0330 03/25/22 1352 03/26/22 0416 03/27/22 0343 03/28/22 0428 03/29/22 0655  NA 134* 134* 137 137 133* 133*  K 5.6* 4.5 3.8 3.6 3.7 3.3*  CL 107 101 106 106 99 100  CO2 21* 25 21* 21* 22 22  GLUCOSE 110* 115* 110* 104* 110* 109*  BUN 6 7 7 6  5* 7  CREATININE 0.69 0.78 0.57* 0.64 0.58* 0.67  CALCIUM 8.6* 9.0 9.1 8.5* 8.7* 8.4*  MG 2.1  --  1.6* 1.8 1.5* 1.9   Liver Function Tests: Recent Labs  Lab  03/23/22 0916 03/25/22 0330  AST 17 23  ALT 13 16  ALKPHOS 92 94  BILITOT 1.0 1.0  PROT 6.7 7.0  ALBUMIN 3.1* 2.9*   No results for input(s): "LIPASE", "AMYLASE" in the last 168 hours. No results for input(s): "AMMONIA" in the last 168 hours. Coagulation Profile: No results for input(s): "INR", "PROTIME" in the last 168 hours. CBC: Recent Labs  Lab 03/24/22 0310 03/25/22 0330 03/28/22 0428 03/29/22 0655  WBC 4.9 6.4 6.3 3.1*  HGB 11.7* 12.2* 10.7* 11.2*  HCT 34.7* 36.0* 31.8* 33.7*  MCV 101.5* 100.6* 99.4 97.7  PLT 245 295 314 329   Cardiac Enzymes: Recent Labs  Lab 03/25/22 0330  CKTOTAL 118   BNP: Invalid input(s): "POCBNP" CBG: No results for input(s): "GLUCAP" in the last 168 hours. HbA1C: No results for input(s): "HGBA1C" in the last 72 hours. Urine analysis:    Component Value Date/Time   COLORURINE YELLOW 03/24/2022 1718   APPEARANCEUR CLEAR 03/24/2022 1718   LABSPEC 1.013 03/24/2022 1718   PHURINE 9.0 (H) 03/24/2022 1718   GLUCOSEU 50 (A) 03/24/2022 1718   HGBUR NEGATIVE 03/24/2022 1718   BILIRUBINUR NEGATIVE 03/24/2022 1718   KETONESUR NEGATIVE 03/24/2022 1718   PROTEINUR NEGATIVE 03/24/2022 1718   NITRITE NEGATIVE 03/24/2022 1718   LEUKOCYTESUR NEGATIVE 03/24/2022 1718   Sepsis Labs: @LABRCNTIP (procalcitonin:4,lacticidven:4) ) Recent Results (from the past 240 hour(s))  MRSA Next Gen by PCR, Nasal     Status: None   Collection Time: 03/22/22 11:40 PM   Specimen: Nasal Mucosa; Nasal Swab  Result Value Ref Range Status   MRSA by PCR Next Gen NOT DETECTED NOT DETECTED Final    Comment: (NOTE) The GeneXpert MRSA Assay (FDA approved for NASAL specimens only), is one component of a comprehensive MRSA colonization surveillance program. It is not intended to diagnose MRSA infection nor to guide or monitor treatment for MRSA infections. Test performance is not FDA approved in patients less than 59 years old. Performed at [facility], [address] [place], [place] [address]   Urine Culture     Status: Abnormal   Collection Time: 03/24/22  5:18 PM   Specimen: Urine, Clean Catch  Result Value Ref Range Status   Specimen Description   Final    URINE, CLEAN CATCH Performed at [facility], [address] [place], [place] [address]    Special Requests   Final  NONE Performed at [facility], [address], [place], [place] [address]    Culture (A)  Final    <10,000 COLONIES/mL INSIGNIFICANT GROWTH Performed at [facility], [address] [place], [place] [address]    Report Status 03/25/2022 FINAL  Final  Culture, blood (Routine X 2) w Reflex to ID Panel     Status: None   Collection Time: 03/24/22  5:52 PM   Specimen: BLOOD  Result Value Ref Range Status   Specimen Description BLOOD LEFT ANTECUBITAL  Final   Special Requests   Final    BOTTLES DRAWN AEROBIC AND ANAEROBIC Blood Culture adequate volume   Culture   Final    NO GROWTH 5 DAYS Performed at [facility], [address], [place], [place] [address]    Report Status 03/29/2022 FINAL  Final  Culture, blood (Routine X 2) w Reflex to ID Panel     Status: None   Collection Time: 03/24/22  5:52 PM   Specimen: BLOOD  Result Value Ref Range Status   Specimen Description BLOOD LEFT WRIST  Final   Special Requests   Final    BOTTLES DRAWN AEROBIC AND ANAEROBIC Blood Culture results may not be optimal due to an excessive volume of blood received in culture bottles   Culture   Final    NO GROWTH 5 DAYS Performed at [facility], [address] [place], [place] [address]    Report Status 03/29/2022 FINAL  Final     Scheduled Meds:  calcium carbonate  800 mg of elemental calcium Oral TID WC   chlordiazePOXIDE  25 mg Oral BID   Chlorhexidine Gluconate Cloth  6 each Topical Daily   diltiazem  120 mg Oral Daily   folic acid  1 mg Oral Daily   levETIRAcetam  500 mg Oral BID   magnesium oxide  400 mg Oral BID   metoprolol tartrate  75 mg Oral BID    multivitamin with minerals  1 tablet Oral Once   nicotine  21 mg Transdermal Daily   pantoprazole  40 mg Oral Daily   potassium chloride  40 mEq Oral Once   thiamine  100 mg Oral Daily   Or   thiamine  100 mg Intravenous Daily   Continuous Infusions:  dexmedetomidine (PRECEDEX) IV infusion Stopped (03/24/22 0930)   vancomycin Stopped (03/29/22 0846)    Procedures/Studies: MR FOREARM RIGHT WO CONTRAST  Result Date: 03/28/2022 CLINICAL DATA:  Deep forearm soft tissue mass. Cellulitis. Possible abscess. EXAM: MRI OF THE RIGHT FOREARM WITHOUT CONTRAST TECHNIQUE: Multiplanar, multisequence MR imaging of the proximal right forearm was performed. No intravenous contrast was administered. COMPARISON:  right humerus radiographs 01/24/2022 FINDINGS: Bones/Joint/Cartilage Moderate radial head-radial notch of the ulna joint space narrowing and peripheral osteophytosis. Focal edema within the medial aspect of the radial head at this articulation. Mild-to-moderate peripheral trochlea and coronoid process degenerative spurring. No acute fracture or dislocation. No cortical erosion. Ligaments No gross ulnar collateral ligament or radial collateral ligaments of the elbow tear is seen. Muscles and Tendons The biceps brachii, brachialis, and triceps tendon insertions are intact at the elbow. There is mild edema within the volar aspect of the flexor carpi radialis muscle (axial series 4 images 16 through 19). Soft tissues A marker overlies the proximal forearm volar soft tissues at the level of the proximal radial and ulnar diaphyses. There is moderate edema and swelling of the subcutaneous fat deep to this region, surrounding the underlying basilic vein (axial series  4 images 7 through 29). Additional swelling and mild-to-moderate fluid at the deep aspect of the subcutaneous fat of the posterior forearm, starting at the level of the olecranon where the fluid measures up to 1.1 cm in AP dimension and approximately 6.4  cm in transverse dimension. This fluid tapers distally, but is seen within the subcutaneous fat throughout the majority of the forearm, measuring at least 15 cm in craniocaudal dimension. There is moderate edema and swelling of the additional medial forearm subcutaneous fat. No elbow joint effusion. IMPRESSION: 1. In the area of interest, there is moderate edema and swelling of the volar subcutaneous fat of the proximal forearm, surrounding the underlying basilic vein. Given the of inflammation around the basilic vein, if there is clinical concern for a thrombus in the basilic vein (a superficial vein), consider right upper extremity venous doppler ultrasound. 2. Additional swelling and mild-to-moderate fluid at the deep aspect of the subcutaneous fat of the posterior forearm, starting at the level of the olecranon where the fluid is greatest, and tapering distally throughout the majority of the posterior forearm. No definitive walled-off abscess is seen, however MRI cannot determine whether this fluid is bland or infected. Electronically Signed   By: Neita Garnet M.D.   On: 03/28/2022 16:23   ECHOCARDIOGRAM COMPLETE  Result Date: 03/25/2022    ECHOCARDIOGRAM REPORT   Patient Name:   BERTEL VENARD [facility] Date of Exam: 03/25/2022 Medical Rec #:  [id number]    Height:       69.0 in Accession #:    [id number]   Weight:       144.2 lb Date of Birth:  November 20, 1960   BSA:          1.798 m Patient Age:    61 years     BP:           154/96 mmHg Patient Gender: M            HR:           114 bpm. [facility] Procedure: 2D Echo, Cardiac Doppler and Color Doppler Indications:    SVT  History:        Patient has no prior history of Echocardiogram examinations.                 Arrythmias:Tachycardia; Risk Factors:Hypertension and Current                 Smoker.  Sonographer:    Mikki Harbor Referring Phys: [phone] Juno Bozard IMPRESSIONS  1. Left ventricular ejection fraction, by estimation, is 55 to 60%. The left  ventricle has normal function. The left ventricle has no regional wall motion abnormalities. Left ventricular diastolic parameters were normal.  2. Right ventricular systolic function is normal. The right ventricular size is normal. There is normal pulmonary artery systolic pressure.  3. The mitral valve is grossly normal. No evidence of mitral valve regurgitation. No evidence of mitral stenosis.  4. The aortic valve is normal in structure. Aortic valve regurgitation is not visualized. No aortic stenosis is present.  5. The inferior vena cava is normal in size with greater than 50% respiratory variability, suggesting right atrial pressure of 3 mmHg. FINDINGS  Left Ventricle: Left ventricular ejection fraction, by estimation, is 55 to 60%. The left ventricle has normal function. The left ventricle has no regional wall motion abnormalities. The left ventricular internal cavity size was normal in size. There is  no left ventricular hypertrophy. Left ventricular diastolic  parameters were normal. Right Ventricle: The right ventricular size is normal. No increase in right ventricular wall thickness. Right ventricular systolic function is normal. There is normal pulmonary artery systolic pressure. The tricuspid regurgitant velocity is 2.62 m/s, and  with an assumed right atrial pressure of 3 mmHg, the estimated right ventricular systolic pressure is 30.5 mmHg. Left Atrium: Left atrial size was normal in size. Right Atrium: Right atrial size was normal in size. Pericardium: Trivial pericardial effusion is present. Mitral Valve: The mitral valve is grossly normal. No evidence of mitral valve regurgitation. No evidence of mitral valve stenosis. MV peak gradient, 2.0 mmHg. The mean mitral valve gradient is 1.0 mmHg. Tricuspid Valve: The tricuspid valve is grossly normal. Tricuspid valve regurgitation is mild . No evidence of tricuspid stenosis. Aortic Valve: The aortic valve is normal in structure. Aortic valve regurgitation  is not visualized. No aortic stenosis is present. Aortic valve mean gradient measures 2.0 mmHg. Aortic valve peak gradient measures 4.4 mmHg. Aortic valve area, by VTI measures 2.70 cm. Pulmonic Valve: The pulmonic valve was grossly normal. Pulmonic valve regurgitation is trivial. No evidence of pulmonic stenosis. Aorta: The aortic root is normal in size and structure. Venous: The inferior vena cava is normal in size with greater than 50% respiratory variability, suggesting right atrial pressure of 3 mmHg. IAS/Shunts: No atrial level shunt detected by color flow Doppler.  LEFT VENTRICLE PLAX 2D LVIDd:         4.00 cm   Diastology LVIDs:         2.90 cm   LV e' medial:    9.03 cm/s LV PW:         1.20 cm   LV E/e' medial:  5.7 LV IVS:        1.20 cm   LV e' lateral:   8.05 cm/s LVOT diam:     2.10 cm   LV E/e' lateral: 6.4 LV SV:         43 LV SV Index:   24 LVOT Area:     3.46 cm  RIGHT VENTRICLE RV Basal diam:  3.20 cm RV Mid diam:    2.20 cm RV S prime:     16.90 cm/s TAPSE (M-mode): 2.4 cm LEFT ATRIUM             Index        RIGHT ATRIUM           Index LA diam:        2.80 cm 1.56 cm/m   RA Area:     14.90 cm LA Vol (A2C):   34.0 ml 18.91 ml/m  RA Volume:   40.00 ml  22.25 ml/m LA Vol (A4C):   20.6 ml 11.46 ml/m LA Biplane Vol: 27.4 ml 15.24 ml/m  AORTIC VALVE                    PULMONIC VALVE AV Area (Vmax):    2.89 cm     PV Vmax:       1.29 m/s AV Area (Vmean):   2.82 cm     PV Peak grad:  6.7 mmHg AV Area (VTI):     2.70 cm AV Vmax:           105.00 cm/s AV Vmean:          70.900 cm/s AV VTI:            0.158 m AV Peak Grad:  4.4 mmHg AV Mean Grad:      2.0 mmHg LVOT Vmax:         87.50 cm/s LVOT Vmean:        57.700 cm/s LVOT VTI:          0.123 m LVOT/AV VTI ratio: 0.78  AORTA Ao Root diam: 3.30 cm MITRAL VALVE               TRICUSPID VALVE MV Area (PHT): 5.58 cm    TR Peak grad:   27.5 mmHg MV Area VTI:   3.46 cm    TR Vmax:        262.00 cm/s MV Peak grad:  2.0 mmHg MV Mean grad:  1.0  mmHg    SHUNTS MV Vmax:       0.71 m/s    Systemic VTI:  0.12 m MV Vmean:      43.3 cm/s   Systemic Diam: 2.10 cm MV Decel Time: 136 msec MV E velocity: 51.80 cm/s MV A velocity: 65.50 cm/s MV E/A ratio:  0.79 Vishnu Priya Mallipeddi Electronically signed by Winfield Rast Mallipeddi Signature Date/Time: 03/25/2022/1:55:03 PM    Final    DG CHEST PORT 1 VIEW  Result Date: 03/24/2022 CLINICAL DATA:  Fever and cough. EXAM: PORTABLE CHEST 1 VIEW COMPARISON:  Chest radiograph dated 01/24/2022 and CT dated 01/25/2019. FINDINGS: Mild centrilobular emphysema. No focal consolidation, pleural effusion, or pneumothorax. The cardiac silhouette is within normal limits. No acute osseous pathology. IMPRESSION: No active disease. Electronically Signed   By: Elgie Collard M.D.   On: 03/24/2022 18:52   CT HEAD WO CONTRAST ( )  Result Date: 03/24/2022 CLINICAL DATA:  Recent subdural hematoma.  Double vision. EXAM: CT HEAD WITHOUT CONTRAST TECHNIQUE: Contiguous axial images were obtained from the base of the skull through the vertex without intravenous contrast. RADIATION DOSE REDUCTION: This exam was performed according to the departmental dose-optimization program which includes automated exposure control, adjustment of the mA and/or kV according to patient size and/or use of iterative reconstruction technique. COMPARISON:  Brain MRI from 3 days ago FINDINGS: Brain: Left inferior and anterior temporal encephalomalacia, there is history of cerebral contusions. Motion artifact especially affecting the surface of the brain but still visible and stable subdural hematoma along the left parietal convexity measuring 3 mm in thickness, see 4: 54. No hydrocephalus or masslike finding Vascular: No hyperdense vessel or unexpected calcification. Skull: Normal. Negative for fracture or focal lesion. Sinuses/Orbits: No acute finding. IMPRESSION: 1. Unchanged trace residual subdural hematoma along the left parietal convexity, up to 3  mm in thickness. 2. Sequela of anterior left frontal and temporal contusions. Electronically Signed   By: Tiburcio Pea M.D.   On: 03/24/2022 13:20   MR CERVICAL SPINE WO CONTRAST  Result Date: 03/21/2022 CLINICAL DATA:  Paresthesias, dizziness, cervical radiculopathy EXAM: MRI CERVICAL AND THORACIC SPINE WITHOUT CONTRAST TECHNIQUE: Multiplanar and multiecho pulse sequences of the cervical spine, to include the craniocervical junction and cervicothoracic junction, and the thoracic spine, were obtained without intravenous contrast. COMPARISON:  No prior MRI of the cervical or thoracic spine, correlation is made with 01/24/2022 CT cervical spine FINDINGS: MRI CERVICAL SPINE FINDINGS Evaluation is somewhat limited by motion artifact. Alignment: No listhesis. Vertebrae: No fracture, evidence of discitis, or bone lesion. Cord: Normal signal and morphology. Posterior Fossa, vertebral arteries, paraspinal tissues: Negative. Disc levels: C2-C3: No significant disc bulge. No spinal canal stenosis or neuroforaminal narrowing. C3-C4: No significant disc bulge. No spinal canal stenosis or neuroforaminal  narrowing. C4-C5: Disc height loss and mild disc bulge. Facet and uncovertebral hypertrophy, right-greater-than-left. No spinal canal stenosis. Mild-to-moderate right neural foraminal narrowing. C5-C6: Disc height loss and mild disc bulge. Facet and uncovertebral hypertrophy. No spinal canal stenosis. Mild bilateral neural foraminal narrowing. C6-C7: Disc height loss and mild disc bulge with disc osteophyte complex. Facet and uncovertebral hypertrophy. No spinal canal stenosis. Mild bilateral neural foraminal narrowing. C7-T1: No significant disc bulge. No spinal canal stenosis or neuroforaminal narrowing. MRI THORACIC SPINE FINDINGS Alignment: No listhesis. Preservation of the normal thoracic kyphosis. Vertebrae: No fracture, evidence of discitis, or bone lesion. Cord:  Normal signal and morphology. Paraspinal and other  soft tissues: Trace pleural effusions. Disc levels: No significant spinal canal stenosis or neural foraminal narrowing. IMPRESSION: 1. C4-C5 mild-to-moderate right neural foraminal narrowing. 2. C5-C6 and C6-C7 mild bilateral neural foraminal narrowing. 3. No spinal canal stenosis in the cervical spine. 4. No significant spinal canal stenosis or neural foraminal narrowing in the thoracic spine. 5. Trace pleural effusions. Electronically Signed   By: Wiliam Ke M.D.   On: 03/21/2022 18:44   MR THORACIC SPINE WO CONTRAST  Result Date: 03/21/2022 CLINICAL DATA:  Paresthesias, dizziness, cervical radiculopathy EXAM: MRI CERVICAL AND THORACIC SPINE WITHOUT CONTRAST TECHNIQUE: Multiplanar and multiecho pulse sequences of the cervical spine, to include the craniocervical junction and cervicothoracic junction, and the thoracic spine, were obtained without intravenous contrast. COMPARISON:  No prior MRI of the cervical or thoracic spine, correlation is made with 01/24/2022 CT cervical spine FINDINGS: MRI CERVICAL SPINE FINDINGS Evaluation is somewhat limited by motion artifact. Alignment: No listhesis. Vertebrae: No fracture, evidence of discitis, or bone lesion. Cord: Normal signal and morphology. Posterior Fossa, vertebral arteries, paraspinal tissues: Negative. Disc levels: C2-C3: No significant disc bulge. No spinal canal stenosis or neuroforaminal narrowing. C3-C4: No significant disc bulge. No spinal canal stenosis or neuroforaminal narrowing. C4-C5: Disc height loss and mild disc bulge. Facet and uncovertebral hypertrophy, right-greater-than-left. No spinal canal stenosis. Mild-to-moderate right neural foraminal narrowing. C5-C6: Disc height loss and mild disc bulge. Facet and uncovertebral hypertrophy. No spinal canal stenosis. Mild bilateral neural foraminal narrowing. C6-C7: Disc height loss and mild disc bulge with disc osteophyte complex. Facet and uncovertebral hypertrophy. No spinal canal stenosis. Mild  bilateral neural foraminal narrowing. C7-T1: No significant disc bulge. No spinal canal stenosis or neuroforaminal narrowing. MRI THORACIC SPINE FINDINGS Alignment: No listhesis. Preservation of the normal thoracic kyphosis. Vertebrae: No fracture, evidence of discitis, or bone lesion. Cord:  Normal signal and morphology. Paraspinal and other soft tissues: Trace pleural effusions. Disc levels: No significant spinal canal stenosis or neural foraminal narrowing. IMPRESSION: 1. C4-C5 mild-to-moderate right neural foraminal narrowing. 2. C5-C6 and C6-C7 mild bilateral neural foraminal narrowing. 3. No spinal canal stenosis in the cervical spine. 4. No significant spinal canal stenosis or neural foraminal narrowing in the thoracic spine. 5. Trace pleural effusions. Electronically Signed   By: Wiliam Ke M.D.   On: 03/21/2022 18:44   MR BRAIN WO CONTRAST  Result Date: 03/21/2022 CLINICAL DATA:  Paresthesias, dizziness, could be secondary to fall induced neuropathy or spinal stenosis. Concern for seizure. EXAM: MRI HEAD WITHOUT CONTRAST TECHNIQUE: Multiplanar, multiecho pulse sequences of the brain and surrounding structures were obtained without intravenous contrast. COMPARISON:  No prior MRI, correlation is made with head CT 03/20/2022 FINDINGS: Evaluation is somewhat limited by motion artifact. Brain: No restricted diffusion to suggest acute or subacute infarct. No acute hemorrhage, mass, mass effect, or midline shift. No hydrocephalus. 5 mm residual  left parietal subdural hematoma (series 18, image 7), as seen on the recent CT. No new extra-axial collection. Remote right posterior lentiform nucleus lacunar infarct. Encephalomalacia in the left anteromedial temporal lobe (series 15, image 12) and left inferior frontal lobe (series 15, images 19 and 22), likely the sequela of prior trauma. These areas are associated with hemosiderin deposition. The hippocampi are symmetric in size and signal. No heterotopia or  evidence of cortical dysgenesis. Vascular: Normal arterial flow voids. Skull and upper cervical spine: Normal marrow signal. Sinuses/Orbits: Mucosal thickening in the maxillary sinuses and ethmoid air cells. The orbits are unremarkable. Other: Trace fluid in left mastoid air cells. IMPRESSION: 1. No acute intracranial process. No acute or subacute infarct. No seizure etiology identified. 2. 5 mm residual left parietal subdural hematoma, as seen on the recent CT. 3. Encephalomalacia in the left anteromedial temporal lobe and left inferior frontal lobe, likely the sequela of prior trauma. Electronically Signed   By: Wiliam Ke M.D.   On: 03/21/2022 18:20   DG Wrist 2 Views Right  Result Date: 03/20/2022 CLINICAL DATA:  Wrist pain. EXAM: RIGHT WRIST - 2 VIEW COMPARISON:  None Available. FINDINGS: There is soft tissue swelling surrounding the wrist. There is no acute fracture or dislocation. There is mild radiocarpal joint space narrowing. IMPRESSION: 1. No acute fracture or dislocation. 2. Soft tissue swelling surrounding the wrist. Electronically Signed   By: Darliss Cheney M.D.   On: 03/20/2022 23:04   CT HEAD WO CONTRAST  Result Date: 03/20/2022 CLINICAL DATA:  New onset seizure, recent trauma with subdural hematoma. EXAM: CT HEAD WITHOUT CONTRAST TECHNIQUE: Contiguous axial images were obtained from the base of the skull through the vertex without intravenous contrast. RADIATION DOSE REDUCTION: This exam was performed according to the departmental dose-optimization program which includes automated exposure control, adjustment of the mA and/or kV according to patient size and/or use of iterative reconstruction technique. COMPARISON:  01/25/2022 FINDINGS: Brain: No evidence of acute infarction, hydrocephalus, extra-axial collection or mass lesion/mass effect. Trace residual left parietal subdural hematoma (coronal image 52), measuring 5 mm in thickness, previously 19 mm. Encephalomalacic changes in the  left frontal lobe (series 2/image 11). Prior left frontal contusion with subarachnoid hemorrhage has resolved. Subcortical white matter and periventricular small vessel ischemic changes. Vascular: Intracranial atherosclerosis. Skull: Normal. Negative for fracture or focal lesion. Sinuses/Orbits: Minimal partial opacification of the bilateral maxillary sinuses. Mastoid air cells are clear. Other: None. IMPRESSION: Trace residual left parietal subdural hematoma, as above. Encephalomalacic changes in the left frontal lobe at the site of prior left frontal contusion. Small vessel ischemic changes. Electronically Signed   By: Charline Bills M.D.   On: 03/20/2022 18:13    Catarina Hartshorn, DO  [facility]  If 7PM-7AM, please contact night-coverage [email or web address] Password [email or web address] 03/29/2022, 6:31 PM   LOS: 9 days

## 2022-03-29 NOTE — Progress Notes (Signed)
Physical Therapy Note  Patient Details  Name: Eric Conley MRN: [id number] Date of Birth: 11-02-1960 Today's Date: 03/29/2022    PT refuses participation with therapy this evening.  No reason provided.  Teena Irani, PTA/CLT [place] Ph: [place], [place] 03/29/2022, 3:22 PM

## 2022-03-29 NOTE — Progress Notes (Signed)
Patient transferred from ICU in stable condition     03/29/22 1152  Vitals  Temp 97.9 F (36.6 C)  BP 116/78  MAP (mmHg) 90  BP Location Left Arm  BP Method Automatic  Patient Position (if appropriate) Lying  Pulse Rate 95  Pulse Rate Source Monitor  MEWS COLOR  MEWS Score Color Green  Oxygen Therapy  SpO2 100 %  O2 Device Room Air  MEWS Score  MEWS Temp 0  MEWS Systolic 0  MEWS Pulse 0  MEWS RR 0  MEWS LOC 0  MEWS Score 0

## 2022-03-30 ENCOUNTER — Inpatient Hospital Stay (HOSPITAL_COMMUNITY): Payer: 59

## 2022-03-30 DIAGNOSIS — F10939 Alcohol use, unspecified with withdrawal, unspecified: Secondary | ICD-10-CM | POA: Diagnosis not present

## 2022-03-30 DIAGNOSIS — L03113 Cellulitis of right upper limb: Secondary | ICD-10-CM | POA: Diagnosis not present

## 2022-03-30 DIAGNOSIS — F10931 Alcohol use, unspecified with withdrawal delirium: Secondary | ICD-10-CM | POA: Diagnosis not present

## 2022-03-30 DIAGNOSIS — R569 Unspecified convulsions: Secondary | ICD-10-CM | POA: Diagnosis not present

## 2022-03-30 LAB — BASIC METABOLIC PANEL
Anion gap: 9 (ref 5–15)
BUN: 10 mg/dL (ref 6–20)
CO2: 24 mmol/L (ref 22–32)
Calcium: 8.9 mg/dL (ref 8.9–10.3)
Chloride: 100 mmol/L (ref 98–111)
Creatinine, Ser: 0.78 mg/dL (ref 0.61–1.24)
GFR, Estimated: >60 mL/min (ref >60)
Glucose, Bld: 108 mg/dL — ABNORMAL HIGH (ref 70–99)
Potassium: 4 mmol/L (ref 3.5–5.1)
Sodium: 133 mmol/L — ABNORMAL LOW (ref 135–145)

## 2022-03-30 LAB — CBC
HCT: 33.4 % — ABNORMAL LOW (ref 39.0–52.0)
Hemoglobin: 11.1 g/dL — ABNORMAL LOW (ref 13.0–17.0)
MCH: 32.8 pg (ref 26.0–34.0)
MCHC: 33.2 g/dL (ref 30.0–36.0)
MCV: 98.8 fL (ref 80.0–100.0)
Platelets: 358 10*3/uL (ref 150–400)
RBC: 3.38 MIL/uL — ABNORMAL LOW (ref 4.22–5.81)
RDW: 16.1 % — ABNORMAL HIGH (ref 11.5–15.5)
WBC: 3.2 10*3/uL — ABNORMAL LOW (ref 4.0–10.5)
nRBC: 0 % (ref 0.0–0.2)

## 2022-03-30 LAB — MAGNESIUM: Magnesium: 1.8 mg/dL (ref 1.7–2.4)

## 2022-03-30 LAB — C-REACTIVE PROTEIN: CRP: 20.7 mg/dL — ABNORMAL HIGH (ref <1.0)

## 2022-03-30 MED ORDER — CALCIUM CARBONATE ANTACID 500 MG PO CHEW: 1.0000 | CHEWABLE_TABLET | Freq: Three times a day (TID) | ORAL | Status: DC | PRN

## 2022-03-30 MED ORDER — SACCHAROMYCES BOULARDII 250 MG PO CAPS
250.0000 mg | ORAL_CAPSULE | Freq: Two times a day (BID) | ORAL | Status: DC
  Administered 2022-03-30 – 2022-03-31 (×3): 250 mg via ORAL
  Filled 2022-03-30 (×3): qty 1

## 2022-03-30 MED ORDER — KETOROLAC TROMETHAMINE 30 MG/ML IJ SOLN
30.0000 mg | Freq: Four times a day (QID) | INTRAMUSCULAR | Status: AC
  Administered 2022-03-30 (×2): 30 mg via INTRAVENOUS
  Filled 2022-03-30 (×2): qty 1

## 2022-03-30 MED ORDER — CHLORDIAZEPOXIDE HCL 5 MG PO CAPS
10.0000 mg | ORAL_CAPSULE | Freq: Three times a day (TID) | ORAL | Status: DC
  Administered 2022-03-30 – 2022-03-31 (×3): 10 mg via ORAL
  Filled 2022-03-30 (×3): qty 2

## 2022-03-30 NOTE — Progress Notes (Signed)
   ORTHOPAEDIC PROGRESS NOTE  Scheduled for procedure(s): IRRIGATION AND DEBRIDEMENT EXTREMITY; right forearm abscess  DOS: 03/31/22  SUBJECTIVE: Patient states that his right forearm is still painful.  He has not noticed any improvements.  He does not think he has had any fevers recently.  OBJECTIVE: PE:  Sleepy, but easily aroused.  He is oriented.  Right forearm with diffuse swelling and redness.  There is some radiation to the lower aspect of the forearm, just distal to the antecubital fossa.  There is tenderness to palpation in this area.  No active drainage.  Fingers are warm and well-perfused.  He has intact sensation throughout.  Vitals:   03/30/22 0836 03/30/22 1336  BP: (!) 125/91 106/71  Pulse: (!) 106 88  Resp:  18  Temp:  99 F (37.2 C)  SpO2:  97%     ASSESSMENT: Eric Conley is a 61 y.o. male stable, with swelling, redness and induration of the right forearm, consistent with abscess, or evolving abscess.  At this point, it has not responded to IV antibiotics.  As result, I am recommending incision and drainage of the right volar forearm.  The procedure was discussed in great detail.  All questions were answered.  He is in agreement with this plan.  Plan to proceed with surgery tomorrow morning, please keep patient n.p.o. at midnight.   PLAN: Weightbearing: WBAT RUE Insicional and dressing care: Reinforce dressings as needed Orthopedic device(s): None VTE prophylaxis:  As determined by the hospitalist team   Pain control: As needed Follow - up plan: To be determined following the procedure.   Contact information:     Naveya Ellerman A. Amedeo Kinsman, MD [facility] [address] [place],  [place]  [address] Phone: [phone] Fax: [phone]

## 2022-03-30 NOTE — Progress Notes (Addendum)
Subjective:  Patient states that he is feeling okay but expresses concern for "lawyer bills" that were due last week. Per nursing note, patient had agitation/confusion and pain early this morning so 5 mg oxycodone was given. A few hours later, patient was found naked in bed with condom catheter on the floor. Patient scored 9 on CIWA scale at that time.  Objective:  Vital signs in last 24 hours: Vitals:   03/29/22 2114 03/29/22 2357 03/30/22 0519 03/30/22 0836  BP: 116/73 107/66 131/79 (!) 125/91  Pulse: (!) 105 90 97 (!) 106  Resp: 20 19 18    Temp: 98.9 F (37.2 C) 99 F (37.2 C) (!) 97.3 F (36.3 C)   TempSrc:      SpO2: 96% 97% 97%   Weight:      Height:       Weight change:   Intake/Output Summary (Last 24 hours) at 03/30/2022 1018 Last data filed at 03/30/2022 0900 Gross per 24 hour  Intake 840 ml  Output 750 ml  Net 90 ml    Physical Examination  General: Lying in bed in no acute distress Head: Normocephalic, atraumatic Cardiovascular: regular rate and rhythm, without murmurs, rubs, or gallops, no LEE Respiratory: Normal work of breathing, lungs CTAB Abdominal: normoactive bowel sounds, no tenderness to palpation Skin: warm, dry Extremities: right arm erythematous and edematous Neurocognitive: AAO x 3 Psychiatric: Flat affect    Assessment/Plan:  Principal Problem:   Seizure (HCC) Active Problems:   Diarrhea   Hypokalemia   Hypomagnesemia   Hypocalcemia   GERD (gastroesophageal reflux disease)   Tobacco use disorder   Wrist pain   Delirium tremens (HCC)   Alcohol withdrawal seizure with complication (HCC)   SVT (supraventricular tachycardia)   Hyperkalemia   Fever   Cellulitis of arm   Seizure (HCC) Likely secondary to intracranial bleed, SDH, hemorrhagic brain contusions and severe hypomagnesemia which may have also contributed. No further seizures since admission. Magnesium now normal after repletion. -Neuro consulted and patient started on  Keppra 500 mg BID  -Continue CIWA protocol for alcohol withdrawal (9 as of this morning) -Seizure precautions    Delirium tremens ([place]) Family reports that patient has been a heavy drinker but patient unable to quantify how much. Last reported drink was 1 beer on 10/14. Patient continues to have transient episodes of agitation but vitals are stable. -Continue Librium, Thiamine -Continue to monitor vitals    SVT (supraventricular tachycardia) 10/19-10/20--patient developed paroxysms of SVT. Patient's HR is stable and is asymptomatic. Echo showed EF 55-60%, no wall motion abnormalities, and normal RVF. TSH is normal  -Continue metoprolol and Cardizem  Possible Cellulitis of Right Arm Ortho consulted and states that presentation is most consistent with IV infiltration.  MRI right forearm demonstrated swelling and mild-to-moderate fluid at the deep aspect of the subcutaneous fat of the posterior forearm; no walled off abscess. There is no sign of compartment syndrome. Patient had one day of Cefazolin and is on day 3 of Vancomycin. Patient's vitals are stable and without leukocytosis. U/S right arm ordered to rule out thrombosis and is pending. -Continue Vancomycin -Follow-up with U/S results   Fever Resolved   Diarrhea Resolved   Hyperkalemia Resolved   Tobacco use disorder - Patient reports he smokes 2 or 3 cigarettes a day - nicotine patch ordered - Counseled on importance of cessation   GERD  - Continue PPI    Hypocalcemia Currently 8.9 (resolved).  -Tums modified from TID to PRN.  Hypomagnesemia Magnesium  was undetectable ( < 0.5) upon admission. Likely secondary to poor PO intake and GI losses. Levels have been normal for the past two days. -Magnesium oxide discontinued today  Hypokalemia Resolved   LOS: 10 days   Carmina Miller, Medical Student 03/30/2022, 10:18 AM

## 2022-03-30 NOTE — Progress Notes (Signed)
Pt has acted slightly agitated and confused. 5/10 pain reported, PRN 5 mg oxycodone given. Pt's vitals stable throughout night. Erythema and edema noted on RUE.

## 2022-03-30 NOTE — Progress Notes (Signed)
Patient was found naked in the bed with the condom cath in the floor. Patient is trying to get out of the bed and go pay his bills he states. Patient scored a 9 on the CIWA scale. Patient does not have any prn ativan ordered. MD Wynetta Emery notified.

## 2022-03-31 ENCOUNTER — Other Ambulatory Visit: Payer: Self-pay

## 2022-03-31 ENCOUNTER — Encounter (HOSPITAL_COMMUNITY)
Admission: EM | Disposition: A | Discharge status: Home Health | Payer: Self-pay | Source: Home / Self Care | Attending: Internal Medicine

## 2022-03-31 ENCOUNTER — Encounter (HOSPITAL_COMMUNITY): Payer: Self-pay | Admitting: Family Medicine

## 2022-03-31 DIAGNOSIS — I809 Phlebitis and thrombophlebitis of unspecified site: Secondary | ICD-10-CM | POA: Diagnosis present

## 2022-03-31 DIAGNOSIS — I808 Phlebitis and thrombophlebitis of other sites: Secondary | ICD-10-CM

## 2022-03-31 SURGERY — IRRIGATION AND DEBRIDEMENT EXTREMITY
Anesthesia: Choice | Laterality: Right

## 2022-03-31 MED ORDER — OMEPRAZOLE 20 MG PO CPDR: 20.0000 mg | DELAYED_RELEASE_CAPSULE | Freq: Every day | ORAL | 1 refills | Status: AC

## 2022-03-31 MED ORDER — VITAMIN B-1 100 MG PO TABS: 100.0000 mg | ORAL_TABLET | Freq: Every day | ORAL | 2 refills | Status: DC

## 2022-03-31 MED ORDER — ADULT MULTIVITAMIN W/MINERALS CH: 1.0000 | ORAL_TABLET | Freq: Once | ORAL | 0 refills | Status: AC

## 2022-03-31 MED ORDER — SACCHAROMYCES BOULARDII 250 MG PO CAPS: 250.0000 mg | ORAL_CAPSULE | Freq: Two times a day (BID) | ORAL | 0 refills | Status: AC

## 2022-03-31 MED ORDER — CHLORDIAZEPOXIDE HCL 5 MG PO CAPS: ORAL_CAPSULE | ORAL | 0 refills | Status: DC

## 2022-03-31 MED ORDER — DOXYCYCLINE HYCLATE 100 MG PO CAPS: 100.0000 mg | ORAL_CAPSULE | Freq: Two times a day (BID) | ORAL | 0 refills | Status: AC

## 2022-03-31 MED ORDER — CALCIUM CARBONATE ANTACID 500 MG PO CHEW: 1.0000 | CHEWABLE_TABLET | Freq: Three times a day (TID) | ORAL | Status: DC | PRN

## 2022-03-31 MED ORDER — FOLIC ACID 1 MG PO TABS: 1.0000 mg | ORAL_TABLET | Freq: Every day | ORAL | 2 refills | Status: DC

## 2022-03-31 NOTE — Progress Notes (Signed)
   ORTHOPAEDIC PROGRESS NOTE  SUBJECTIVE: Still has pain in the right forearm.  No fevers or chills. NPO since midnight.   OBJECTIVE: PE:  Sleepy, but easily aroused.  He is oriented.  Erythema with some improvement. Still an area of induration in the volar forearm  This is firm.  Some tenderness Sensation to the right hand is intact Fingers are warm and well perfused.   Vitals:   03/31/22 0600 03/31/22 0624  BP: (!) 122/99 116/75  Pulse: (!) 101 89  Resp: 19 10  Temp: 97.9 F (36.6 C) 98.2 F (36.8 C)  SpO2: 96% 96%     ASSESSMENT: JOSEDANIEL HAYE is a 61 y.o. male stable.  Ultrasound yesterday shows superficial thrombophlebitis in the volar forearm.  This is not an abscess.   Supportive treatment.  Warm compress.  Elevate the arm.  NSAIDs.   PLAN: Weightbearing: WBAT RUE Insicional and dressing care: warm compress, elevate the arm  Orthopedic device(s): None VTE prophylaxis:  As determined by the hospitalist team   Pain control: As needed Follow - up plan:  available as needed.  Consider a repeat MRI if still concerned.    Contact information:     Johnwilliam Shepperson A. Amedeo Kinsman, MD [facility] [address] [place],  [place]  [address] Phone: [phone] Fax: [phone]

## 2022-03-31 NOTE — Assessment & Plan Note (Signed)
--  discussed with orthopedics --supportive therapy with topical warm compresses, heat, NSAIDs, elevation recommended --MRI was done with no findings of abscess --outpatient follow up with Dr. Amedeo Kinsman recommended as needed  --treated presumed cellulitis with antibiotics

## 2022-03-31 NOTE — Discharge Summary (Signed)
Physician Discharge Summary  HUY MAJID VFI:[id number] DOB: January 10, 1961 DOA: 03/20/2022  PCP: Assunta Found, MD Ortho: Dr. Dallas Schimke Neurology: [facility]  Admit date: 03/20/2022 Discharge date: 03/31/2022  Admitted From:  Home  Disposition: Home with home health and 24/7 supervision  Recommendations for Outpatient Follow-up:  Follow up with PCP in 1 weeks Follow up with [facility] in 1 month  Follow up with Dr. Dallas Schimke orthopedics in 2 weeks    Neurology Recommendations Seizure precautions: Per [facility] statutes, patients with seizures are not allowed to drive until they have been seizure-free for six months and cleared by a physician    Use caution when using heavy equipment or power tools. Avoid working on ladders or at heights. Take showers instead of baths. Ensure the water temperature is not too high on the home water heater. Do not go swimming alone. Do not lock yourself in a room alone (i.e. bathroom). When caring for infants or small children, sit down when holding, feeding, or changing them to minimize risk of injury to the child in the event you have a seizure. Maintain good sleep hygiene. Avoid alcohol.    If patient has another seizure, call 911 and bring them back to the ED if: A.  The seizure lasts longer than 5 minutes.      B.  The patient doesn't wake shortly after the seizure or has new problems such as difficulty seeing, speaking or moving following the seizure C.  The patient was injured during the seizure D.  The patient has a temperature over 102 F (39C) E.  The patient vomited during the seizure and now is having trouble breathing    During the Seizure   - First, ensure adequate ventilation and place patients on the floor on their left side  Loosen clothing around the neck and ensure the airway is patent. If the patient is clenching the teeth, do not force the mouth open with any object as this can cause severe damage - Remove all  items from the surrounding that can be hazardous. The patient may be oblivious to what's happening and may not even know what he or she is doing. If the patient is confused and wandering, either gently guide him/her away and block access to outside areas - Reassure the individual and be comforting - Call 911. In most cases, the seizure ends before EMS arrives. However, there are cases when seizures may last over 3 to 5 minutes. Or the individual may have developed breathing difficulties or severe injuries. If a pregnant patient or a person with diabetes develops a seizure, it is prudent to call an ambulance. - Finally, if the patient does not regain full consciousness, then call EMS. Most patients will remain confused for about 45 to 90 minutes after a seizure, so you must use judgment in calling for help.      After the Seizure (Postictal Stage)   After a seizure, most patients experience confusion, fatigue, muscle pain and/or a headache. Thus, one should permit the individual to sleep. For the next few days, reassurance is essential. Being calm and helping reorient the person is also of importance.   Most seizures are painless and end spontaneously. Seizures are not harmful to others but can lead to complications such as stress on the lungs, brain and the heart. Individuals with prior lung problems may develop labored breathing and respiratory distress.   Home Health:  PT  Discharge Condition: STABLE   CODE STATUS: FULL DIET:  regular     Brief Hospitalization Summary: Please see all hospital notes, images, labs for full details of the hospitalization. Brief History:  61 y.o. male with medical history significant of acid reflux, depression, hypertension, alcohol dependence, subdural hematoma and ,left parietal extra-axial hemorrhage and hemorrhagic contusions in left frontal and temporal lobe in Aug 2023 presents the ED with a chief complaint of seizure.  Patient gives history that he has  given to ED staff.  To me, patient reports he came into the ED because he was lightheaded and hungry.  He reports that he is lightheaded probably because he was hungry.  He has had diarrhea in the interval.  His diarrhea is nonbloody and happens 3 times a day.  He has no associated abdominal pain.  He blames some [place] takeout food for his diarrhea.  He reports that the lightheadedness happen when he woke up.  He reports that it was present before he had any postural changes.  He reports that his hands were shaky because he had not eaten.  He reports no loss of consciousness, no loss of time, no falls.  Patient reports that he used to have a history of alcohol dependence but does not anymore.  He reports that his last drink was yesterday.  It was 1 beer.  When asked him that she normally drinks he will not give an answer.  He does report that he only drinks on days when he does not have to work.  When it is brought to his attention that he is has not had to work in several weeks because of his recent trauma, he reports that he has not been drinking has been in the hospital.   In the ED he reported that he had woken up this morning feeling tingly and having intermittent numbness to his left arm.  He reports he woke up with the symptoms today.  Did not get any better throughout the day.  EMS was called and in route to the hospital patient had witnessed tonic-clonic activity which lasted for about a minute.  2-1/2 mg of Versed was given which aborted the tonic-clonic activity.  Patient was postictal on arrival to the hospital.  Patient does not remember any of this.   Patient does drink and does smoke as discussed above.  He does not use illicit drugs.  He is vaccinated for COVID.  Patient is full code.   Neurology was consulted and patient was started on Keppra.  He did not have any further seizures.  Unfortunately, the patient began developing alcohol withdrawal manifested by increasing agitation.  He was  transferred to the ICU and placed on Precedex in the early morning 03/23/2022.   On the evening 10/19-10/20, patient developed fever and SVT.  This prolonged his hospitalization.  He was started on metoprolol and electrolytes were optimized. Metoprolol was gradually titrated up to optimize HR.  He remained hemodynamically stable.   During the hospitalization, right forearm noted to develop erythema and edema.  Pt was started on cefazolin.  It did not improve much so MR of forearm was ordered and pt was switched to vancomycin.  This has resulted in prolonged hospitalization.    Hospital Course by Problem  Assessment and Plan: * Seizure (HCC) - secondary to intracranial bleed, SDH, hemorrhagic brain contusions -severe hypomagnesemia may have contributed - Neuro consulted and recommending keppra 500 mg BID, continue at discharge  - Continue CIWA protocol for alcohol withdrawal  -Continue to monitor and replete magnesium -  Seizure precautions, no driving - outpatient follow up with neurology, referral made to [facility] -no further seizures during hospitalization  Delirium tremens (HCC) - Family reports that patient has been a heavy drinker - Patient will not quantify how much he drinks -  his last drink was 10/14 and he had 1 beer per his report - He reports he only drinks on days that he is not working, but he has been out of work due to his recent trauma with subdural hematoma -remained agitated on librium and CIWA -03/23/22 AM--transferred to ICU and started on Precedex -03/24/22 AM--weaned off Precedex -he is being tapered on librium  -discharge home on librium taper  -10/19-repeat CT brain--pt complains of ?diplopia>>>Unchanged trace residual subdural hematoma along the left parietal convexity Overall improved;  A&O x 3 but has some hospital delirium  Superficial thrombophlebitis of right forearm --discussed with orthopedics --supportive therapy with topical warm compresses, heat,  NSAIDs, elevation recommended --MRI was done with no findings of abscess --outpatient follow up with Dr. Dallas Schimke recommended as needed  --treated presumed cellulitis with antibiotics  Cellulitis of arm ?site of previous IV>>infiltration -worsen erythema on cefazolin -MR right forearm>>swelling and mild-to-moderate fluid at the deep aspect of the subcutaneous fat of the posterior forearm; no walled off abscess -start IV vanc>>now improving -no sign of compartment syndrome -appreciate Dr. Dallas Schimke consult -DC on oral doxycycline x 3 more days to complete treatment -no evidence of abscess found -follow up with ortho recommended if any further issues   Fever Developed fever 10/19 evening -UA--no pyuria -personally reviewed CXR--no infiltrates -blood culture neg to date -lactic 0.8 -PCT <0.10 -resolved  Hyperkalemia Treated -repeat BMP>>resolved  SVT (supraventricular tachycardia) 10/19-10/20--pt developed paroxysms of SVT -personally reviewed tele>>possible atrial tach/MAT -started metoprolol>>increase to 75 mg bid -started cardizem CD 120 mg daily -echo EF 55-60%, no WMA, normal RVF -10/16 TSH 0.818  Wrist pain - Patient reports right wrist is painful - Slightly swollen compared to left wrist - Patient does not remember injuring it, so the possibility that it was her during seizure activity - X-ray with no acute fractures found, wrist splint ordered for comfort.   Tobacco use disorder - Patient reports he smokes 2 or 3 cigarettes a day - nicotine patch ordered  - Counseled on importance of cessation  GERD (gastroesophageal reflux disease) - Continue PPI  Hypocalcemia - Calcium 6.1 upon presentation -Corrects to 6.6 for albumin - Replaced with 1 g of calcium gluconate and tums oral TID -  REPLETED   Hypomagnesemia - admission Magnesium is undetectable at less than 0.5 -Secondary to poor p.o. intake and GI losses - repleted  Hypokalemia - Potassium is 2.6 upon  presentation - Secondary to poor p.o. intake and GI losses - continue to replace, recheck in AM  Diarrhea - 2 days of nonbloody diarrhea reported PTA - contributed to to electrolyte abnormalities - The patient continues to have diarrhea here we will get stool studies - No abdominal pain presently - resolved.  Tolerating diet   Discharge Diagnoses:  Principal Problem:   Seizure (HCC) Active Problems:   Diarrhea   Hypokalemia   Hypomagnesemia   Hypocalcemia   GERD (gastroesophageal reflux disease)   Tobacco use disorder   Wrist pain   Delirium tremens (HCC)   Alcohol withdrawal seizure with complication (HCC)   SVT (supraventricular tachycardia)   Hyperkalemia   Fever   Cellulitis of arm   Discharge Instructions: Discharge Instructions     Ambulatory referral to Neurology  Complete by: As directed    An appointment is requested in approximately: 4 weeks      Allergies as of 03/31/2022       Reactions   Codeine    Poison Sumac Extract         Medication List     STOP taking these medications    diazepam 10 MG tablet Commonly known as: VALIUM   HYDROcodone-acetaminophen 5-325 MG tablet Commonly known as: NORCO/VICODIN   NEXIUM PO   POTASSIMIN PO       TAKE these medications    calcium carbonate 500 MG chewable tablet Commonly known as: TUMS - dosed in mg elemental calcium Chew 1 tablet (200 mg of elemental calcium total) by mouth 3 (three) times daily as needed for indigestion or heartburn.   chlordiazePOXIDE 5 MG capsule Commonly known as: LIBRIUM 2 po BID x 3 days, then 1 po BID x 3 days then 1 po daily x 3 days   diltiazem 120 MG 24 hr capsule Commonly known as: CARDIZEM CD Take 1 capsule (120 mg total) by mouth daily.   doxycycline 100 MG capsule Commonly known as: VIBRAMYCIN Take 1 capsule (100 mg total) by mouth 2 (two) times daily for 3 days. Start taking on: April 01, 2022   folic acid 1 MG tablet Commonly known as:  FOLVITE Take 1 tablet (1 mg total) by mouth daily. Start taking on: April 01, 2022   levETIRAcetam 500 MG tablet Commonly known as: KEPPRA Take 1 tablet (500 mg total) by mouth 2 (two) times daily.   magnesium oxide 400 (240 Mg) MG tablet Commonly known as: MAG-OX Take 1 tablet (400 mg total) by mouth 2 (two) times daily.   Metoprolol Tartrate 75 MG Tabs Take 75 mg by mouth 2 (two) times daily.   multivitamin with minerals Tabs tablet Take 1 tablet by mouth once for 1 dose.   omeprazole 20 MG capsule Commonly known as: PRILOSEC Take 1 capsule (20 mg total) by mouth daily. Alternates with nexium What changed:  when to take this reasons to take this   saccharomyces boulardii 250 MG capsule Commonly known as: FLORASTOR Take 1 capsule (250 mg total) by mouth 2 (two) times daily for 14 days.   thiamine 100 MG tablet Commonly known as: Vitamin B-1 Take 1 tablet (100 mg total) by mouth daily. Start taking on: April 01, 2022        Follow-up Information     [facility] Follow up.   Specialty: Home Health Services Why: Will contact you to schedule home health visits. Contact information: [address] [place] [address] [phone]         Assunta Found, MD. Schedule an appointment as soon as possible for a visit in 1 week(s).   Specialty: Family Medicine Why: Hospital Follow Up Contact information: [address] [place] [address] [phone]         Oliver Barre, MD. Schedule an appointment as soon as possible for a visit in 2 week(s).   Specialties: Orthopedic Surgery, Sports Medicine Why: Hospital Follow Up for right forearm check up Contact information: [address] [place] [address] [phone]         [facility]. Schedule an appointment as soon as possible for a visit in 1 month(s).   Specialty: Neurology Why: Hospital Follow Up Contact information: [address] [place] [address] [phone]  Allergies  Allergen Reactions   Codeine    Poison Sumac Extract    Allergies as of 03/31/2022       Reactions   Codeine    Poison Sumac Extract         Medication List     STOP taking these medications    diazepam 10 MG tablet Commonly known as: VALIUM   HYDROcodone-acetaminophen 5-325 MG tablet Commonly known as: NORCO/VICODIN   NEXIUM PO   POTASSIMIN PO       TAKE these medications    calcium carbonate 500 MG chewable tablet Commonly known as: TUMS - dosed in mg elemental calcium Chew 1 tablet (200 mg of elemental calcium total) by mouth 3 (three) times daily as needed for indigestion or heartburn.   chlordiazePOXIDE 5 MG capsule Commonly known as: LIBRIUM 2 po BID x 3 days, then 1 po BID x 3 days then 1 po daily x 3 days   diltiazem 120 MG 24 hr capsule Commonly known as: CARDIZEM CD Take 1 capsule (120 mg total) by mouth daily.   doxycycline 100 MG capsule Commonly known as: VIBRAMYCIN Take 1 capsule (100 mg total) by mouth 2 (two) times daily for 3 days. Start taking on: April 01, 2022   folic acid 1 MG tablet Commonly known as: FOLVITE Take 1 tablet (1 mg total) by mouth daily. Start taking on: April 01, 2022   levETIRAcetam 500 MG tablet Commonly known as: KEPPRA Take 1 tablet (500 mg total) by mouth 2 (two) times daily.   magnesium oxide 400 (240 Mg) MG tablet Commonly known as: MAG-OX Take 1 tablet (400 mg total) by mouth 2 (two) times daily.   Metoprolol Tartrate 75 MG Tabs Take 75 mg by mouth 2 (two) times daily.   multivitamin with minerals Tabs tablet Take 1 tablet by mouth once for 1 dose.   omeprazole 20 MG capsule Commonly known as: PRILOSEC Take 1 capsule (20 mg total) by mouth daily. Alternates with nexium What changed:  when to take this reasons to take this   saccharomyces boulardii 250 MG capsule Commonly known as: FLORASTOR Take 1 capsule  (250 mg total) by mouth 2 (two) times daily for 14 days.   thiamine 100 MG tablet Commonly known as: Vitamin B-1 Take 1 tablet (100 mg total) by mouth daily. Start taking on: April 01, 2022        Procedures/Studies: US Venous Img Upper Uni Right(DVT)  Result Date: 03/30/2022 CLINICAL DATA:  Right upper extremity edema. EXAM: RIGHT UPPER EXTREMITY VENOUS DOPPLER ULTRASOUND TECHNIQUE: Gray-scale sonography with graded compression, as well as color Doppler and duplex ultrasound were performed to evaluate the upper extremity deep venous system from the level of the subclavian vein and including the jugular, axillary, basilic, radial, ulnar and upper cephalic vein. Spectral Doppler was utilized to evaluate flow at rest and with distal augmentation maneuvers. COMPARISON:  None Available. FINDINGS: Contralateral Subclavian Vein: Respiratory phasicity is normal and symmetric with the symptomatic side. No evidence of thrombus. Normal compressibility. Internal Jugular Vein: No evidence of thrombus. Normal compressibility, respiratory phasicity and response to augmentation. Subclavian Vein: No evidence of thrombus. Normal compressibility, respiratory phasicity and response to augmentation. Axillary Vein: No evidence of thrombus. Normal compressibility, respiratory phasicity and response to augmentation. Cephalic Vein: Superficial thrombophlebitis of the right cephalic vein is noted in the forearm. Basilic Vein: No evidence of thrombus. Normal compressibility, respiratory phasicity and response to augmentation. Brachial Veins: No evidence of thrombus. Normal compressibility, respiratory phasicity  and response to augmentation. Radial Veins: No evidence of thrombus. Normal compressibility, respiratory phasicity and response to augmentation. Ulnar Veins: No evidence of thrombus. Normal compressibility, respiratory phasicity and response to augmentation. Venous Reflux:  None visualized. Other Findings:  None  visualized. IMPRESSION: 1. No evidence of DVT within the right upper extremity. 2. Superficial thrombophlebitis of the right cephalic vein in the forearm. Electronically Signed   By: Irish Lack M.D.   On: 03/30/2022 11:37   MR FOREARM RIGHT WO CONTRAST  Result Date: 03/28/2022 CLINICAL DATA:  Deep forearm soft tissue mass. Cellulitis. Possible abscess. EXAM: MRI OF THE RIGHT FOREARM WITHOUT CONTRAST TECHNIQUE: Multiplanar, multisequence MR imaging of the proximal right forearm was performed. No intravenous contrast was administered. COMPARISON:  right humerus radiographs 01/24/2022 FINDINGS: Bones/Joint/Cartilage Moderate radial head-radial notch of the ulna joint space narrowing and peripheral osteophytosis. Focal edema within the medial aspect of the radial head at this articulation. Mild-to-moderate peripheral trochlea and coronoid process degenerative spurring. No acute fracture or dislocation. No cortical erosion. Ligaments No gross ulnar collateral ligament or radial collateral ligaments of the elbow tear is seen. Muscles and Tendons The biceps brachii, brachialis, and triceps tendon insertions are intact at the elbow. There is mild edema within the volar aspect of the flexor carpi radialis muscle (axial series 4 images 16 through 19). Soft tissues A marker overlies the proximal forearm volar soft tissues at the level of the proximal radial and ulnar diaphyses. There is moderate edema and swelling of the subcutaneous fat deep to this region, surrounding the underlying basilic vein (axial series 4 images 7 through 29). Additional swelling and mild-to-moderate fluid at the deep aspect of the subcutaneous fat of the posterior forearm, starting at the level of the olecranon where the fluid measures up to 1.1 cm in AP dimension and approximately 6.4 cm in transverse dimension. This fluid tapers distally, but is seen within the subcutaneous fat throughout the majority of the forearm, measuring at least 15  cm in craniocaudal dimension. There is moderate edema and swelling of the additional medial forearm subcutaneous fat. No elbow joint effusion. IMPRESSION: 1. In the area of interest, there is moderate edema and swelling of the volar subcutaneous fat of the proximal forearm, surrounding the underlying basilic vein. Given the of inflammation around the basilic vein, if there is clinical concern for a thrombus in the basilic vein (a superficial vein), consider right upper extremity venous doppler ultrasound. 2. Additional swelling and mild-to-moderate fluid at the deep aspect of the subcutaneous fat of the posterior forearm, starting at the level of the olecranon where the fluid is greatest, and tapering distally throughout the majority of the posterior forearm. No definitive walled-off abscess is seen, however MRI cannot determine whether this fluid is bland or infected. Electronically Signed   By: Neita Garnet M.D.   On: 03/28/2022 16:23   ECHOCARDIOGRAM COMPLETE  Result Date: 03/25/2022    ECHOCARDIOGRAM REPORT   Patient Name:   AVIAN KONIGSBERG [facility] Date of Exam: 03/25/2022 Medical Rec #:  [id number]    Height:       69.0 in Accession #:    [id number]   Weight:       144.2 lb Date of Birth:  May 18, 1961   BSA:          1.798 m Patient Age:    61 years     BP:           154/96 mmHg Patient Gender: M  HR:           114 bpm. [facility] Procedure: 2D Echo, Cardiac Doppler and Color Doppler Indications:    SVT  History:        Patient has no prior history of Echocardiogram examinations.                 Arrythmias:Tachycardia; Risk Factors:Hypertension and Current                 Smoker.  Sonographer:    Mikki Harbor Referring Phys: [id number] DAVID TAT IMPRESSIONS  1. Left ventricular ejection fraction, by estimation, is 55 to 60%. The left ventricle has normal function. The left ventricle has no regional wall motion abnormalities. Left ventricular diastolic parameters were normal.  2. Right  ventricular systolic function is normal. The right ventricular size is normal. There is normal pulmonary artery systolic pressure.  3. The mitral valve is grossly normal. No evidence of mitral valve regurgitation. No evidence of mitral stenosis.  4. The aortic valve is normal in structure. Aortic valve regurgitation is not visualized. No aortic stenosis is present.  5. The inferior vena cava is normal in size with greater than 50% respiratory variability, suggesting right atrial pressure of 3 mmHg. FINDINGS  Left Ventricle: Left ventricular ejection fraction, by estimation, is 55 to 60%. The left ventricle has normal function. The left ventricle has no regional wall motion abnormalities. The left ventricular internal cavity size was normal in size. There is  no left ventricular hypertrophy. Left ventricular diastolic parameters were normal. Right Ventricle: The right ventricular size is normal. No increase in right ventricular wall thickness. Right ventricular systolic function is normal. There is normal pulmonary artery systolic pressure. The tricuspid regurgitant velocity is 2.62 m/s, and  with an assumed right atrial pressure of 3 mmHg, the estimated right ventricular systolic pressure is 30.5 mmHg. Left Atrium: Left atrial size was normal in size. Right Atrium: Right atrial size was normal in size. Pericardium: Trivial pericardial effusion is present. Mitral Valve: The mitral valve is grossly normal. No evidence of mitral valve regurgitation. No evidence of mitral valve stenosis. MV peak gradient, 2.0 mmHg. The mean mitral valve gradient is 1.0 mmHg. Tricuspid Valve: The tricuspid valve is grossly normal. Tricuspid valve regurgitation is mild . No evidence of tricuspid stenosis. Aortic Valve: The aortic valve is normal in structure. Aortic valve regurgitation is not visualized. No aortic stenosis is present. Aortic valve mean gradient measures 2.0 mmHg. Aortic valve peak gradient measures 4.4 mmHg. Aortic valve  area, by VTI measures 2.70 cm. Pulmonic Valve: The pulmonic valve was grossly normal. Pulmonic valve regurgitation is trivial. No evidence of pulmonic stenosis. Aorta: The aortic root is normal in size and structure. Venous: The inferior vena cava is normal in size with greater than 50% respiratory variability, suggesting right atrial pressure of 3 mmHg. IAS/Shunts: No atrial level shunt detected by color flow Doppler.  LEFT VENTRICLE PLAX 2D LVIDd:         4.00 cm   Diastology LVIDs:         2.90 cm   LV e' medial:    9.03 cm/s LV PW:         1.20 cm   LV E/e' medial:  5.7 LV IVS:        1.20 cm   LV e' lateral:   8.05 cm/s LVOT diam:     2.10 cm   LV E/e' lateral: 6.4 LV SV:  43 LV SV Index:   24 LVOT Area:     3.46 cm  RIGHT VENTRICLE RV Basal diam:  3.20 cm RV Mid diam:    2.20 cm RV S prime:     16.90 cm/s TAPSE (M-mode): 2.4 cm LEFT ATRIUM             Index        RIGHT ATRIUM           Index LA diam:        2.80 cm 1.56 cm/m   RA Area:     14.90 cm LA Vol (A2C):   34.0 ml 18.91 ml/m  RA Volume:   40.00 ml  22.25 ml/m LA Vol (A4C):   20.6 ml 11.46 ml/m LA Biplane Vol: 27.4 ml 15.24 ml/m  AORTIC VALVE                    PULMONIC VALVE AV Area (Vmax):    2.89 cm     PV Vmax:       1.29 m/s AV Area (Vmean):   2.82 cm     PV Peak grad:  6.7 mmHg AV Area (VTI):     2.70 cm AV Vmax:           105.00 cm/s AV Vmean:          70.900 cm/s AV VTI:            0.158 m AV Peak Grad:      4.4 mmHg AV Mean Grad:      2.0 mmHg LVOT Vmax:         87.50 cm/s LVOT Vmean:        57.700 cm/s LVOT VTI:          0.123 m LVOT/AV VTI ratio: 0.78  AORTA Ao Root diam: 3.30 cm MITRAL VALVE               TRICUSPID VALVE MV Area (PHT): 5.58 cm    TR Peak grad:   27.5 mmHg MV Area VTI:   3.46 cm    TR Vmax:        262.00 cm/s MV Peak grad:  2.0 mmHg MV Mean grad:  1.0 mmHg    SHUNTS MV Vmax:       0.71 m/s    Systemic VTI:  0.12 m MV Vmean:      43.3 cm/s   Systemic Diam: 2.10 cm MV Decel Time: 136 msec MV E velocity:  51.80 cm/s MV A velocity: 65.50 cm/s MV E/A ratio:  0.79 Vishnu Priya Mallipeddi Electronically signed by Winfield Rast Mallipeddi Signature Date/Time: 03/25/2022/1:55:03 PM    Final    DG CHEST PORT 1 VIEW  Result Date: 03/24/2022 CLINICAL DATA:  Fever and cough. EXAM: PORTABLE CHEST 1 VIEW COMPARISON:  Chest radiograph dated 01/24/2022 and CT dated 01/25/2019. FINDINGS: Mild centrilobular emphysema. No focal consolidation, pleural effusion, or pneumothorax. The cardiac silhouette is within normal limits. No acute osseous pathology. IMPRESSION: No active disease. Electronically Signed   By: Elgie Collard M.D.   On: 03/24/2022 18:52   CT HEAD WO CONTRAST ( )  Result Date: 03/24/2022 CLINICAL DATA:  Recent subdural hematoma.  Double vision. EXAM: CT HEAD WITHOUT CONTRAST TECHNIQUE: Contiguous axial images were obtained from the base of the skull through the vertex without intravenous contrast. RADIATION DOSE REDUCTION: This exam was performed according to the departmental dose-optimization program which includes automated exposure control, adjustment of the mA and/or kV  according to patient size and/or use of iterative reconstruction technique. COMPARISON:  Brain MRI from 3 days ago FINDINGS: Brain: Left inferior and anterior temporal encephalomalacia, there is history of cerebral contusions. Motion artifact especially affecting the surface of the brain but still visible and stable subdural hematoma along the left parietal convexity measuring 3 mm in thickness, see 4: 54. No hydrocephalus or masslike finding Vascular: No hyperdense vessel or unexpected calcification. Skull: Normal. Negative for fracture or focal lesion. Sinuses/Orbits: No acute finding. IMPRESSION: 1. Unchanged trace residual subdural hematoma along the left parietal convexity, up to 3 mm in thickness. 2. Sequela of anterior left frontal and temporal contusions. Electronically Signed   By: Tiburcio Pea M.D.   On: 03/24/2022 13:20    MR CERVICAL SPINE WO CONTRAST  Result Date: 03/21/2022 CLINICAL DATA:  Paresthesias, dizziness, cervical radiculopathy EXAM: MRI CERVICAL AND THORACIC SPINE WITHOUT CONTRAST TECHNIQUE: Multiplanar and multiecho pulse sequences of the cervical spine, to include the craniocervical junction and cervicothoracic junction, and the thoracic spine, were obtained without intravenous contrast. COMPARISON:  No prior MRI of the cervical or thoracic spine, correlation is made with 01/24/2022 CT cervical spine FINDINGS: MRI CERVICAL SPINE FINDINGS Evaluation is somewhat limited by motion artifact. Alignment: No listhesis. Vertebrae: No fracture, evidence of discitis, or bone lesion. Cord: Normal signal and morphology. Posterior Fossa, vertebral arteries, paraspinal tissues: Negative. Disc levels: C2-C3: No significant disc bulge. No spinal canal stenosis or neuroforaminal narrowing. C3-C4: No significant disc bulge. No spinal canal stenosis or neuroforaminal narrowing. C4-C5: Disc height loss and mild disc bulge. Facet and uncovertebral hypertrophy, right-greater-than-left. No spinal canal stenosis. Mild-to-moderate right neural foraminal narrowing. C5-C6: Disc height loss and mild disc bulge. Facet and uncovertebral hypertrophy. No spinal canal stenosis. Mild bilateral neural foraminal narrowing. C6-C7: Disc height loss and mild disc bulge with disc osteophyte complex. Facet and uncovertebral hypertrophy. No spinal canal stenosis. Mild bilateral neural foraminal narrowing. C7-T1: No significant disc bulge. No spinal canal stenosis or neuroforaminal narrowing. MRI THORACIC SPINE FINDINGS Alignment: No listhesis. Preservation of the normal thoracic kyphosis. Vertebrae: No fracture, evidence of discitis, or bone lesion. Cord:  Normal signal and morphology. Paraspinal and other soft tissues: Trace pleural effusions. Disc levels: No significant spinal canal stenosis or neural foraminal narrowing. IMPRESSION: 1. C4-C5  mild-to-moderate right neural foraminal narrowing. 2. C5-C6 and C6-C7 mild bilateral neural foraminal narrowing. 3. No spinal canal stenosis in the cervical spine. 4. No significant spinal canal stenosis or neural foraminal narrowing in the thoracic spine. 5. Trace pleural effusions. Electronically Signed   By: Wiliam Ke M.D.   On: 03/21/2022 18:44   MR THORACIC SPINE WO CONTRAST  Result Date: 03/21/2022 CLINICAL DATA:  Paresthesias, dizziness, cervical radiculopathy EXAM: MRI CERVICAL AND THORACIC SPINE WITHOUT CONTRAST TECHNIQUE: Multiplanar and multiecho pulse sequences of the cervical spine, to include the craniocervical junction and cervicothoracic junction, and the thoracic spine, were obtained without intravenous contrast. COMPARISON:  No prior MRI of the cervical or thoracic spine, correlation is made with 01/24/2022 CT cervical spine FINDINGS: MRI CERVICAL SPINE FINDINGS Evaluation is somewhat limited by motion artifact. Alignment: No listhesis. Vertebrae: No fracture, evidence of discitis, or bone lesion. Cord: Normal signal and morphology. Posterior Fossa, vertebral arteries, paraspinal tissues: Negative. Disc levels: C2-C3: No significant disc bulge. No spinal canal stenosis or neuroforaminal narrowing. C3-C4: No significant disc bulge. No spinal canal stenosis or neuroforaminal narrowing. C4-C5: Disc height loss and mild disc bulge. Facet and uncovertebral hypertrophy, right-greater-than-left. No spinal canal stenosis. Mild-to-moderate right  neural foraminal narrowing. C5-C6: Disc height loss and mild disc bulge. Facet and uncovertebral hypertrophy. No spinal canal stenosis. Mild bilateral neural foraminal narrowing. C6-C7: Disc height loss and mild disc bulge with disc osteophyte complex. Facet and uncovertebral hypertrophy. No spinal canal stenosis. Mild bilateral neural foraminal narrowing. C7-T1: No significant disc bulge. No spinal canal stenosis or neuroforaminal narrowing. MRI THORACIC  SPINE FINDINGS Alignment: No listhesis. Preservation of the normal thoracic kyphosis. Vertebrae: No fracture, evidence of discitis, or bone lesion. Cord:  Normal signal and morphology. Paraspinal and other soft tissues: Trace pleural effusions. Disc levels: No significant spinal canal stenosis or neural foraminal narrowing. IMPRESSION: 1. C4-C5 mild-to-moderate right neural foraminal narrowing. 2. C5-C6 and C6-C7 mild bilateral neural foraminal narrowing. 3. No spinal canal stenosis in the cervical spine. 4. No significant spinal canal stenosis or neural foraminal narrowing in the thoracic spine. 5. Trace pleural effusions. Electronically Signed   By: Wiliam Ke M.D.   On: 03/21/2022 18:44   MR BRAIN WO CONTRAST  Result Date: 03/21/2022 CLINICAL DATA:  Paresthesias, dizziness, could be secondary to fall induced neuropathy or spinal stenosis. Concern for seizure. EXAM: MRI HEAD WITHOUT CONTRAST TECHNIQUE: Multiplanar, multiecho pulse sequences of the brain and surrounding structures were obtained without intravenous contrast. COMPARISON:  No prior MRI, correlation is made with head CT 03/20/2022 FINDINGS: Evaluation is somewhat limited by motion artifact. Brain: No restricted diffusion to suggest acute or subacute infarct. No acute hemorrhage, mass, mass effect, or midline shift. No hydrocephalus. 5 mm residual left parietal subdural hematoma (series 18, image 7), as seen on the recent CT. No new extra-axial collection. Remote right posterior lentiform nucleus lacunar infarct. Encephalomalacia in the left anteromedial temporal lobe (series 15, image 12) and left inferior frontal lobe (series 15, images 19 and 22), likely the sequela of prior trauma. These areas are associated with hemosiderin deposition. The hippocampi are symmetric in size and signal. No heterotopia or evidence of cortical dysgenesis. Vascular: Normal arterial flow voids. Skull and upper cervical spine: Normal marrow signal. Sinuses/Orbits:  Mucosal thickening in the maxillary sinuses and ethmoid air cells. The orbits are unremarkable. Other: Trace fluid in left mastoid air cells. IMPRESSION: 1. No acute intracranial process. No acute or subacute infarct. No seizure etiology identified. 2. 5 mm residual left parietal subdural hematoma, as seen on the recent CT. 3. Encephalomalacia in the left anteromedial temporal lobe and left inferior frontal lobe, likely the sequela of prior trauma. Electronically Signed   By: Wiliam Ke M.D.   On: 03/21/2022 18:20   DG Wrist 2 Views Right  Result Date: 03/20/2022 CLINICAL DATA:  Wrist pain. EXAM: RIGHT WRIST - 2 VIEW COMPARISON:  None Available. FINDINGS: There is soft tissue swelling surrounding the wrist. There is no acute fracture or dislocation. There is mild radiocarpal joint space narrowing. IMPRESSION: 1. No acute fracture or dislocation. 2. Soft tissue swelling surrounding the wrist. Electronically Signed   By: Darliss Cheney M.D.   On: 03/20/2022 23:04   CT HEAD WO CONTRAST  Result Date: 03/20/2022 CLINICAL DATA:  New onset seizure, recent trauma with subdural hematoma. EXAM: CT HEAD WITHOUT CONTRAST TECHNIQUE: Contiguous axial images were obtained from the base of the skull through the vertex without intravenous contrast. RADIATION DOSE REDUCTION: This exam was performed according to the departmental dose-optimization program which includes automated exposure control, adjustment of the mA and/or kV according to patient size and/or use of iterative reconstruction technique. COMPARISON:  01/25/2022 FINDINGS: Brain: No evidence of acute infarction, hydrocephalus,  extra-axial collection or mass lesion/mass effect. Trace residual left parietal subdural hematoma (coronal image 52), measuring 5 mm in thickness, previously 19 mm. Encephalomalacic changes in the left frontal lobe (series 2/image 11). Prior left frontal contusion with subarachnoid hemorrhage has resolved. Subcortical white matter and  periventricular small vessel ischemic changes. Vascular: Intracranial atherosclerosis. Skull: Normal. Negative for fracture or focal lesion. Sinuses/Orbits: Minimal partial opacification of the bilateral maxillary sinuses. Mastoid air cells are clear. Other: None. IMPRESSION: Trace residual left parietal subdural hematoma, as above. Encephalomalacic changes in the left frontal lobe at the site of prior left frontal contusion. Small vessel ischemic changes. Electronically Signed   By: Charline BillsSriyesh  Krishnan M.D.   On: 03/20/2022 18:13     Subjective: Pt says he will be living with his brother and sister in law.  No specific complaints.    Discharge Exam: Vitals:   03/31/22 0600 03/31/22 0624  BP: (!) 122/99 116/75  Pulse: (!) 101 89  Resp: 19 10  Temp: 97.9 F (36.6 C) 98.2 F (36.8 C)  SpO2: 96% 96%   Vitals:   03/30/22 1336 03/30/22 2109 03/31/22 0600 03/31/22 0624  BP: 106/71 113/79 (!) 122/99 116/75  Pulse: 88 88 (!) 101 89  Resp: 18 18 19 10   Temp: 99 F (37.2 C) (!) 97.4 F (36.3 C) 97.9 F (36.6 C) 98.2 F (36.8 C)  TempSrc: Oral  Oral   SpO2: 97% 100% 96% 96%  Weight:    67 kg  Height:    5\' 9"  (1.753 m)    General: Pt is intermittently confused but directable, awake, not in acute distress Cardiovascular: RRR, S1/S2 +, no rubs, no gallops Respiratory: CTA bilaterally, no wheezing, no rhonchi Abdominal: Soft, NT, ND, bowel sounds + Extremities: right forearm, less red, less swollen, nontender, no cyanosis   The results of significant diagnostics from this hospitalization (including imaging, microbiology, ancillary and laboratory) are listed below for reference.     Microbiology: Recent Results (from the past 240 hour(s))  MRSA Next Gen by PCR, Nasal     Status: None   Collection Time: 03/22/22 11:40 PM   Specimen: Nasal Mucosa; Nasal Swab  Result Value Ref Range Status   MRSA by PCR Next Gen NOT DETECTED NOT DETECTED Final    Comment: (NOTE) The GeneXpert MRSA Assay  (FDA approved for NASAL specimens only), is one component of a comprehensive MRSA colonization surveillance program. It is not intended to diagnose MRSA infection nor to guide or monitor treatment for MRSA infections. Test performance is not FDA approved in patients less than 59 years old. Performed at [facility], [address] [place], [place] [address]   Urine Culture     Status: Abnormal   Collection Time: 03/24/22  5:18 PM   Specimen: Urine, Clean Catch  Result Value Ref Range Status   Specimen Description   Final    URINE, CLEAN CATCH Performed at [facility], [address] [place], [place] [address]    Special Requests   Final    NONE Performed at [facility], [address] [place], [place] [address]    Culture (A)  Final    <10,000 COLONIES/mL INSIGNIFICANT GROWTH Performed at [facility], [address] [place], [place] [address]    Report Status 03/25/2022 FINAL  Final  Culture, blood (Routine X 2) w Reflex to ID Panel     Status: None   Collection Time: 03/24/22  5:52 PM   Specimen: BLOOD  Result Value  Ref Range Status   Specimen Description BLOOD LEFT ANTECUBITAL  Final   Special Requests   Final    BOTTLES DRAWN AEROBIC AND ANAEROBIC Blood Culture adequate volume   Culture   Final    NO GROWTH 5 DAYS Performed at [facility], [address], [place], [place] [address]    Report Status 03/29/2022 FINAL  Final  Culture, blood (Routine X 2) w Reflex to ID Panel     Status: None   Collection Time: 03/24/22  5:52 PM   Specimen: BLOOD  Result Value Ref Range Status   Specimen Description BLOOD LEFT WRIST  Final   Special Requests   Final    BOTTLES DRAWN AEROBIC AND ANAEROBIC Blood Culture results may not be optimal due to an excessive volume of blood received in culture bottles   Culture   Final    NO GROWTH 5 DAYS Performed at [facility], [address], [place], [place] [address]    Report Status 03/29/2022 FINAL  Final     Labs: BNP (last 3  results) No results for input(s): "BNP" in the last 8760 hours. Basic Metabolic Panel: Recent Labs  Lab 03/26/22 0416 03/27/22 0343 03/28/22 0428 03/29/22 0655 03/30/22 0426  NA 137 137 133* 133* 133*  K 3.8 3.6 3.7 3.3* 4.0  CL 106 106 99 100 100  CO2 21* 21* 22 22 24   GLUCOSE 110* 104* 110* 109* 108*  BUN 7 6 5* 7 10  CREATININE 0.57* 0.64 0.58* 0.67 0.78  CALCIUM 9.1 8.5* 8.7* 8.4* 8.9  MG 1.6* 1.8 1.5* 1.9 1.8   Liver Function Tests: Recent Labs  Lab 03/25/22 0330  AST 23  ALT 16  ALKPHOS 94  BILITOT 1.0  PROT 7.0  ALBUMIN 2.9*   No results for input(s): "LIPASE", "AMYLASE" in the last 168 hours. No results for input(s): "AMMONIA" in the last 168 hours. CBC: Recent Labs  Lab 03/25/22 0330 03/28/22 0428 03/29/22 0655 03/30/22 0426  WBC 6.4 6.3 3.1* 3.2*  HGB 12.2* 10.7* 11.2* 11.1*  HCT 36.0* 31.8* 33.7* 33.4*  MCV 100.6* 99.4 97.7 98.8  PLT 295 314 329 358   Cardiac Enzymes: Recent Labs  Lab 03/25/22 0330  CKTOTAL 118   BNP: Invalid input(s): "POCBNP" CBG: No results for input(s): "GLUCAP" in the last 168 hours. D-Dimer No results for input(s): "DDIMER" in the last 72 hours. Hgb A1c No results for input(s): "HGBA1C" in the last 72 hours. Lipid Profile No results for input(s): "CHOL", "HDL", "LDLCALC", "TRIG", "CHOLHDL", "LDLDIRECT" in the last 72 hours. Thyroid function studies No results for input(s): "TSH", "T4TOTAL", "T3FREE", "THYROIDAB" in the last 72 hours.  Invalid input(s): "FREET3" Anemia work up No results for input(s): "VITAMINB12", "FOLATE", "FERRITIN", "TIBC", "IRON", "RETICCTPCT" in the last 72 hours. Urinalysis    Component Value Date/Time   COLORURINE YELLOW 03/24/2022 1718   APPEARANCEUR CLEAR 03/24/2022 1718   LABSPEC 1.013 03/24/2022 1718   PHURINE 9.0 (H) 03/24/2022 1718   GLUCOSEU 50 (A) 03/24/2022 1718   HGBUR NEGATIVE 03/24/2022 1718   BILIRUBINUR NEGATIVE 03/24/2022 1718   KETONESUR NEGATIVE 03/24/2022 1718    PROTEINUR NEGATIVE 03/24/2022 1718   NITRITE NEGATIVE 03/24/2022 1718   LEUKOCYTESUR NEGATIVE 03/24/2022 1718   Sepsis Labs Recent Labs  Lab 03/25/22 0330 03/28/22 0428 03/29/22 0655 03/30/22 0426  WBC 6.4 6.3 3.1* 3.2*   Microbiology Recent Results (from the past 240 hour(s))  MRSA Next Gen by PCR, Nasal     Status: None  Collection Time: 03/22/22 11:40 PM   Specimen: Nasal Mucosa; Nasal Swab  Result Value Ref Range Status   MRSA by PCR Next Gen NOT DETECTED NOT DETECTED Final    Comment: (NOTE) The GeneXpert MRSA Assay (FDA approved for NASAL specimens only), is one component of a comprehensive MRSA colonization surveillance program. It is not intended to diagnose MRSA infection nor to guide or monitor treatment for MRSA infections. Test performance is not FDA approved in patients less than 26 years old. Performed at [facility], [address] [place], [place] [address]   Urine Culture     Status: Abnormal   Collection Time: 03/24/22  5:18 PM   Specimen: Urine, Clean Catch  Result Value Ref Range Status   Specimen Description   Final    URINE, CLEAN CATCH Performed at [facility], [address], [place], [place] [address]    Special Requests   Final    NONE Performed at [facility], [address], [place], [place] [address]    Culture (A)  Final    <10,000 COLONIES/mL INSIGNIFICANT GROWTH Performed at [facility], [place] [address] [place], [place] [address]    Report Status 03/25/2022 FINAL  Final  Culture, blood (Routine X 2) w Reflex to ID Panel     Status: None   Collection Time: 03/24/22  5:52 PM   Specimen: BLOOD  Result Value Ref Range Status   Specimen Description BLOOD LEFT ANTECUBITAL  Final   Special Requests   Final    BOTTLES DRAWN AEROBIC AND ANAEROBIC Blood Culture adequate volume   Culture   Final    NO GROWTH 5 DAYS Performed at [facility], [address] [place], [place] [address]    Report Status 03/29/2022 FINAL  Final   Culture, blood (Routine X 2) w Reflex to ID Panel     Status: None   Collection Time: 03/24/22  5:52 PM   Specimen: BLOOD  Result Value Ref Range Status   Specimen Description BLOOD LEFT WRIST  Final   Special Requests   Final    BOTTLES DRAWN AEROBIC AND ANAEROBIC Blood Culture results may not be optimal due to an excessive volume of blood received in culture bottles   Culture   Final    NO GROWTH 5 DAYS Performed at [facility], [address], [place], [place] [address]    Report Status 03/29/2022 FINAL  Final    Time coordinating discharge: 37 mins   SIGNED:  Irwin Brakeman, MD  [facility] 03/31/2022, 11:23 AM How to contact the [facility] Attending or Consulting provider [place] or covering provider during after hours [place], for this patient?  Check the care team in [facility] and look for a) attending/consulting [facility] provider listed and b) the [facility] team listed Log into [email or web address] and use [place]'s universal password to access. If you do not have the password, please contact the hospital operator. Locate the [facility] provider you are looking for under [facility] and page to a number that you can be directly reached. If you still have difficulty reaching the provider, please page the [facility] (Director on Call) for the Hospitalists listed on amion for assistance.
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
## 2022-03-31 NOTE — Discharge Instructions (Addendum)
Seizure precautions: Per [facility] statutes, patients with seizures are not allowed to drive until they have been seizure-free for six months and cleared by a physician    Use caution when using heavy equipment or power tools. Avoid working on ladders or at heights. Take showers instead of baths. Ensure the water temperature is not too high on the home water heater. Do not go swimming alone. Do not lock yourself in a room alone (i.e. bathroom). When caring for infants or small children, sit down when holding, feeding, or changing them to minimize risk of injury to the child in the event you have a seizure. Maintain good sleep hygiene. Avoid alcohol.    If patient has another seizure, call 911 and bring them back to the ED if: A.  The seizure lasts longer than 5 minutes.      B.  The patient doesn't wake shortly after the seizure or has new problems such as difficulty seeing, speaking or moving following the seizure C.  The patient was injured during the seizure D.  The patient has a temperature over 102 F (39C) E.  The patient vomited during the seizure and now is having trouble breathing    During the Seizure   - First, ensure adequate ventilation and place patients on the floor on their left side  Loosen clothing around the neck and ensure the airway is patent. If the patient is clenching the teeth, do not force the mouth open with any object as this can cause severe damage - Remove all items from the surrounding that can be hazardous. The patient may be oblivious to what's happening and may not even know what he or she is doing. If the patient is confused and wandering, either gently guide him/her away and block access to outside areas - Reassure the individual and be comforting - Call 911. In most cases, the seizure ends before EMS arrives. However, there are cases when seizures may last over 3 to 5 minutes. Or the individual may have developed breathing difficulties or severe  injuries. If a pregnant patient or a person with diabetes develops a seizure, it is prudent to call an ambulance. - Finally, if the patient does not regain full consciousness, then call EMS. Most patients will remain confused for about 45 to 90 minutes after a seizure, so you must use judgment in calling for help.      After the Seizure (Postictal Stage)   After a seizure, most patients experience confusion, fatigue, muscle pain and/or a headache. Thus, one should permit the individual to sleep. For the next few days, reassurance is essential. Being calm and helping reorient the person is also of importance.   Most seizures are painless and end spontaneously. Seizures are not harmful to others but can lead to complications such as stress on the lungs, brain and the heart. Individuals with prior lung problems may develop labored breathing and respiratory distress.     IMPORTANT INFORMATION: PAY CLOSE ATTENTION   PHYSICIAN DISCHARGE INSTRUCTIONS  Follow with Primary care provider  Assunta Found, MD  and other consultants as instructed by your Hospitalist Physician  SEEK MEDICAL CARE OR RETURN TO EMERGENCY ROOM IF SYMPTOMS COME BACK, WORSEN OR NEW PROBLEM DEVELOPS   Please note: You were cared for by a hospitalist during your hospital stay. Every effort will be made to forward records to your primary care provider.  You can request that your primary care provider send for your hospital records if they  have not received them.  Once you are discharged, your primary care physician will handle any further medical issues. Please note that NO REFILLS for any discharge medications will be authorized once you are discharged, as it is imperative that you return to your primary care physician (or establish a relationship with a primary care physician if you do not have one) for your post hospital discharge needs so that they can reassess your need for medications and monitor your lab values.  Please get a  complete blood count and chemistry panel checked by your Primary MD at your next visit, and again as instructed by your Primary MD.  Get Medicines reviewed and adjusted: Please take all your medications with you for your next visit with your Primary MD  Laboratory/radiological data: Please request your Primary MD to go over all hospital tests and procedure/radiological results at the follow up, please ask your primary care provider to get all Hospital records sent to his/her office.  In some cases, they will be blood work, cultures and biopsy results pending at the time of your discharge. Please request that your primary care provider follow up on these results.  If you are diabetic, please bring your blood sugar readings with you to your follow up appointment with primary care.    Please call and make your follow up appointments as soon as possible.    Also Note the following: If you experience worsening of your admission symptoms, develop shortness of breath, life threatening emergency, suicidal or homicidal thoughts you must seek medical attention immediately by calling 911 or calling your MD immediately  if symptoms less severe.  You must read complete instructions/literature along with all the possible adverse reactions/side effects for all the Medicines you take and that have been prescribed to you. Take any new Medicines after you have completely understood and accpet all the possible adverse reactions/side effects.   Do not drive when taking Pain medications or sleeping medications (Benzodiazepines)  Do not take more than prescribed Pain, Sleep and Anxiety Medications. It is not advisable to combine anxiety,sleep and pain medications without talking with your primary care practitioner  Special Instructions: If you have smoked or chewed Tobacco  in the last 2 yrs please stop smoking, stop any regular Alcohol  and or any Recreational drug use.  Wear Seat belts while driving.  Do not  drive if taking any narcotic, mind altering or controlled substances or recreational drugs or alcohol.

## 2022-04-12 ENCOUNTER — Ambulatory Visit (INDEPENDENT_AMBULATORY_CARE_PROVIDER_SITE_OTHER): Payer: 59 | Admitting: Orthopedic Surgery

## 2022-04-12 ENCOUNTER — Encounter: Payer: Self-pay | Admitting: Orthopedic Surgery

## 2022-04-12 VITALS — Ht 69.0 in | Wt 147.0 lb

## 2022-04-12 DIAGNOSIS — I808 Phlebitis and thrombophlebitis of other sites: Secondary | ICD-10-CM

## 2022-04-12 NOTE — Progress Notes (Signed)
[facility]thopa[facility] Clinic Return  Assessment: Eric Conley is a 61 y.o. male with the following: Superficial thrombophlebitis of the right forearm   Plan: Mr. Kiernan continues to improve.  The swelling, redness and irritation that was seen while he was admitted has improved.  Nothing further is needed at this time.  If he has any further issues, he can contact the clinic.   Follow-up: Return if symptoms worsen or fail to improve.   Subjective:  Chief Complaint  Patient presents with   Right Forearm - Follow-up    History of Present Illness: Eric Conley is a 61 y.o. male who returns to clinic for repeat evaluation of right forearm swelling.  I saw him as a consult while he was admitted.  He had a large swelling over the volar aspect of the right forearm, which was likely secondary to IV infiltration.  Ultrasound demonstrated superficial thrombophlebitis.  At 1 point, we did schedule surgery, but this was canceled due to the ultrasound findings.  He was subsequently discharged.  Since then, he has continued to improve.  The redness is gotten better.  The swelling is much better.  Occasional swelling throughout bilateral upper extremities.  Some pain in the left wrist.  Review of Systems: No fevers or chills No numbness or tingling No chest pain No shortness of breath No bowel or bladder dysfunction No GI distress No headaches   Objective: Ht 5\' 9"  (1.753 m)   Wt 147 lb (66.7 kg)   BMI 21.71 kg/m   Physical Exam:  Alert and oriented.  No acute distress.  Right forearm with mild diffuse redness.  No induration.  No fluctuance.  The focal swelling over the volar forearm has significantly improved.  Fingers are warm and well-perfused.  2+ radial pulse.   IMAGING: I personally ordered and reviewed the following images:   No new imaging obtained today.  Eric Rasmussen, MD 04/12/2022 2:29 PM

## 2022-04-22 ENCOUNTER — Ambulatory Visit (INDEPENDENT_AMBULATORY_CARE_PROVIDER_SITE_OTHER): Payer: 59 | Admitting: Neurology

## 2022-04-22 ENCOUNTER — Encounter: Payer: Self-pay | Admitting: Neurology

## 2022-04-22 VITALS — BP 139/87 | HR 71 | Ht 69.0 in | Wt 140.0 lb

## 2022-04-22 DIAGNOSIS — Z5181 Encounter for therapeutic drug level monitoring: Secondary | ICD-10-CM

## 2022-04-22 DIAGNOSIS — G40909 Epilepsy, unspecified, not intractable, without status epilepticus: Secondary | ICD-10-CM

## 2022-04-22 DIAGNOSIS — Z789 Other specified health status: Secondary | ICD-10-CM

## 2022-04-22 DIAGNOSIS — S065XAA Traumatic subdural hemorrhage with loss of consciousness status unknown, initial encounter: Secondary | ICD-10-CM | POA: Diagnosis not present

## 2022-04-22 DIAGNOSIS — F1090 Alcohol use, unspecified, uncomplicated: Secondary | ICD-10-CM

## 2022-04-22 MED ORDER — LEVETIRACETAM 500 MG PO TABS: 500.0000 mg | ORAL_TABLET | Freq: Two times a day (BID) | ORAL | 3 refills | Status: DC

## 2022-04-22 NOTE — Patient Instructions (Signed)
Continue with Keppra 500 mg twice daily  Routine EEG  Keppra Level today  Follow up in 6 months or sooner if worse  Letter provided for work

## 2022-04-22 NOTE — Progress Notes (Signed)
[facility]  PATIENT: Eric Conley DOB: [birth year]  REQUESTING CLINICIAN: Cleora Fleet, MD HISTORY FROM: Patient and sister in law  REASON FOR VISIT: Seizure    HISTORICAL  CHIEF COMPLAINT:  Chief Complaint  Patient presents with   New Patient (Initial Visit)    RM 63 with family. ED referral for seizures. No family sx. No further seizures episodes since ED visit. Had alcohol withdraw and feel this caused seizures.  Had subdural hematoma back in August.     HISTORY OF PRESENT ILLNESS:  This is a 61 year old gentleman past medical history of alcohol use disorder, hypertension, fall in August with subdural hematoma who is presenting after a seizure in October.  Patient reports at that time he was complaining of diarrhea for 3 days and increased numbness in bilateral hands and and feet and the next thing that he know he is in the hospital. He reports poor memory of his recent hospitalization but sister-in-law told me that patient was witnessed to have a seizure by EMS, generalized tonic-clonic seizures.  Per chart review he required Versed administration.  He was seen by neurology in the hospital.  Due to his history of hematoma in August, he was recommended to start Keppra 500 mg twice daily.  He started the medication, reports compliance and denies any side effect.  He reported he has cut down his alcohol intake, and is having meeting for anger management.  Denies any previous history of seizures  Handedness: Right handed  Onset: Oct 15  Seizure Type: Generalized convulsion   Current frequency: Only once  Any injuries from seizures: Denies  Seizure risk factors: History of alcohol abuse, subdural hematoma  Previous ASMs: None  Currenty ASMs: Levetiracetam 500 mg twice daily  ASMs side effects: Denies  Brain Images: Subdural hematoma  Previous EEGs: Not available for review   OTHER MEDICAL CONDITIONS: Alcohol abuse, hypertension, subdural  hematoma  REVIEW OF SYSTEMS: Full 14 system review of systems performed and negative with exception of: As noted in the HPI  ALLERGIES: Allergies  Allergen Reactions   Codeine    Poison Sumac Extract     HOME MEDICATIONS: Outpatient Medications Prior to Visit  Medication Sig Dispense Refill   calcium carbonate (TUMS - DOSED IN MG ELEMENTAL CALCIUM) 500 MG chewable tablet Chew 1 tablet (200 mg of elemental calcium total) by mouth 3 (three) times daily as needed for indigestion or heartburn.     diltiazem (CARDIZEM CD) 120 MG 24 hr capsule Take 1 capsule (120 mg total) by mouth daily. 30 capsule 1   folic acid (FOLVITE) 1 MG tablet Take 1 tablet (1 mg total) by mouth daily. 30 tablet 2   magnesium oxide (MAG-OX) 400 (240 Mg) MG tablet Take 1 tablet (400 mg total) by mouth 2 (two) times daily. 60 tablet 1   Metoprolol Tartrate 75 MG TABS Take 75 mg by mouth 2 (two) times daily. 60 tablet 1   omeprazole (PRILOSEC) 20 MG capsule Take 1 capsule (20 mg total) by mouth daily. Alternates with nexium 30 capsule 1   thiamine (VITAMIN B-1) 100 MG tablet Take 1 tablet (100 mg total) by mouth daily. 30 tablet 2   levETIRAcetam (KEPPRA) 500 MG tablet Take 1 tablet (500 mg total) by mouth 2 (two) times daily. 60 tablet 1   chlordiazePOXIDE (LIBRIUM) 5 MG capsule 2 po BID x 3 days, then 1 po BID x 3 days then 1 po daily x 3 days 21 capsule 0  No facility-administered medications prior to visit.    PAST MEDICAL HISTORY: Past Medical History:  Diagnosis Date   Acid reflux    Depression    Hypertension     PAST SURGICAL HISTORY: Past Surgical History:  Procedure Laterality Date   NO PAST SURGERIES      FAMILY HISTORY: History reviewed. No pertinent family history.  SOCIAL HISTORY: Social History   Socioeconomic History   Marital status: Legally Separated    Spouse name: Not on file   Number of children: Not on file   Years of education: Not on file   Highest education level: Not on  file  Occupational History   Not on file  Tobacco Use   Smoking status: Every Day    Packs/day: 0.25    Types: Cigarettes   Smokeless tobacco: Never  Vaping Use   Vaping Use: Never used  Substance and Sexual Activity   Alcohol use: Not Currently    Alcohol/week: 7.0 standard drinks of alcohol    Types: 7 Cans of beer per week    Comment: 1 beer daily, last drink evening of 12/28/21   Drug use: Not Currently   Sexual activity: Not on file  Other Topics Concern   Not on file  Social History Narrative   Right handed   Caffeine use: caffeine pill as needed, energy drinks (1 per day)   Social Determinants of Health   Financial Resource Strain: Not on file  Food Insecurity: No Food Insecurity (03/20/2022)   Hunger Vital Sign    Worried About Running Out of Food in the Last Year: Never true    Ran Out of Food in the Last Year: Never true  Transportation Needs: No Transportation Needs (03/20/2022)   PRAPARE - Hydrologist (Medical): No    Lack of Transportation (Non-Medical): No  Physical Activity: Not on file  Stress: Not on file  Social Connections: Not on file  Intimate Partner Violence: Not At Risk (03/20/2022)   Humiliation, Afraid, Rape, and Kick questionnaire    Fear of Current or Ex-Partner: No    Emotionally Abused: No    Physically Abused: No    Sexually Abused: No    PHYSICAL EXAM  GENERAL EXAM/CONSTITUTIONAL: Vitals:  Vitals:   04/22/22 0758  BP: 139/87  Pulse: 71  Weight: 140 lb (63.5 kg)  Height: 5\' 9"  (1.753 m)   Body mass index is 20.67 kg/m. Wt Readings from Last 3 Encounters:  04/22/22 140 lb (63.5 kg)  04/12/22 147 lb (66.7 kg)  03/31/22 147 lb 11.3 oz (67 kg)   Patient is in no distress; well developed, nourished and groomed; neck is supple  EYES: Visual fields full to confrontation, Extraocular movements intacts,  No results found.  MUSCULOSKELETAL: Gait, strength, tone, movements noted in Neurologic exam  below  NEUROLOGIC: MENTAL STATUS:      No data to display         awake, alert, oriented to person, place and time Having difficulty recalling his last hospitalization Reports 6 quarter in $1.75. Able to spell EMPTY forward but not backward  language fluent, comprehension intact, naming intact fund of knowledge appropriate  CRANIAL NERVE:  2nd, 3rd, 4th, 6th - Visual fields full to confrontation, extraocular muscles intact, no nystagmus 5th - facial sensation symmetric 7th - facial strength symmetric 8th - hearing intact 9th - palate elevates symmetrically, uvula midline 11th - shoulder shrug symmetric 12th - tongue protrusion midline  MOTOR:  normal bulk  and tone, full strength in the BUE, BLE  SENSORY:  normal and symmetric to light touch  COORDINATION:  finger-nose-finger, fine finger movements normal. Mild tremors noted with extension of both arms   REFLEXES:  deep tendon reflexes present and symmetric  GAIT/STATION:  normal     DIAGNOSTIC DATA (LABS, IMAGING, TESTING) - I reviewed patient records, labs, notes, testing and imaging myself where available.  Lab Results  Component Value Date   WBC 3.2 (L) 03/30/2022   HGB 11.1 (L) 03/30/2022   HCT 33.4 (L) 03/30/2022   MCV 98.8 03/30/2022   PLT 358 03/30/2022      Component Value Date/Time   NA 133 (L) 03/30/2022 0426   K 4.0 03/30/2022 0426   CL 100 03/30/2022 0426   CO2 24 03/30/2022 0426   GLUCOSE 108 (H) 03/30/2022 0426   BUN 10 03/30/2022 0426   CREATININE 0.78 03/30/2022 0426   CALCIUM 8.9 03/30/2022 0426   PROT 7.0 03/25/2022 0330   ALBUMIN 2.9 (L) 03/25/2022 0330   AST 23 03/25/2022 0330   ALT 16 03/25/2022 0330   ALKPHOS 94 03/25/2022 0330   BILITOT 1.0 03/25/2022 0330   GFRNONAA >60 03/30/2022 0426   GFRAA >60 12/06/2019 1819   No results found for: "CHOL", "HDL", "LDLCALC", "LDLDIRECT", "TRIG" No results found for: "HGBA1C" Lab Results  Component Value Date   VITAMINB12 652  03/20/2022   Lab Results  Component Value Date   TSH 0.818 03/21/2022    MRI Brain 03/21/22 1. No acute intracranial process. No acute or subacute infarct. No seizure etiology identified. 2. 5 mm residual left parietal subdural hematoma, as seen on the recent CT. 3. Encephalomalacia in the left anteromedial temporal lobe and left inferior frontal lobe, likely the sequela of prior trauma.  I personally reviewed brain Images  ASSESSMENT AND PLAN  61 y.o. year old male  with history of hypertension, alcohol use, subdural hematoma and seizure who is presenting to establish care.  He only had 1 seizure but due to his encephalomalacia in the left anterior medial temporal lobe and left inferior frontal lobe due to prior trauma he was started on Keppra 500 mg twice daily.  He reports compliance with the medication, denies any additional seizures.  At the moment, I will check a Keppra level, routine EEG and I will keep the patient on Keppra for at least a year.  He is comfortable with plan.  Follow-up in 6 months or sooner if worse   1. Seizure disorder ([place])   2. SDH (subdural hematoma) ([facility])   3. Alcohol use     Patient Instructions  Continue with Keppra 500 mg twice daily  Routine EEG  Keppra Level today  Follow up in 6 months or sooner if worse  Letter provided for work    Per [employer], patients with seizures are not allowed to drive until they have been seizure-free for six months.  Other recommendations include using caution when using heavy equipment or power tools. Avoid working on ladders or at heights. Take showers instead of baths.  Do not swim alone.  Ensure the water temperature is not too high on the home water heater. Do not go swimming alone. Do not lock yourself in a room alone (i.e. bathroom). When caring for infants or small children, sit down when holding, feeding, or changing them to minimize risk of injury to the child in the event you have a seizure.  Maintain good sleep hygiene. Avoid alcohol.  Also recommend adequate sleep, hydration, good diet and minimize stress.   During the Seizure  - First, ensure adequate ventilation and place patients on the floor on their left side  Loosen clothing around the neck and ensure the airway is patent. If the patient is clenching the teeth, do not force the mouth open with any object as this can cause severe damage - Remove all items from the surrounding that can be hazardous. The patient may be oblivious to what's happening and may not even know what he or she is doing. If the patient is confused and wandering, either gently guide him/her away and block access to outside areas - Reassure the individual and be comforting - Call 911. In most cases, the seizure ends before EMS arrives. However, there are cases when seizures may last over 3 to 5 minutes. Or the individual may have developed breathing difficulties or severe injuries. If a pregnant patient or a person with diabetes develops a seizure, it is prudent to call an ambulance. - Finally, if the patient does not regain full consciousness, then call EMS. Most patients will remain confused for about 45 to 90 minutes after a seizure, so you must use judgment in calling for help. - Avoid restraints but make sure the patient is in a bed with padded side rails - Place the individual in a lateral position with the neck slightly flexed; this will help the saliva drain from the mouth and prevent the tongue from falling backward - Remove all nearby furniture and other hazards from the area - Provide verbal assurance as the individual is regaining consciousness - Provide the patient with privacy if possible - Call for help and start treatment as ordered by the caregiver   After the Seizure (Postictal Stage)  After a seizure, most patients experience confusion, fatigue, muscle pain and/or a headache. Thus, one should permit the individual to sleep. For the next  few days, reassurance is essential. Being calm and helping reorient the person is also of importance.  Most seizures are painless and end spontaneously. Seizures are not harmful to others but can lead to complications such as stress on the lungs, brain and the heart. Individuals with prior lung problems may develop labored breathing and respiratory distress.     Orders Placed This Encounter  Procedures   Levetiracetam level   EEG adult    Meds ordered this encounter  Medications   levETIRAcetam (KEPPRA) 500 MG tablet    Sig: Take 1 tablet (500 mg total) by mouth 2 (two) times daily.    Dispense:  180 tablet    Refill:  3    Return in about 6 months (around 10/21/2022).    Windell Norfolk, MD 04/22/2022, 8:37 AM  [facility] [address], [address] [place], [place] [address] [phone]

## 2022-04-25 ENCOUNTER — Ambulatory Visit (INDEPENDENT_AMBULATORY_CARE_PROVIDER_SITE_OTHER): Payer: 59 | Admitting: Gastroenterology

## 2022-04-25 LAB — LEVETIRACETAM LEVEL: Levetiracetam Lvl: 10.3 ug/mL (ref 10.0–40.0)

## 2022-05-04 ENCOUNTER — Other Ambulatory Visit: Payer: 59 | Admitting: *Deleted

## 2022-05-04 ENCOUNTER — Telehealth: Payer: Self-pay | Admitting: Neurology

## 2022-05-04 NOTE — Telephone Encounter (Signed)
Pt said employer did not have FMLA paperwork.  Would like a call back on what to do about the FMLA papework.Please call [phone]

## 2022-05-05 NOTE — Telephone Encounter (Signed)
Noted thank you

## 2022-05-25 ENCOUNTER — Emergency Department (HOSPITAL_COMMUNITY): Payer: 59

## 2022-05-25 ENCOUNTER — Inpatient Hospital Stay (HOSPITAL_COMMUNITY)
Admission: EM | Admit: 2022-05-25 | Discharge: 2022-06-02 | DRG: 101 | Disposition: A | Discharge status: Home Health | Payer: 59 | Attending: Internal Medicine | Admitting: Internal Medicine

## 2022-05-25 DIAGNOSIS — R509 Fever, unspecified: Secondary | ICD-10-CM | POA: Diagnosis not present

## 2022-05-25 DIAGNOSIS — E871 Hypo-osmolality and hyponatremia: Secondary | ICD-10-CM | POA: Diagnosis not present

## 2022-05-25 DIAGNOSIS — R569 Unspecified convulsions: Secondary | ICD-10-CM

## 2022-05-25 DIAGNOSIS — G40909 Epilepsy, unspecified, not intractable, without status epilepticus: Principal | ICD-10-CM | POA: Diagnosis present

## 2022-05-25 DIAGNOSIS — R7401 Elevation of levels of liver transaminase levels: Secondary | ICD-10-CM | POA: Diagnosis present

## 2022-05-25 DIAGNOSIS — R000 Tachycardia, unspecified: Secondary | ICD-10-CM | POA: Diagnosis present

## 2022-05-25 DIAGNOSIS — G312 Degeneration of nervous system due to alcohol: Secondary | ICD-10-CM | POA: Diagnosis not present

## 2022-05-25 DIAGNOSIS — Y900 Blood alcohol level of less than 20 mg/100 ml: Secondary | ICD-10-CM | POA: Diagnosis present

## 2022-05-25 DIAGNOSIS — K219 Gastro-esophageal reflux disease without esophagitis: Secondary | ICD-10-CM | POA: Diagnosis present

## 2022-05-25 DIAGNOSIS — F32A Depression, unspecified: Secondary | ICD-10-CM | POA: Diagnosis present

## 2022-05-25 DIAGNOSIS — Z8679 Personal history of other diseases of the circulatory system: Secondary | ICD-10-CM

## 2022-05-25 DIAGNOSIS — R4701 Aphasia: Secondary | ICD-10-CM | POA: Diagnosis not present

## 2022-05-25 DIAGNOSIS — E8721 Acute metabolic acidosis: Secondary | ICD-10-CM | POA: Diagnosis not present

## 2022-05-25 DIAGNOSIS — Z885 Allergy status to narcotic agent status: Secondary | ICD-10-CM

## 2022-05-25 DIAGNOSIS — I10 Essential (primary) hypertension: Secondary | ICD-10-CM | POA: Diagnosis present

## 2022-05-25 DIAGNOSIS — I82611 Acute embolism and thrombosis of superficial veins of right upper extremity: Secondary | ICD-10-CM | POA: Diagnosis present

## 2022-05-25 DIAGNOSIS — E876 Hypokalemia: Secondary | ICD-10-CM | POA: Diagnosis not present

## 2022-05-25 DIAGNOSIS — F10239 Alcohol dependence with withdrawal, unspecified: Secondary | ICD-10-CM | POA: Diagnosis not present

## 2022-05-25 DIAGNOSIS — F17210 Nicotine dependence, cigarettes, uncomplicated: Secondary | ICD-10-CM | POA: Diagnosis present

## 2022-05-25 DIAGNOSIS — Z79899 Other long term (current) drug therapy: Secondary | ICD-10-CM

## 2022-05-25 DIAGNOSIS — Z888 Allergy status to other drugs, medicaments and biological substances status: Secondary | ICD-10-CM

## 2022-05-25 DIAGNOSIS — A4101 Sepsis due to Methicillin susceptible Staphylococcus aureus: Principal | ICD-10-CM | POA: Diagnosis present

## 2022-05-25 DIAGNOSIS — Z635 Disruption of family by separation and divorce: Secondary | ICD-10-CM

## 2022-05-25 DIAGNOSIS — F1010 Alcohol abuse, uncomplicated: Secondary | ICD-10-CM | POA: Diagnosis present

## 2022-05-25 DIAGNOSIS — B9561 Methicillin susceptible Staphylococcus aureus infection as the cause of diseases classified elsewhere: Secondary | ICD-10-CM | POA: Diagnosis not present

## 2022-05-25 DIAGNOSIS — D638 Anemia in other chronic diseases classified elsewhere: Secondary | ICD-10-CM | POA: Diagnosis present

## 2022-05-25 DIAGNOSIS — R7881 Bacteremia: Secondary | ICD-10-CM | POA: Diagnosis not present

## 2022-05-25 HISTORY — DX: Other psychoactive substance abuse, uncomplicated: F19.10

## 2022-05-25 HISTORY — DX: Traumatic subdural hemorrhage with loss of consciousness status unknown, initial encounter: S06.5XAA

## 2022-05-25 HISTORY — DX: Unspecified convulsions: R56.9

## 2022-05-25 LAB — CBC WITH DIFFERENTIAL/PLATELET
Abs Immature Granulocytes: 0.04 10*3/uL (ref 0.00–0.07)
Basophils Absolute: 0 10*3/uL (ref 0.0–0.1)
Basophils Relative: 1 %
Eosinophils Absolute: 0.1 10*3/uL (ref 0.0–0.5)
Eosinophils Relative: 2 %
HCT: 39.9 % (ref 39.0–52.0)
Hemoglobin: 13.2 g/dL (ref 13.0–17.0)
Immature Granulocytes: 1 %
Lymphocytes Relative: 29 %
Lymphs Abs: 1.7 10*3/uL (ref 0.7–4.0)
MCH: 30.9 pg (ref 26.0–34.0)
MCHC: 33.1 g/dL (ref 30.0–36.0)
MCV: 93.4 fL (ref 80.0–100.0)
Monocytes Absolute: 0.6 10*3/uL (ref 0.1–1.0)
Monocytes Relative: 11 %
Neutro Abs: 3.4 10*3/uL (ref 1.7–7.7)
Neutrophils Relative %: 56 %
Platelets: 256 10*3/uL (ref 150–400)
RBC: 4.27 MIL/uL (ref 4.22–5.81)
RDW: 16 % — ABNORMAL HIGH (ref 11.5–15.5)
WBC: 5.9 10*3/uL (ref 4.0–10.5)
nRBC: 0 % (ref 0.0–0.2)

## 2022-05-25 LAB — PROTIME-INR
INR: 1 (ref 0.8–1.2)
Prothrombin Time: 13.4 seconds (ref 11.4–15.2)

## 2022-05-25 LAB — COMPREHENSIVE METABOLIC PANEL
ALT: 20 U/L (ref 0–44)
AST: 32 U/L (ref 15–41)
Albumin: 4 g/dL (ref 3.5–5.0)
Alkaline Phosphatase: 71 U/L (ref 38–126)
Anion gap: 23 — ABNORMAL HIGH (ref 5–15)
BUN: 8 mg/dL (ref 6–20)
CO2: 15 mmol/L — ABNORMAL LOW (ref 22–32)
Calcium: 9.5 mg/dL (ref 8.9–10.3)
Chloride: 103 mmol/L (ref 98–111)
Creatinine, Ser: 1.3 mg/dL — ABNORMAL HIGH (ref 0.61–1.24)
GFR, Estimated: >60 mL/min (ref >60)
Glucose, Bld: 119 mg/dL — ABNORMAL HIGH (ref 70–99)
Potassium: 3.8 mmol/L (ref 3.5–5.1)
Sodium: 141 mmol/L (ref 135–145)
Total Bilirubin: 0.5 mg/dL (ref 0.3–1.2)
Total Protein: 7.4 g/dL (ref 6.5–8.1)

## 2022-05-25 LAB — ETHANOL: Alcohol, Ethyl (B): <10 mg/dL (ref <10)

## 2022-05-25 LAB — MAGNESIUM: Magnesium: 2.2 mg/dL (ref 1.7–2.4)

## 2022-05-25 LAB — CBG MONITORING, ED: Glucose-Capillary: 126 mg/dL — ABNORMAL HIGH (ref 70–99)

## 2022-05-25 MED ORDER — LORAZEPAM 2 MG/ML IJ SOLN
1.0000 mg | INTRAMUSCULAR | Status: AC | PRN
  Administered 2022-05-26 – 2022-05-28 (×2): 2 mg via INTRAVENOUS
  Filled 2022-05-25 (×2): qty 1

## 2022-05-25 MED ORDER — FOLIC ACID 1 MG PO TABS
1.0000 mg | ORAL_TABLET | Freq: Every day | ORAL | Status: DC
  Administered 2022-05-25 – 2022-06-02 (×9): 1 mg via ORAL
  Filled 2022-05-25 (×9): qty 1

## 2022-05-25 MED ORDER — ADULT MULTIVITAMIN W/MINERALS CH
1.0000 | ORAL_TABLET | Freq: Every day | ORAL | Status: DC
  Administered 2022-05-25 – 2022-06-02 (×9): 1 via ORAL
  Filled 2022-05-25 (×9): qty 1

## 2022-05-25 MED ORDER — LEVETIRACETAM IN NACL 1500 MG/100ML IV SOLN: 1500.0000 mg | Freq: Once | INTRAVENOUS | Status: DC

## 2022-05-25 MED ORDER — THIAMINE HCL 100 MG/ML IJ SOLN
100.0000 mg | Freq: Every day | INTRAMUSCULAR | Status: DC
  Filled 2022-05-25 (×5): qty 2

## 2022-05-25 MED ORDER — LEVETIRACETAM IN NACL 1500 MG/100ML IV SOLN
1500.0000 mg | INTRAVENOUS | Status: AC
  Administered 2022-05-25: 1500 mg via INTRAVENOUS
  Filled 2022-05-25 (×2): qty 100

## 2022-05-25 MED ORDER — THIAMINE MONONITRATE 100 MG PO TABS
100.0000 mg | ORAL_TABLET | Freq: Every day | ORAL | Status: DC
  Administered 2022-05-25 – 2022-06-02 (×9): 100 mg via ORAL
  Filled 2022-05-25 (×9): qty 1

## 2022-05-25 MED ORDER — LEVETIRACETAM IN NACL 500 MG/100ML IV SOLN
500.0000 mg | Freq: Once | INTRAVENOUS | Status: AC
  Administered 2022-05-25: 500 mg via INTRAVENOUS
  Filled 2022-05-25: qty 100

## 2022-05-25 MED ORDER — LORAZEPAM 1 MG PO TABS: 1.0000 mg | ORAL_TABLET | ORAL | Status: AC | PRN

## 2022-05-25 MED ORDER — LORAZEPAM 2 MG/ML IJ SOLN
1.0000 mg | Freq: Once | INTRAMUSCULAR | Status: AC
  Administered 2022-05-25: 1 mg via INTRAVENOUS
  Filled 2022-05-25: qty 1

## 2022-05-25 MED ORDER — LEVETIRACETAM IN NACL 1000 MG/100ML IV SOLN
1000.0000 mg | Freq: Once | INTRAVENOUS | Status: AC
  Administered 2022-05-25: 1000 mg via INTRAVENOUS
  Filled 2022-05-25: qty 100

## 2022-05-25 NOTE — ED Notes (Signed)
Pt transported to ct at this time 

## 2022-05-25 NOTE — ED Notes (Signed)
At bedside to administer po medication. At this time pt will open his eyes to verbal however he will not speak or answer questions. ED provider made aware and  he and neuro at bedside with pt at this time

## 2022-05-25 NOTE — ED Notes (Signed)
Placed a bed alarm at this time

## 2022-05-25 NOTE — ED Notes (Signed)
Walked by room and found pt sitting at the foot of the bed. Pt was educated and adjusted back into bed. ED provider at bedside at this time.

## 2022-05-25 NOTE — ED Notes (Signed)
ED provider at bedside. C-collar removed at this time

## 2022-05-25 NOTE — ED Notes (Signed)
Contacted pharmacy and requested medication to be sent up

## 2022-05-25 NOTE — Consult Note (Signed)
Neurology Consultation Reason for Consult: Seizure Referring Physician: Sharlett Conley, R  CC: Seizure  History is obtained from: Chart review  HPI: Eric Conley is a 61 y.o. male who was in his normal state of health earlier when he was seen to have seizure-like activity.  He has a history of alcohol abuse, traumatic subdural, seizures and was seen to have seizure at home.  In transit, he was seen to have another generalized tonic-clonic seizure with urinary incontinence and was given 2.5 mg of Versed.  He then had eye movements that made the EMS transporter so suspect he was still having ongoing seizure activity and she gave him an additional 2.5.  Following this, he has begun responding to pain, and gradually improving.   Past Medical History:  Diagnosis Date   Acid reflux    Depression    Hypertension      No family history on file.   Social History:  reports that he has been smoking cigarettes. He has been smoking an average of .25 packs per day. He has never used smokeless tobacco. He reports that he does not currently use alcohol after a past usage of about 7.0 standard drinks of alcohol per week. He reports that he does not currently use drugs.   Exam: Current vital signs: There were no vitals taken for this visit. Vital signs in last 24 hours: BP: ()/()  Arterial Line BP: ()/()    Physical Exam   Neuro: Mental Status: He is lethargic, but does open eyes and regard me when I call his name, that he quickly closes them again and continues thrashing.  He does not follow commands. Cranial Nerves: II:  Pupils are equal, round, and reactive to light.  III,IV, VI: Eyes are midline, he does not voluntarily cross midline to either direction and unable to check doll's eye due to c-collar in place. V: VII: Facial movement is grossly symmetric given the limitations of the c-collar, he blinks to eyelid stimulation bilaterally Motor: He localizes to noxious stimulation bilaterally,  withdraws bilateral lower extremities Sensory: As above  Cerebellar: Does not perform  On subsequent exam, he had improved to being able to follow commands with showing thumbs bilaterally.    I have reviewed labs in epic and the results pertinent to this consultation are: Sodium 141 Calcium 9.5 Magnesium 2.2 Creatinine 1.3 (eGFR greater than 60)  I have reviewed the images obtained: CT head-negative  Impression: 61 year old male with a history of seizures who presents with seizure flurry of unclear etiology.  Apparently has been taking his medications as prescribed and family has ensured that this is the case.  He does have a history of alcohol abuse, occasionally drinks but per family he is not drinking very much since staying with family since August.  Following my initial examination, he had a repeat episode of aphasia and stopped localizing that he was moving bilateral upper extremities.  He did fixate and track, but would not speak.  My suspicion is he had another unwitnessed seizure.  Recommendations: 1) Keppra 1.5 g given, repeat 1.5 g then increase daily dose to 1 g BID 2) EEG 3) UDS 4) agree wioth CIWA 5) Neurology will follow.    Roland Rack, MD [facility] [phone]  If 7pm- 7am, please page neurology on call as listed in [place].

## 2022-05-25 NOTE — ED Triage Notes (Signed)
BIB [facility] for seizures from home. Staying with family going through ETOH rehab. Pt had a subdural bleed in Aug 2023 had first seizure in Oct 2023. Seizure at home unwitnessed. Pt was found on the floor by family. On EMS arrival pt was alert to person and ambulatory. While en route pt had a grand mal lasted approximately 4 min then went into tonic clonic seizure. Pt received a total of Versed 5mg  last dose at 2030. NPA in rt nare, c-collar in place PIV 18ga Lot ac, NSR on monitor, CBG 131.

## 2022-05-25 NOTE — Progress Notes (Signed)
TOC CSW went to evaluate pt.  CSW will try at a later time.  Pt is currently not appropriate to do Cage Aid assessment.  Neila Teem Tarpley-Carter, MSW, LCSW-A Pronouns:  She/Her/Hers [facility] HealthTransitions of Care Clinical Social Worker Direct Number:  [phone] [email or web address]

## 2022-05-25 NOTE — ED Provider Notes (Signed)
[facility] EMERGENCY DEPARTMENT Provider Note   CSN: [id number] Arrival date & time: 05/25/22  2052     History {Add pertinent medical, surgical, social history, OB history to HPI:1} Chief Complaint  Patient presents with   Seizures    Eric Conley is a 61 y.o. male.  61 year old male with a history of alcohol abuse, traumatic subdural hematoma, and seizures on Keppra who presents emergency department with seizure.  Patient's family called 911 after seizure-like activity.  When EMS arrived patient was alert and oriented to self and was able to ambulate into the ambulance.  He then had a 4-minute generalized tonic-clonic seizure with urinary incontinence.  Was given 5 mg of intramuscular Versed to abort the seizure.  Reportedly was having sonorous respirations and so nasal airway was placed along with a nonrebreather and he was brought to the emergency department for evaluation.  C-collar was placed due to concerns for possible unwitnessed fall.  Fingerstick glucose was 131.  Patient states with his sister-in-law who ensures that he gets his Keppra as prescribed and also reports that the patient is not allowed to drink at her house.       Home Medications Prior to Admission medications   Medication Sig Start Date End Date Taking? Authorizing Provider  calcium carbonate (TUMS - DOSED IN MG ELEMENTAL CALCIUM) 500 MG chewable tablet Chew 1 tablet (200 mg of elemental calcium total) by mouth 3 (three) times daily as needed for indigestion or heartburn. 03/31/22   Johnson, Clanford L, MD  diltiazem (CARDIZEM CD) 120 MG 24 hr capsule Take 1 capsule (120 mg total) by mouth daily. 03/27/22   Catarina Hartshorn, MD  folic acid (FOLVITE) 1 MG tablet Take 1 tablet (1 mg total) by mouth daily. 04/01/22   Johnson, Clanford L, MD  levETIRAcetam (KEPPRA) 500 MG tablet Take 1 tablet (500 mg total) by mouth 2 (two) times daily. 04/22/22 04/17/23  Windell Norfolk, MD  magnesium oxide (MAG-OX)  400 (240 Mg) MG tablet Take 1 tablet (400 mg total) by mouth 2 (two) times daily. 03/27/22   Catarina Hartshorn, MD  Metoprolol Tartrate 75 MG TABS Take 75 mg by mouth 2 (two) times daily. 03/27/22   Catarina Hartshorn, MD  omeprazole (PRILOSEC) 20 MG capsule Take 1 capsule (20 mg total) by mouth daily. Alternates with nexium 03/31/22   Johnson, Clanford L, MD  thiamine (VITAMIN B-1) 100 MG tablet Take 1 tablet (100 mg total) by mouth daily. 04/01/22   Cleora Fleet, MD      Allergies    Codeine and Poison sumac extract    Review of Systems   Review of Systems  Physical Exam Updated Vital Signs There were no vitals taken for this visit. Physical Exam Vitals and nursing note reviewed.  Constitutional:      General: He is in acute distress.     Appearance: He is well-developed. He is ill-appearing.     Comments: Drowsy.  Agitated.  Not following commands.  Localizes to pain.  Not opening eyes to voice.  HENT:     Head: Normocephalic and atraumatic.     Right Ear: External ear normal.     Left Ear: External ear normal.     Nose: Nose normal.  Eyes:     Conjunctiva/sclera: Conjunctivae normal.     Pupils: Pupils are equal, round, and reactive to light.     Comments: Pupils 4 mm and reactive bilaterally.  No eye deviation  Neck:  Comments: C-collar in place Cardiovascular:     Rate and Rhythm: Normal rate and regular rhythm.     Heart sounds: Normal heart sounds.  Pulmonary:     Effort: Pulmonary effort is normal. No respiratory distress.     Breath sounds: Normal breath sounds.  Abdominal:     General: There is no distension.     Palpations: Abdomen is soft. There is no mass.     Tenderness: There is no abdominal tenderness. There is no guarding.  Musculoskeletal:     Right lower leg: No edema.     Left lower leg: No edema.  Skin:    General: Skin is warm and dry.     Findings: Rash (Over lower abdomen) present.  Neurological:     Comments: Moving all 4 extremities equally      ED Results / Procedures / Treatments   Labs (all labs ordered are listed, but only abnormal results are displayed) Labs Reviewed  COMPREHENSIVE METABOLIC PANEL  CBC WITH DIFFERENTIAL/PLATELET  MAGNESIUM  URINALYSIS, ROUTINE W REFLEX MICROSCOPIC  RAPID URINE DRUG SCREEN, HOSP PERFORMED  ETHANOL  CBG MONITORING, ED    EKG None  Radiology No results found.  Procedures Procedures  {Document cardiac monitor, telemetry assessment procedure when appropriate:1}  Medications Ordered in ED Medications  levETIRAcetam (KEPPRA) IVPB 1500 mg/ 100 mL premix (has no administration in time range)    ED Course/ Medical Decision Making/ A&P                           Medical Decision Making Amount and/or Complexity of Data Reviewed Labs: ordered. Radiology: ordered.  Risk Prescription drug management.   ***  {Document critical care time when appropriate:1} {Document review of labs and clinical decision tools ie heart score, Chads2Vasc2 etc:1}  {Document your independent review of radiology images, and any outside records:1} {Document your discussion with family members, caretakers, and with consultants:1} {Document social determinants of health affecting pt's care:1} {Document your decision making why or why not admission, treatments were needed:1} Final Clinical Impression(s) / ED Diagnoses Final diagnoses:  None    Rx / DC Orders ED Discharge Orders     None

## 2022-05-25 NOTE — ED Notes (Signed)
Pt is responsive to verbal and able to answer name and DOB, however unable to answer any other questions appropriately

## 2022-05-25 NOTE — ED Notes (Signed)
Pt at bridge and provider is at bedside at this time

## 2022-05-26 ENCOUNTER — Other Ambulatory Visit: Payer: Self-pay

## 2022-05-26 ENCOUNTER — Observation Stay (HOSPITAL_COMMUNITY): Payer: 59

## 2022-05-26 DIAGNOSIS — D638 Anemia in other chronic diseases classified elsewhere: Secondary | ICD-10-CM | POA: Diagnosis present

## 2022-05-26 DIAGNOSIS — E8721 Acute metabolic acidosis: Secondary | ICD-10-CM | POA: Diagnosis not present

## 2022-05-26 DIAGNOSIS — R569 Unspecified convulsions: Secondary | ICD-10-CM

## 2022-05-26 DIAGNOSIS — R4701 Aphasia: Secondary | ICD-10-CM | POA: Diagnosis not present

## 2022-05-26 DIAGNOSIS — R509 Fever, unspecified: Secondary | ICD-10-CM | POA: Diagnosis not present

## 2022-05-26 DIAGNOSIS — R52 Pain, unspecified: Secondary | ICD-10-CM | POA: Diagnosis not present

## 2022-05-26 DIAGNOSIS — Y900 Blood alcohol level of less than 20 mg/100 ml: Secondary | ICD-10-CM | POA: Diagnosis present

## 2022-05-26 DIAGNOSIS — R7881 Bacteremia: Secondary | ICD-10-CM | POA: Diagnosis not present

## 2022-05-26 DIAGNOSIS — I10 Essential (primary) hypertension: Secondary | ICD-10-CM

## 2022-05-26 DIAGNOSIS — Z8679 Personal history of other diseases of the circulatory system: Secondary | ICD-10-CM | POA: Diagnosis not present

## 2022-05-26 DIAGNOSIS — G312 Degeneration of nervous system due to alcohol: Secondary | ICD-10-CM | POA: Diagnosis not present

## 2022-05-26 DIAGNOSIS — Z79899 Other long term (current) drug therapy: Secondary | ICD-10-CM | POA: Diagnosis not present

## 2022-05-26 DIAGNOSIS — F17210 Nicotine dependence, cigarettes, uncomplicated: Secondary | ICD-10-CM | POA: Diagnosis present

## 2022-05-26 DIAGNOSIS — M7989 Other specified soft tissue disorders: Secondary | ICD-10-CM | POA: Diagnosis not present

## 2022-05-26 DIAGNOSIS — Z888 Allergy status to other drugs, medicaments and biological substances status: Secondary | ICD-10-CM | POA: Diagnosis not present

## 2022-05-26 DIAGNOSIS — E876 Hypokalemia: Secondary | ICD-10-CM | POA: Diagnosis not present

## 2022-05-26 DIAGNOSIS — Z635 Disruption of family by separation and divorce: Secondary | ICD-10-CM | POA: Diagnosis not present

## 2022-05-26 DIAGNOSIS — Z885 Allergy status to narcotic agent status: Secondary | ICD-10-CM | POA: Diagnosis not present

## 2022-05-26 DIAGNOSIS — F10239 Alcohol dependence with withdrawal, unspecified: Secondary | ICD-10-CM | POA: Diagnosis not present

## 2022-05-26 DIAGNOSIS — E871 Hypo-osmolality and hyponatremia: Secondary | ICD-10-CM | POA: Diagnosis not present

## 2022-05-26 DIAGNOSIS — R7401 Elevation of levels of liver transaminase levels: Secondary | ICD-10-CM | POA: Diagnosis present

## 2022-05-26 DIAGNOSIS — F1010 Alcohol abuse, uncomplicated: Secondary | ICD-10-CM

## 2022-05-26 DIAGNOSIS — G9341 Metabolic encephalopathy: Secondary | ICD-10-CM | POA: Diagnosis not present

## 2022-05-26 DIAGNOSIS — G40909 Epilepsy, unspecified, not intractable, without status epilepticus: Secondary | ICD-10-CM | POA: Diagnosis present

## 2022-05-26 DIAGNOSIS — I82611 Acute embolism and thrombosis of superficial veins of right upper extremity: Secondary | ICD-10-CM | POA: Diagnosis present

## 2022-05-26 DIAGNOSIS — R000 Tachycardia, unspecified: Secondary | ICD-10-CM | POA: Diagnosis present

## 2022-05-26 DIAGNOSIS — B9561 Methicillin susceptible Staphylococcus aureus infection as the cause of diseases classified elsewhere: Secondary | ICD-10-CM | POA: Diagnosis not present

## 2022-05-26 DIAGNOSIS — K219 Gastro-esophageal reflux disease without esophagitis: Secondary | ICD-10-CM | POA: Diagnosis present

## 2022-05-26 DIAGNOSIS — F32A Depression, unspecified: Secondary | ICD-10-CM | POA: Diagnosis present

## 2022-05-26 LAB — RAPID URINE DRUG SCREEN, HOSP PERFORMED
Amphetamines: NOT DETECTED
Barbiturates: NOT DETECTED
Benzodiazepines: POSITIVE — AB
Cocaine: NOT DETECTED
Opiates: NOT DETECTED
Tetrahydrocannabinol: NOT DETECTED

## 2022-05-26 LAB — BASIC METABOLIC PANEL
Anion gap: 10 (ref 5–15)
BUN: 7 mg/dL (ref 6–20)
CO2: 24 mmol/L (ref 22–32)
Calcium: 8.4 mg/dL — ABNORMAL LOW (ref 8.9–10.3)
Chloride: 105 mmol/L (ref 98–111)
Creatinine, Ser: 0.89 mg/dL (ref 0.61–1.24)
GFR, Estimated: >60 mL/min (ref >60)
Glucose, Bld: 97 mg/dL (ref 70–99)
Potassium: 3.5 mmol/L (ref 3.5–5.1)
Sodium: 139 mmol/L (ref 135–145)

## 2022-05-26 LAB — CBC
HCT: 34 % — ABNORMAL LOW (ref 39.0–52.0)
Hemoglobin: 11.5 g/dL — ABNORMAL LOW (ref 13.0–17.0)
MCH: 30.8 pg (ref 26.0–34.0)
MCHC: 33.8 g/dL (ref 30.0–36.0)
MCV: 91.2 fL (ref 80.0–100.0)
Platelets: 224 10*3/uL (ref 150–400)
RBC: 3.73 MIL/uL — ABNORMAL LOW (ref 4.22–5.81)
RDW: 16 % — ABNORMAL HIGH (ref 11.5–15.5)
WBC: 7.1 10*3/uL (ref 4.0–10.5)
nRBC: 0 % (ref 0.0–0.2)

## 2022-05-26 LAB — URINALYSIS, ROUTINE W REFLEX MICROSCOPIC
Bilirubin Urine: NEGATIVE
Glucose, UA: NEGATIVE mg/dL
Hgb urine dipstick: NEGATIVE
Ketones, ur: NEGATIVE mg/dL
Leukocytes,Ua: NEGATIVE
Nitrite: NEGATIVE
Protein, ur: NEGATIVE mg/dL
Specific Gravity, Urine: 1.011 (ref 1.005–1.030)
pH: 6 (ref 5.0–8.0)

## 2022-05-26 MED ORDER — ONDANSETRON HCL 4 MG PO TABS: 4.0000 mg | ORAL_TABLET | Freq: Four times a day (QID) | ORAL | Status: DC | PRN

## 2022-05-26 MED ORDER — VALPROATE SODIUM 100 MG/ML IV SOLN
1500.0000 mg | Freq: Once | INTRAVENOUS | Status: AC
  Administered 2022-05-26: 1500 mg via INTRAVENOUS
  Filled 2022-05-26: qty 15

## 2022-05-26 MED ORDER — ENOXAPARIN SODIUM 40 MG/0.4ML IJ SOSY
40.0000 mg | PREFILLED_SYRINGE | INTRAMUSCULAR | Status: DC
  Administered 2022-05-26 – 2022-06-02 (×7): 40 mg via SUBCUTANEOUS
  Filled 2022-05-26 (×7): qty 0.4

## 2022-05-26 MED ORDER — LORAZEPAM 2 MG/ML IJ SOLN
1.0000 mg | Freq: Once | INTRAMUSCULAR | Status: AC
  Administered 2022-05-26: 1 mg via INTRAVENOUS

## 2022-05-26 MED ORDER — LEVETIRACETAM IN NACL 1500 MG/100ML IV SOLN
1500.0000 mg | Freq: Two times a day (BID) | INTRAVENOUS | Status: DC
  Administered 2022-05-26 – 2022-05-30 (×8): 1500 mg via INTRAVENOUS
  Filled 2022-05-26 (×9): qty 100

## 2022-05-26 MED ORDER — SODIUM CHLORIDE 0.9 % IV SOLN
200.0000 mg | Freq: Once | INTRAVENOUS | Status: AC
  Administered 2022-05-26: 200 mg via INTRAVENOUS
  Filled 2022-05-26: qty 20

## 2022-05-26 MED ORDER — SODIUM CHLORIDE 0.9 % IV SOLN
100.0000 mg | Freq: Two times a day (BID) | INTRAVENOUS | Status: DC
  Administered 2022-05-26 – 2022-05-30 (×9): 100 mg via INTRAVENOUS
  Filled 2022-05-26 (×10): qty 10

## 2022-05-26 MED ORDER — LEVETIRACETAM IN NACL 1000 MG/100ML IV SOLN
1000.0000 mg | Freq: Two times a day (BID) | INTRAVENOUS | Status: DC
  Administered 2022-05-26: 1000 mg via INTRAVENOUS
  Filled 2022-05-26: qty 100

## 2022-05-26 MED ORDER — LORAZEPAM 2 MG/ML IJ SOLN
1.0000 mg | Freq: Once | INTRAMUSCULAR | Status: DC
  Filled 2022-05-26: qty 1

## 2022-05-26 MED ORDER — ACETAMINOPHEN 325 MG PO TABS
650.0000 mg | ORAL_TABLET | Freq: Four times a day (QID) | ORAL | Status: DC | PRN
  Administered 2022-05-28 – 2022-05-29 (×3): 650 mg via ORAL
  Filled 2022-05-26 (×4): qty 2

## 2022-05-26 MED ORDER — ACETAMINOPHEN 650 MG RE SUPP: 650.0000 mg | Freq: Four times a day (QID) | RECTAL | Status: DC | PRN

## 2022-05-26 MED ORDER — ONDANSETRON HCL 4 MG/2ML IJ SOLN: 4.0000 mg | Freq: Four times a day (QID) | INTRAMUSCULAR | Status: DC | PRN

## 2022-05-26 NOTE — H&P (Signed)
History and Physical    Patient: Eric Conley DOB: March 25, 1961 DOA: 05/25/2022 DOS: the patient was seen and examined on 05/26/2022 PCP: Assunta Found, MD  Patient coming from: Home  Chief Complaint:  Chief Complaint  Patient presents with   Seizures   HPI: Eric Conley is a 61 y.o. male with medical history significant of EtOH abuse in past, SDH in Aug.  Seizure disorder, HTN.  Pt living with family since SDH in Aug.  Apparently has been taking his medications as prescribed and family has ensured that this is the case. He does have a history of alcohol abuse, occasionally drinks but per family he is not drinking very much since staying with family since August.  Pt in to ED with sudden onset of seizure-like activity.  Generalized tonic-clonic with urinary incontinence.  Recurrent seizure en-route with EMS.  3rd seizure in ED seen by EDP and neurologist.  Review of Systems: unable to review all systems due to the inability of the patient to answer questions. Past Medical History:  Diagnosis Date   Acid reflux    Depression    Hypertension    Past Surgical History:  Procedure Laterality Date   NO PAST SURGERIES     Social History:  reports that he has been smoking cigarettes. He has been smoking an average of .25 packs per day. He has never used smokeless tobacco. He reports that he does not currently use alcohol after a past usage of about 7.0 standard drinks of alcohol per week. He reports that he does not currently use drugs.  Allergies  Allergen Reactions   Codeine    Poison Sumac Extract     No family history on file.  Prior to Admission medications   Medication Sig Start Date End Date Taking? Authorizing Provider  calcium carbonate (TUMS - DOSED IN MG ELEMENTAL CALCIUM) 500 MG chewable tablet Chew 1 tablet (200 mg of elemental calcium total) by mouth 3 (three) times daily as needed for indigestion or heartburn. 03/31/22   Johnson, Clanford L, MD   diltiazem (CARDIZEM CD) 120 MG 24 hr capsule Take 1 capsule (120 mg total) by mouth daily. 03/27/22   Catarina Hartshorn, MD  folic acid (FOLVITE) 1 MG tablet Take 1 tablet (1 mg total) by mouth daily. 04/01/22   Johnson, Clanford L, MD  levETIRAcetam (KEPPRA) 500 MG tablet Take 1 tablet (500 mg total) by mouth 2 (two) times daily. 04/22/22 04/17/23  Windell Norfolk, MD  magnesium oxide (MAG-OX) 400 (240 Mg) MG tablet Take 1 tablet (400 mg total) by mouth 2 (two) times daily. 03/27/22   Catarina Hartshorn, MD  Metoprolol Tartrate 75 MG TABS Take 75 mg by mouth 2 (two) times daily. 03/27/22   Catarina Hartshorn, MD  omeprazole (PRILOSEC) 20 MG capsule Take 1 capsule (20 mg total) by mouth daily. Alternates with nexium 03/31/22   Johnson, Clanford L, MD  thiamine (VITAMIN B-1) 100 MG tablet Take 1 tablet (100 mg total) by mouth daily. 04/01/22   Cleora Fleet, MD    Physical Exam: Vitals:   05/25/22 2105 05/25/22 2130 05/25/22 2200 05/25/22 2300  BP:  105/73 114/76 126/78  Pulse:  93 94 86  Resp:  17 (!) 30 17  SpO2: 100% 98% 100% 97%   Constitutional: Lethargic, altered Eyes: PERRL, lids and conjunctivae normal ENMT: Mucous membranes are moist. Posterior pharynx clear of any exudate or lesions.Normal dentition.  Neck: normal, supple, no masses, no thyromegaly Respiratory: clear to auscultation bilaterally,  no wheezing, no crackles. Normal respiratory effort. No accessory muscle use.  Cardiovascular: Regular rate and rhythm, no murmurs / rubs / gallops. No extremity edema. 2+ pedal pulses. No carotid bruits.  Abdomen: no tenderness, no masses palpated. No hepatosplenomegaly. Bowel sounds positive.  Musculoskeletal: no clubbing / cyanosis. No joint deformity upper and lower extremities. Good ROM, no contractures. Normal muscle tone.  Skin: no rashes, lesions, ulcers. No induration Neurologic: Localizes bilaterally, not following commands, post-ictal Psychiatric: Unable  Data Reviewed:       Latest Ref  Rng & Units 05/25/2022    9:04 PM 03/30/2022    4:26 AM 03/29/2022    6:55 AM  CBC  WBC 4.0 - 10.5 K/uL 5.9  3.2  3.1   Hemoglobin 13.0 - 17.0 g/dL 23.5  36.1  44.3   Hematocrit 39.0 - 52.0 % 39.9  33.4  33.7   Platelets 150 - 400 K/uL 256  358  329       Latest Ref Rng & Units 05/25/2022    9:04 PM 03/30/2022    4:26 AM 03/29/2022    6:55 AM  CMP  Glucose 70 - 99 mg/dL 154  008  676   BUN 6 - 20 mg/dL 8  10  7    Creatinine 0.61 - 1.24 mg/dL  1.95  0.93   Sodium 135 - 145 mmol/L 141  133  133   Potassium 3.5 - 5.1 mmol/L 3.8  4.0  3.3   Chloride 98 - 111 mmol/L 103  100  100   CO2 22 - 32 mmol/L 15  24  22    Calcium 8.9 - 10.3 mg/dL 9.5  8.9  8.4   Total Protein 6.5 - 8.1 g/dL 7.4     Total Bilirubin 0.3 - 1.2 mg/dL 0.5     Alkaline Phos 38 - 126 U/L 71     AST 15 - 41 U/L 32     ALT 0 - 44 U/L 20      CT head = no acute findings.  Mg 2.2  Assessment and Plan: * Seizures (HCC) Seizure disorder.  Possibly as a sequella of the TBI (contusion and SDH) in Aug this year.  DDx includes EtOH withdrawal seizures given h/o EtOH abuse, though report from family is no recent drinking.  (Or maybe both playing a role). Seizure precautions Tele monitor CIWA for PRN ativan Getting loaded with keppra and dose increased per neurology consult  Alcohol abuse EtOH level <10 today. Unclear timing of last drink or when he quit drinking. CIWA Tele monitor Seizure treatment as above.  Essential hypertension Cont home BP meds      Advance Care Planning:   Code Status: Full Code  Consults: neurology  Family Communication: Family at bedside  Severity of Illness: The appropriate patient status for this patient is OBSERVATION. Observation status is judged to be reasonable and necessary in order to provide the required intensity of service to ensure the patient's safety. The patient's presenting symptoms, physical exam findings, and initial radiographic and laboratory data in the  context of their medical condition is felt to place them at decreased risk for further clinical deterioration. Furthermore, it is anticipated that the patient will be medically stable for discharge from the hospital within 2 midnights of admission.   Author: ., DO 05/26/2022 12:23 AM  For on call review [email or web address].

## 2022-05-26 NOTE — ED Notes (Signed)
Mittens placed on patient, will continue to monitor

## 2022-05-26 NOTE — ED Notes (Signed)
Entered room for rounding and found pt starring into space and not answering questions verbally. ED provider made aware at this time

## 2022-05-26 NOTE — Progress Notes (Signed)
Pulled out most of his EEG leads. Will order mitts to prevent him from pulling out EEG leads. Primary team and RN notified. We do not have a tech tonight so may not be able to hook him back up if he pulls leads off again tonight.  Erick Blinks [facility] Pager Number [phone]

## 2022-05-26 NOTE — ED Notes (Signed)
[name] want call about pt  Eric Conley [phone]

## 2022-05-26 NOTE — ED Notes (Signed)
Admit provider at bedside 

## 2022-05-26 NOTE — Consult Note (Signed)
NEUROLOGY CONSULTATION NOTE   Date of service: May 26, 2022 Patient Name: Eric Conley MRN:  [id number] DOB:  04-May-1961 Reason for consult: "seizures" Requesting Provider: Aline August, MD _ _ _   _ __   _ __ _ _  __ __   _ __   __ _  History of Present Illness  Eric Conley is a 61 y.o. male with medical history significant of EtOH abuse in past, SDH in Aug.  Seizure disorder, HTN.   Pt living with family since SDH in Aug.  Apparently has been taking his medications as prescribed and family has ensured that this is the case. He does have a history of alcohol abuse, occasionally drinks but per family he is not drinking very much since staying with family since August.   Pt in to ED with sudden onset of seizure-like activity.  Generalized tonic-clonic with urinary incontinence.  Recurrent seizure en-route with EMS.   3rd seizure in ED seen by EDP and neurologist.   ROS   Unable to review all systems due to mental status/inability to answer questions.   Past History   Past Medical History:  Diagnosis Date   Acid reflux    Depression    Hypertension    Past Surgical History:  Procedure Laterality Date   NO PAST SURGERIES     No family history on file. Social History   Socioeconomic History   Marital status: Legally Separated    Spouse name: Not on file   Number of children: Not on file   Years of education: Not on file   Highest education level: Not on file  Occupational History   Not on file  Tobacco Use   Smoking status: Every Day    Packs/day: 0.25    Types: Cigarettes   Smokeless tobacco: Never  Vaping Use   Vaping Use: Never used  Substance and Sexual Activity   Alcohol use: Not Currently    Alcohol/week: 7.0 standard drinks of alcohol    Types: 7 Cans of beer per week    Comment: 1 beer daily, last drink evening of 12/28/21   Drug use: Not Currently   Sexual activity: Not on file  Other Topics Concern   Not on file  Social History Narrative    Right handed   Caffeine use: caffeine pill as needed, energy drinks (1 per day)   Social Determinants of Health   Financial Resource Strain: Not on file  Food Insecurity: No Food Insecurity (03/20/2022)   Hunger Vital Sign    Worried About Running Out of Food in the Last Year: Never true    Ran Out of Food in the Last Year: Never true  Transportation Needs: No Transportation Needs (03/20/2022)   PRAPARE - Hydrologist (Medical): No    Lack of Transportation (Non-Medical): No  Physical Activity: Not on file  Stress: Not on file  Social Connections: Not on file   Allergies  Allergen Reactions   Codeine    Poison Sumac Extract     Medications  (Not in a hospital admission)    Vitals   Vitals:   05/26/22 0830 05/26/22 0900 05/26/22 0930 05/26/22 0940  BP: 104/68 112/76 136/76   Pulse: 82 72 99   Resp: 17 17 17    Temp:    98.9 F (37.2 C)  TempSrc:    Oral  SpO2: 97% 96% 98%   Weight:      Height:  Body mass index is 23.27 kg/m.  Physical Exam   General: Lying on bed, restless, NAD.  HENT: Normal oropharynx and mucosa. Normal external appearance of ears and nose.  Neck: Supple, no pain or tenderness  CV: No JVD. No peripheral edema. 2+ pedal pulses.  Pulmonary: Symmetric Chest rise. Unlabored respiratory effort.  Abdomen: Soft to touch, non-tender.  Ext: No cyanosis, edema, or deformity  Skin: Normal palpation of skin.  Rash to LLE.  Musculoskeletal: Normal digits and nails by inspection. No clubbing.   Neurologic Examination  Mental status/Cognition: Awake, restless. Does not automatically regard when you call his name. Followed commands x2 after multiple requests.  Speech/language:  Eric Conley his name with no dysarthria noted. However, patient did not respond to any other questions so unable to thoroughly assess speech.  Cranial nerves:  Pupils are round and reactive.  Does not cross midline or track. Symmetric facial movement  and smile.  Hearing intact. Tongue protrusion midline.  Uncooperative with sensation assessment, head turn, shoulder shrug.   Motor:  Moving all extremities on exam, attempting to get out of bed.  Did not follow commands to assess for drift/grip strength.   Sensation: Withdraws to pain in all extremities  Coordination/Complex Motor:  Unable to assess   Labs   CBC:  Recent Labs  Lab 05/25/22 2104 05/26/22 0407  WBC 5.9 7.1  NEUTROABS 3.4  --   HGB 13.2 11.5*  HCT 39.9 34.0*  MCV 93.4 91.2  PLT 256 [id number]    Basic Metabolic Panel:  Lab Results  Component Value Date   NA 139 05/26/2022   K 3.5 05/26/2022   CO2 24 05/26/2022   GLUCOSE 97 05/26/2022   BUN 7 05/26/2022   CREATININE 0.89 05/26/2022   CALCIUM 8.4 (L) 05/26/2022   GFRNONAA >60 05/26/2022   GFRAA >60 12/06/2019   Lipid Panel: No results found for: "LDLCALC" HgbA1c: No results found for: "HGBA1C" Urine Drug Screen:     Component Value Date/Time   LABOPIA NONE DETECTED 03/20/2022 2047   COCAINSCRNUR NONE DETECTED 03/20/2022 2047   LABBENZ POSITIVE (A) 03/20/2022 2047   AMPHETMU NONE DETECTED 03/20/2022 2047   THCU NONE DETECTED 03/20/2022 2047   LABBARB NONE DETECTED 03/20/2022 2047    Alcohol Level     Component Value Date/Time   ETH <10 05/25/2022 2102   I have reviewed labs in epic and the results pertinent to this consultation are: Sodium 139 Calcium 8.4 Magnesium 2.2 (12/20) Creatinine 0.89  CT Head without contrast(Personally reviewed): Negative for acute process. Stable encephalomalacia in left temporal lobe.   rEEG:  Possibly had a seizure at 0502 and another at 0753.  Impression   Eric Conley is a 61 y.o. male with a history of seizures who presents with seizure flurry of unclear etiology.  Apparently has been taking his medications as prescribed and family has ensured that this is the case.  He does have a history of alcohol abuse, occasionally drinks but per family he is not  drinking very much since staying with family since August.   Following examination by Neurology last night, he had a repeat episode of aphasia and stopped localizing that he was moving bilateral upper extremities--possibly that he had another unwitnessed seizure/subclinical seizure.    On exam today, patient is restless, attempting to get out of bed, sometimes cooperative with exam and moving all extremities.  Etiology of his seizures and subclinical seizures is unclear.  No fever, UA not concerning for UTI.  UDS  is negative.  CXR not concerning for pneumonia.  Recommendations  Recommendations: 1) Keppra 1.5 g given, repeat 1.5 g given. 2) Continue LTM EEG. If negative, will D/C 12/22 3) Possible seizures at 0502 and 0753 (EEGread pending). Will load with Vimpat 200mg  IV once and increase maintenance Keppra to 1500mg  BID. 4) UDS pending 5) Continue CIWA 6) MRI Brain after cEEG 7) Neurology will follow. ______________________________________________________________________  Thank you for the opportunity to take part in the care of this patient. If you have any further questions, please contact the neurology consultation attending. _ _ _   _ __   _ __ _ _  __ __   _ __   __ _ Pt seen by Neuro NP/APP and later by MD. Note/plan to be edited by MD as needed.    03-29-1993, DNP, AGACNP-BC [facility] Please use AMION for pager and EPIC for messaging   NEUROHOSPITALIST ADDENDUM Performed a face to face diagnostic evaluation.   I have reviewed the contents of history and physical exam as documented by PA/ARNP/Resident and agree with above documentation.  I have discussed and formulated the above plan as documented. Edits to the note have been made as needed.  03-29-1993, MD [facility] Lynnae January   If 7pm to 7am, please call on call as listed on [facility]ION.

## 2022-05-26 NOTE — Assessment & Plan Note (Addendum)
Seizure disorder.  Possibly as a sequella of the TBI (contusion and SDH) in Aug this year.  DDx includes EtOH withdrawal seizures given h/o EtOH abuse, though report from family is no recent drinking.  (Or maybe both playing a role). Seizure precautions Tele monitor CIWA for PRN ativan Getting loaded with keppra and dose increased per neurology consult

## 2022-05-26 NOTE — ED Notes (Signed)
ED TO INPATIENT HANDOFF REPORT  ED Nurse Name and Phone #: Aunya Lemler  S Name/Age/Gender Eric Conley 61 y.o. male Room/Bed: 002C/002C  Code Status   Code Status: Full Code  Home/SNF/Other Home Patient oriented to: self Is this baseline? No   Triage Complete: Triage complete  Chief Complaint Seizures (HCC) [R56.9] Seizure (HCC) [R56.9]  Triage Note BIB [facility] for seizures from home. Staying with family going through ETOH rehab. Pt had a subdural bleed in Aug 2023 had first seizure in Oct 2023. Seizure at home unwitnessed. Pt was found on the floor by family. On EMS arrival pt was alert to person and ambulatory. While en route pt had a grand mal lasted approximately 4 min then went into tonic clonic seizure. Pt received a total of Versed 5mg  last dose at 2030. NPA in rt nare, c-collar in place PIV 18ga Lot ac, NSR on monitor, CBG 131.   Allergies Allergies  Allergen Reactions   Codeine    Poison Sumac Extract     Level of Care/Admitting Diagnosis ED Disposition     ED Disposition  Admit   Condition  --   Comment  Hospital Area: [facility] [[id number]]  Level of Care: Progressive [102]  Admit to Progressive based on following criteria: NEUROLOGICAL AND NEUROSURGICAL complex patients with significant risk of instability, who do not meet ICU criteria, yet require close observation or frequent assessment (< / = every 2 - 4 hours) with medical / nursing intervention.  May admit patient to [facility] or [facility] if equivalent level of care is available:: No  Covid Evaluation: Asymptomatic - no recent exposure (last 10 days) testing not required  Diagnosis: Seizure [facility]) [205090]  Admitting Physician: [facility] Glade Lloyd  Attending Physician: [[id number]] Glade Lloyd  Certification:: I certify this patient will need inpatient services for at least 2 midnights  Estimated Length of Stay: 2          B Medical/Surgery History Past Medical History:   Diagnosis Date   Acid reflux    Depression    Hypertension    Past Surgical History:  Procedure Laterality Date   NO PAST SURGERIES       A IV Location/Drains/Wounds Patient Lines/Drains/Airways Status     Active Line/Drains/Airways     Name Placement date Placement time Site Days   Peripheral IV 05/25/22 18 G Anterior;Distal;Left;Upper Arm 05/25/22  2106  Arm  1   Peripheral IV 05/25/22 20 G Right Antecubital 05/25/22  2100  Antecubital  1   External Urinary Catheter 05/26/22  1016  --  less than 1            Intake/Output Last 24 hours  Intake/Output Summary (Last 24 hours) at 05/26/2022 1934 Last data filed at 05/26/2022 05/28/2022 Gross per 24 hour  Intake 382.28 ml  Output 650 ml  Net -267.72 ml    Labs/Imaging Results for orders placed or performed during the hospital encounter of 05/25/22 (from the past 48 hour(s))  Urinalysis, Routine w reflex microscopic Urine, Clean Catch     Status: None   Collection Time: 05/25/22 10:38 AM  Result Value Ref Range   Color, Urine YELLOW YELLOW   APPearance CLEAR CLEAR   Specific Gravity, Urine 1.011 1.005 - 1.030   pH 6.0 5.0 - 8.0   Glucose, UA NEGATIVE NEGATIVE mg/dL   Hgb urine dipstick NEGATIVE NEGATIVE   Bilirubin Urine NEGATIVE NEGATIVE   Ketones, ur NEGATIVE NEGATIVE mg/dL   Protein, ur NEGATIVE  NEGATIVE mg/dL   Nitrite NEGATIVE NEGATIVE   Leukocytes,Ua NEGATIVE NEGATIVE    Comment: Performed at [facility], [place] [address], [place], [place] [address]  Ethanol     Status: None   Collection Time: 05/25/22  9:02 PM  Result Value Ref Range   Alcohol, Ethyl (B) <10 <10 mg/dL    Comment: (NOTE) Lowest detectable limit for serum alcohol is 10 mg/dL.  For medical purposes only. Performed at [facility], [place] [address] [place], [place] [address]   CBG monitoring, ED     Status: Abnormal   Collection Time: 05/25/22  9:03 PM  Result Value Ref Range   Glucose-Capillary 126 (H) 70 - 99 mg/dL     Comment: Glucose reference range applies only to samples taken after fasting for at least 8 hours.  Comprehensive metabolic panel     Status: Abnormal   Collection Time: 05/25/22  9:04 PM  Result Value Ref Range   Sodium 141 135 - 145 mmol/L   Potassium 3.8 3.5 - 5.1 mmol/L   Chloride 103 98 - 111 mmol/L   CO2 15 (L) 22 - 32 mmol/L   Glucose, Bld 119 (H) 70 - 99 mg/dL    Comment: Glucose reference range applies only to samples taken after fasting for at least 8 hours.   BUN 8 6 - 20 mg/dL   Creatinine, Ser 1.30 (H) 0.61 - 1.24 mg/dL   Calcium 9.5 8.9 - 10.3 mg/dL   Total Protein 7.4 6.5 - 8.1 g/dL   Albumin 4.0 3.5 - 5.0 g/dL   AST 32 15 - 41 U/L   ALT 20 0 - 44 U/L   Alkaline Phosphatase 71 38 - 126 U/L   Total Bilirubin 0.5 0.3 - 1.2 mg/dL   GFR, Estimated >60 >60 mL/min    Comment: (NOTE) Calculated using the CKD-EPI Creatinine Equation (2021)    Anion gap 23 (H) 5 - 15    Comment: ELECTROLYTES REPEATED TO VERIFY Performed at [place] [address] [place],  [address]   CBC with Differential/Platelet     Status: Abnormal   Collection Time: 05/25/22  9:04 PM  Result Value Ref Range   WBC 5.9 4.0 - 10.5 K/uL   RBC 4.27 4.22 - 5.81 MIL/uL   Hemoglobin 13.2 13.0 - 17.0 g/dL   HCT 39.9 39.0 - 52.0 %   MCV 93.4 80.0 - 100.0 fL   MCH 30.9 26.0 - 34.0 pg   MCHC 33.1 30.0 - 36.0 g/dL   RDW 16.0 (H) 11.5 - 15.5 %   Platelets 256 150 - 400 K/uL   nRBC 0.0 0.0 - 0.2 %   Neutrophils Relative % 56 %   Neutro Abs 3.4 1.7 - 7.7 K/uL   Lymphocytes Relative 29 %   Lymphs Abs 1.7 0.7 - 4.0 K/uL   Monocytes Relative 11 %   Monocytes Absolute 0.6 0.1 - 1.0 K/uL   Eosinophils Relative 2 %   Eosinophils Absolute 0.1 0.0 - 0.5 K/uL   Basophils Relative 1 %   Basophils Absolute 0.0 0.0 - 0.1 K/uL   Immature Granulocytes 1 %   Abs Immature Granulocytes 0.04 0.00 - 0.07 K/uL    Comment: Performed at [facility] [address], [place],  [address]   Magnesium     Status: None   Collection Time: 05/25/22  9:04 PM  Result Value Ref Range   Magnesium 2.2 1.7 - 2.4 mg/dL  Comment: Performed at [facility], [address] [place], [place] [address]  Protime-INR     Status: None   Collection Time: 05/25/22  9:04 PM  Result Value Ref Range   Prothrombin Time 13.4 11.4 - 15.2 seconds   INR 1.0 0.8 - 1.2    Comment: (NOTE) INR goal varies based on device and disease states. Performed at [facility], [address], [place], [place] [address]   CBC     Status: Abnormal   Collection Time: 05/26/22  4:07 AM  Result Value Ref Range   WBC 7.1 4.0 - 10.5 K/uL   RBC 3.73 (L) 4.22 - 5.81 MIL/uL   Hemoglobin 11.5 (L) 13.0 - 17.0 g/dL   HCT 09.8 (L) 11.9 - 14.7 %   MCV 91.2 80.0 - 100.0 fL   MCH 30.8 26.0 - 34.0 pg   MCHC 33.8 30.0 - 36.0 g/dL   RDW 82.9 (H) 56.2 - 13.0 %   Platelets 224 150 - 400 K/uL   nRBC 0.0 0.0 - 0.2 %    Comment: Performed at [facility], [address] [place], [place] [address]  Basic metabolic panel     Status: Abnormal   Collection Time: 05/26/22  4:07 AM  Result Value Ref Range   Sodium 139 135 - 145 mmol/L   Potassium 3.5 3.5 - 5.1 mmol/L   Chloride 105 98 - 111 mmol/L   CO2 24 22 - 32 mmol/L   Glucose, Bld 97 70 - 99 mg/dL    Comment: Glucose reference range applies only to samples taken after fasting for at least 8 hours.   BUN 7 6 - 20 mg/dL   Creatinine, Ser 4.69 0.61 - 1.24 mg/dL   Calcium 8.4 (L) 8.9 - 10.3 mg/dL   GFR, Estimated >62 >95 mL/min    Comment: (NOTE) Calculated using the CKD-EPI Creatinine Equation (2021)    Anion gap 10 5 - 15    Comment: Performed at [facility], [address] [place], [place] [address]  Rapid urine drug screen (hospital performed)     Status: Abnormal   Collection Time: 05/26/22 10:38 AM  Result Value Ref Range   Opiates NONE DETECTED NONE DETECTED   Cocaine NONE DETECTED NONE DETECTED   Benzodiazepines POSITIVE (A) NONE  DETECTED   Amphetamines NONE DETECTED NONE DETECTED   Tetrahydrocannabinol NONE DETECTED NONE DETECTED   Barbiturates NONE DETECTED NONE DETECTED    Comment: (NOTE) DRUG SCREEN FOR MEDICAL PURPOSES ONLY.  IF CONFIRMATION IS NEEDED FOR ANY PURPOSE, NOTIFY LAB WITHIN 5 DAYS.  LOWEST DETECTABLE LIMITS FOR URINE DRUG SCREEN Drug Class                     Cutoff (ng/mL) Amphetamine and metabolites    1000 Barbiturate and metabolites    200 Benzodiazepine                 200 Opiates and metabolites        300 Cocaine and metabolites        300 THC                            50 Performed at [facility], [address], [place], [place] [address]    CT HEAD WO CONTRAST  Result Date: 05/25/2022 CLINICAL DATA:  Head trauma, abnormal mental status.  Seizures. EXAM: CT HEAD WITHOUT  CONTRAST CT CERVICAL SPINE WITHOUT CONTRAST TECHNIQUE: Multidetector CT imaging of the head and cervical spine was performed following the standard protocol without intravenous contrast. Multiplanar CT image reconstructions of the cervical spine were also generated. RADIATION DOSE REDUCTION: This exam was performed according to the departmental dose-optimization program which includes automated exposure control, adjustment of the mA and/or kV according to patient size and/or use of iterative reconstruction technique. COMPARISON:  03/24/2022. FINDINGS: CT HEAD FINDINGS Brain: No acute intracranial hemorrhage, midline shift or mass effect. No extra-axial fluid collection. The previously described subdural hematoma is no longer seen. Periventricular white matter hypodensities are present bilaterally. Encephalomalacia is noted in the frontal and temporal lobes on the left, unchanged. No hydrocephalus. Mild atrophy is noted. Vascular: No hyperdense vessel or unexpected calcification. Skull: Normal. Negative for fracture or focal lesion. Sinuses/Orbits: There is partial opacification of the maxillary sinuses and ethmoid air  cells. No acute orbital abnormality. Other: None. CT CERVICAL SPINE FINDINGS Alignment: Mild retrolisthesis at C5-C6. Skull base and vertebrae: No acute fracture. No primary bone lesion or focal pathologic process. Soft tissues and spinal canal: No prevertebral fluid or swelling. No visible canal hematoma. Disc levels: Multilevel intervertebral disc space narrowing, uncovertebral osteophyte formation, and mild facet arthropathy. Upper chest: Emphysematous changes are noted at the lung apices. Other: Carotid artery calcifications. IMPRESSION: 1. No acute intracranial hemorrhage. The previously described subdural hematoma is no longer seen. 2. Mild atrophy with chronic microvascular ischemic changes. 3. Stable encephalomalacia in the frontal and temporal lobes on the left. 4. Multilevel degenerative changes in the cervical spine without evidence of acute fracture. 5. Emphysema. Electronically Signed   By: Brett Fairy M.D.   On: 05/25/2022 21:57   CT CERVICAL SPINE WO CONTRAST  Result Date: 05/25/2022 CLINICAL DATA:  Head trauma, abnormal mental status.  Seizures. EXAM: CT HEAD WITHOUT CONTRAST CT CERVICAL SPINE WITHOUT CONTRAST TECHNIQUE: Multidetector CT imaging of the head and cervical spine was performed following the standard protocol without intravenous contrast. Multiplanar CT image reconstructions of the cervical spine were also generated. RADIATION DOSE REDUCTION: This exam was performed according to the departmental dose-optimization program which includes automated exposure control, adjustment of the mA and/or kV according to patient size and/or use of iterative reconstruction technique. COMPARISON:  03/24/2022. FINDINGS: CT HEAD FINDINGS Brain: No acute intracranial hemorrhage, midline shift or mass effect. No extra-axial fluid collection. The previously described subdural hematoma is no longer seen. Periventricular white matter hypodensities are present bilaterally. Encephalomalacia is noted in the  frontal and temporal lobes on the left, unchanged. No hydrocephalus. Mild atrophy is noted. Vascular: No hyperdense vessel or unexpected calcification. Skull: Normal. Negative for fracture or focal lesion. Sinuses/Orbits: There is partial opacification of the maxillary sinuses and ethmoid air cells. No acute orbital abnormality. Other: None. CT CERVICAL SPINE FINDINGS Alignment: Mild retrolisthesis at C5-C6. Skull base and vertebrae: No acute fracture. No primary bone lesion or focal pathologic process. Soft tissues and spinal canal: No prevertebral fluid or swelling. No visible canal hematoma. Disc levels: Multilevel intervertebral disc space narrowing, uncovertebral osteophyte formation, and mild facet arthropathy. Upper chest: Emphysematous changes are noted at the lung apices. Other: Carotid artery calcifications. IMPRESSION: 1. No acute intracranial hemorrhage. The previously described subdural hematoma is no longer seen. 2. Mild atrophy with chronic microvascular ischemic changes. 3. Stable encephalomalacia in the frontal and temporal lobes on the left. 4. Multilevel degenerative changes in the cervical spine without evidence of acute fracture. 5. Emphysema. Electronically Signed   By: Mickel Baas  Lovena Le M.D.   On: 05/25/2022 21:57    Pending Labs Unresulted Labs (From admission, onward)     Start     Ordered   05/27/22 0500  Comprehensive metabolic panel  Tomorrow morning,   R        05/26/22 0953   05/27/22 0500  Magnesium  Tomorrow morning,   R        05/26/22 0953            Vitals/Pain Today's Vitals   05/26/22 1400 05/26/22 1430 05/26/22 1626 05/26/22 1700  BP: 107/74 124/82  114/63  Pulse: 87 (!) 104  88  Resp: 18 (!) 25  16  Temp:   98 F (36.7 C)   TempSrc:      SpO2: 97% 98%  96%  Weight:      Height:      PainSc:        Isolation Precautions No active isolations  Medications Medications  thiamine (VITAMIN B1) tablet 100 mg (100 mg Oral Given 05/26/22 0943)    Or   thiamine (VITAMIN B1) injection 100 mg ( Intravenous See Alternative [id number])  folic acid (FOLVITE) tablet 1 mg (1 mg Oral Given 05/26/22 0943)  multivitamin with minerals tablet 1 tablet (1 tablet Oral Given 05/26/22 0943)  LORazepam (ATIVAN) tablet 1-4 mg ( Oral See Alternative 05/26/22 1633)    Or  LORazepam (ATIVAN) injection 1-4 mg (2 mg Intravenous Given 05/26/22 1633)  enoxaparin (LOVENOX) injection 40 mg (40 mg Subcutaneous Given 05/26/22 0944)  acetaminophen (TYLENOL) tablet 650 mg (has no administration in time range)    Or  acetaminophen (TYLENOL) suppository 650 mg (has no administration in time range)  ondansetron (ZOFRAN) tablet 4 mg (has no administration in time range)    Or  ondansetron (ZOFRAN) injection 4 mg (has no administration in time range)  levETIRAcetam (KEPPRA) IVPB 1500 mg/ 100 mL premix (1,500 mg Intravenous New Bag/Given 05/26/22 1832)  lacosamide (VIMPAT) 100 mg in sodium chloride 0.9 % 25 mL IVPB (0 mg Intravenous Stopped 05/26/22 1741)  levETIRAcetam (KEPPRA) IVPB 1000 mg/100 mL premix (0 mg Intravenous Stopped 05/25/22 2142)    And  levETIRAcetam (KEPPRA) IVPB 500 mg/100 mL premix (0 mg Intravenous Stopped 05/25/22 2142)  levETIRAcetam (KEPPRA) IVPB 1500 mg/ 100 mL premix (0 mg Intravenous Stopped 05/26/22 0002)  LORazepam (ATIVAN) injection 1 mg (1 mg Intravenous Given 05/25/22 2324)  LORazepam (ATIVAN) injection 1 mg (1 mg Intravenous Given 05/26/22 0043)  lacosamide (VIMPAT) 200 mg in sodium chloride 0.9 % 25 mL IVPB (0 mg Intravenous Stopped 05/26/22 1438)  valproate (DEPACON) 1,500 mg in dextrose 5 % 50 mL IVPB (0 mg Intravenous Stopped 05/26/22 1741)    Mobility walks Low fall risk   Focused Assessments    R Recommendations: See Admitting Provider Note  Report given to:   Additional Notes:

## 2022-05-26 NOTE — Procedures (Addendum)
Patient Name: Eric Conley  MRN: [id number]  Epilepsy Attending: Charlsie Quest  Referring Physician/Provider: Rejeana Brock, MD  Duration: 05/26/2022 0124 to 05/27/2022 0124  Patient history: 61 year old male with a history of seizures who presents with seizure flurry of unclear etiology.  EEG to evaluate for seizure.  Level of alertness: Awake, asleep  AEDs during EEG study: LEV, LCM, VPA  Technical aspects: This EEG study was done with scalp electrodes positioned according to the 10-20 International system of electrode placement. Electrical activity was reviewed with band pass filter of 1-70Hz , sensitivity of 7 uV/mm, display speed of 23mm/sec with a 60Hz  notched filter applied as appropriate. EEG data were recorded continuously and digitally stored.  Video monitoring was available and reviewed as appropriate.  Description: The posterior dominant rhythm consists of 9-10 Hz activity of moderate voltage (25-35 uV) seen predominantly in posterior head regions, symmetric and reactive to eye opening and eye closing. Sleep was characterized by vertex waves, sleep spindles (12 to 14 Hz), maximal frontocentral region. EEG also showed continuous generalized 3-6hz  theta-delta slowing with overriding 15-18hz  beta activity.  Six seizures without clinical signs were noted on 05/27/2022 at 0502, 0753, 0919, 1036, 1315, 1547. During seizure, EEG showed generalized maximal bifrontal sharply contoured 5 to 7 Hz theta slowing admixed with 2 to 3 Hz delta slowing with evolution in morphology and frequency. Average duration of seizures was around 30 minutes.   Hyperventilation and photic stimulation were not performed.     ABNORMALITY - Seizure without clinical signs, generalized - Continuous slow, generalized  IMPRESSION: This study showed six seizures without clinical signs with generalized onset on 05/27/2022 at 0502, 0753, 0919, 1036, 1315 and 1547 lasting about 30 minutes each. As medications  were adjusted, EEG improved and was suggestive of moderate diffuse encephalopathy.    Eric Conley 05/29/2022

## 2022-05-26 NOTE — Progress Notes (Signed)
Prelim EEG with no ongoing seizures, will continue to monitor  Ritta Slot, MD [facility] [phone]  If 7pm- 7am, please page neurology on call as listed in AMION.

## 2022-05-26 NOTE — Consult Note (Incomplete)
Neurology Consultation Reason for Consult: Seizure Referring Physician: Jarold Motto, R  CC: Seizure  History is obtained from: Chart review  HPI: Eric Conley is a 61 y.o. male who was in his normal state of health earlier when he was seen to have seizure-like activity.  He has a history of alcohol abuse, traumatic subdural, seizures and was seen to have seizure at home.  In transit, he was seen to have another generalized tonic-clonic seizure with urinary incontinence and was given 2.5 mg of Versed.  He then had eye movements that made the EMS transporter so suspect he was still having ongoing seizure activity and she gave him an additional 2.5.  Following this, he has begun responding to pain, and gradually improving.   Past Medical History:  Diagnosis Date  . Acid reflux   . Depression   . Hypertension      No family history on file.   Social History:  reports that he has been smoking cigarettes. He has been smoking an average of .25 packs per day. He has never used smokeless tobacco. He reports that he does not currently use alcohol after a past usage of about 7.0 standard drinks of alcohol per week. He reports that he does not currently use drugs.   Exam: Current vital signs: There were no vitals taken for this visit. Vital signs in last 24 hours: BP: ()/()  Arterial Line BP: ()/()    Physical Exam   Neuro: Mental Status: He is lethargic, but does open eyes and regard me when I call his name, that he quickly closes them again and continues thrashing.  He does not follow commands. Cranial Nerves: II:  Pupils are equal, round, and reactive to light.  III,IV, VI: Eyes are midline, he does not voluntarily cross midline to either direction and unable to check doll's eye due to c-collar in place. V: VII: Facial movement is grossly symmetric given the limitations of the c-collar, he blinks to eyelid stimulation bilaterally Motor: He localizes to noxious stimulation bilaterally,  withdraws bilateral lower extremities Sensory: As above  Cerebellar: Does not perform  On subsequent exam, he had improved to being able to follow commands with showing thumbs bilaterally.    I have reviewed labs in epic and the results pertinent to this consultation are:   I have reviewed the images obtained:***  Impression: ***  Recommendations: 1) ***   Ritta Slot, MD [facility] [phone]  If 7pm- 7am, please page neurology on call as listed in AMION.

## 2022-05-26 NOTE — ED Notes (Signed)
Neurology at beside.

## 2022-05-26 NOTE — Progress Notes (Signed)
Discussed with Dr. Melynda Ripple, will give a load of Valproic acid and start maintenance Vimpat 100mg  BID.  [facility] Pager Number Erick Blinks

## 2022-05-26 NOTE — ED Notes (Signed)
EEG at bedside.

## 2022-05-26 NOTE — Assessment & Plan Note (Signed)
Cont home BP meds ?

## 2022-05-26 NOTE — ED Notes (Signed)
Patient is attempting to get out of bed. This tech encouraged patient to stay in bed due to cardiac monitor and EEG taken place. Patient refused to listen. RN is aware.

## 2022-05-26 NOTE — Progress Notes (Signed)
Patient admitted earlier this morning for seizures and has been started on Keppra.  Neurology following.  Patient seen and examined at bedside, sleepy, wakes up only very slightly.  I have reviewed patient's medical records including this morning's H&P, current vitals, labs, medications.  Currently undergoing EEG.  Follow neurology recommendations and EEG.

## 2022-05-26 NOTE — ED Notes (Signed)
Patient pulled off EEG leads, OOB walking around room increased confusion. Patient given PRN medication. Will continue to monitor

## 2022-05-26 NOTE — Progress Notes (Signed)
LTM EEG hooked up and running - no initial skin breakdown - Patient is in the ER, awaiting room.Marland Kitchen

## 2022-05-26 NOTE — Evaluation (Signed)
Clinical/Bedside Swallow Evaluation Patient Details  Name: Eric Conley MRN: [id number] Date of Birth: [birth year]  Today's Date: 05/26/2022 Time: SLP Start Time (ACUTE ONLY): 1525 SLP Stop Time (ACUTE ONLY): 1535 SLP Time Calculation (min) (ACUTE ONLY): 10 min  Past Medical History:  Past Medical History:  Diagnosis Date   Acid reflux    Depression    Hypertension    Past Surgical History:  Past Surgical History:  Procedure Laterality Date   NO PAST SURGERIES     HPI:  Patient is a 61 y.o. male with PMH: ETOH abuse, GERD, depression, SDH in August of 2023, seizure disorder, HTN. He presented to the hospital on 05/26/22 with sudden onset of seizure-like activity. En-route to hospital via EMS he had recurrent seizure. CT head negative for acute intracranial abnormality, CT cervical spine showed Multilevel degenerative changes in the cervical spine without  evidence of acute fracture.    Assessment / Plan / Recommendation  Clinical Impression  Patient is not currently presenting with clinical s/s of dysphagia as per this bedside swallow evaluation. He himself denies any recent or current swallowing difficulties and reported that he ate breakfast and lunch meals today. SLP did not observe any overt s/s aspiration or penetration with straw sips of thin liquids (water). Patient politely declined any solids at this time. Swallow initiation appeared timely. SLP not recommending any further skilled interventions at this time. SLP Visit Diagnosis: Dysphagia, unspecified (R13.10)    Aspiration Risk  No limitations    Diet Recommendation Regular;Thin liquid   Liquid Administration via: Straw;Cup Medication Administration: Whole meds with liquid Supervision: Patient able to self feed Postural Changes: Seated upright at 90 degrees;Remain upright for at least 30 minutes after po intake    Other  Recommendations Oral Care Recommendations: Oral care BID;Patient independent with oral care     Recommendations for follow up therapy are one component of a multi-disciplinary discharge planning process, led by the attending physician.  Recommendations may be updated based on patient status, additional functional criteria and insurance authorization.  Follow up Recommendations No SLP follow up      Assistance Recommended at Discharge    Functional Status Assessment Patient has not had a recent decline in their functional status  Frequency and Duration   N/A         Prognosis   N/A     Swallow Study   General Date of Onset: 05/26/22 HPI: Patient is a 61 y.o. male with PMH: ETOH abuse, GERD, depression, SDH in August of 2023, seizure disorder, HTN. He presented to the hospital on 05/26/22 with sudden onset of seizure-like activity. En-route to hospital via EMS he had recurrent seizure. CT head negative for acute intracranial abnormality, CT cervical spine showed Multilevel degenerative changes in the cervical spine without  evidence of acute fracture. Type of Study: Bedside Swallow Evaluation Previous Swallow Assessment: none found Diet Prior to this Study: Regular;Thin liquids Temperature Spikes Noted: No Respiratory Status: Room air History of Recent Intubation: No Behavior/Cognition: Alert;Cooperative;Pleasant mood Oral Cavity Assessment: Within Functional Limits Oral Care Completed by SLP: No Oral Cavity - Dentition: Adequate natural dentition Vision: Functional for self-feeding Self-Feeding Abilities: Able to feed self Patient Positioning: Upright in bed Baseline Vocal Quality: Normal Volitional Cough: Strong Volitional Swallow: Able to elicit    Oral/Motor/Sensory Function Overall Oral Motor/Sensory Function: Within functional limits   Ice Chips     Thin Liquid Thin Liquid: Within functional limits Presentation: Straw;Self Fed    Nectar Thick  Honey Thick     Puree Puree: Not tested   Solid     Solid: Not tested      Angela Nevin, MA,  CCC-SLP Speech Therapy

## 2022-05-26 NOTE — Assessment & Plan Note (Signed)
EtOH level <10 today. Unclear timing of last drink or when he quit drinking. CIWA Tele monitor Seizure treatment as above.

## 2022-05-27 DIAGNOSIS — R569 Unspecified convulsions: Secondary | ICD-10-CM | POA: Diagnosis not present

## 2022-05-27 DIAGNOSIS — I10 Essential (primary) hypertension: Secondary | ICD-10-CM | POA: Diagnosis not present

## 2022-05-27 DIAGNOSIS — F1010 Alcohol abuse, uncomplicated: Secondary | ICD-10-CM | POA: Diagnosis not present

## 2022-05-27 LAB — COMPREHENSIVE METABOLIC PANEL
ALT: 17 U/L (ref 0–44)
AST: 17 U/L (ref 15–41)
Albumin: 3.5 g/dL (ref 3.5–5.0)
Alkaline Phosphatase: 74 U/L (ref 38–126)
Anion gap: 11 (ref 5–15)
BUN: 7 mg/dL (ref 6–20)
CO2: 25 mmol/L (ref 22–32)
Calcium: 9.5 mg/dL (ref 8.9–10.3)
Chloride: 107 mmol/L (ref 98–111)
Creatinine, Ser: 0.91 mg/dL (ref 0.61–1.24)
GFR, Estimated: >60 mL/min (ref >60)
Glucose, Bld: 100 mg/dL — ABNORMAL HIGH (ref 70–99)
Potassium: 3.2 mmol/L — ABNORMAL LOW (ref 3.5–5.1)
Sodium: 143 mmol/L (ref 135–145)
Total Bilirubin: 0.6 mg/dL (ref 0.3–1.2)
Total Protein: 6.4 g/dL — ABNORMAL LOW (ref 6.5–8.1)

## 2022-05-27 LAB — MAGNESIUM: Magnesium: 2 mg/dL (ref 1.7–2.4)

## 2022-05-27 MED ORDER — POTASSIUM CHLORIDE CRYS ER 20 MEQ PO TBCR
40.0000 meq | EXTENDED_RELEASE_TABLET | ORAL | Status: AC
  Administered 2022-05-27 (×2): 40 meq via ORAL
  Filled 2022-05-27 (×2): qty 2

## 2022-05-27 NOTE — Progress Notes (Signed)
LTM EEG discontinued - no skin breakdown at unhook.   

## 2022-05-27 NOTE — Progress Notes (Signed)
vLTM maintenance  All impedances below 10kohms.  [facility] called to monitor patient.  Patient moved from ED to 3W

## 2022-05-27 NOTE — Procedures (Addendum)
Patient Name: Eric Conley  MRN: [id number]  Epilepsy Attending: Charlsie Quest  Referring Physician/Provider: Rejeana Brock, MD  Duration: 05/27/2022 0124 to 05/27/2022 1700   Patient history: 61 year old male with a history of seizures who presents with seizure flurry of unclear etiology.  EEG to evaluate for seizure.   Level of alertness: Awake, asleep   AEDs during EEG study: LEV, LCM, VPA   Technical aspects: This EEG study was done with scalp electrodes positioned according to the 10-20 International system of electrode placement. Electrical activity was reviewed with band pass filter of 1-70Hz , sensitivity of 7 uV/mm, display speed of 64mm/sec with a 60Hz  notched filter applied as appropriate. EEG data were recorded continuously and digitally stored.  Video monitoring was available and reviewed as appropriate.   Description: No clear posterior dominant rhythm was seen. Sleep was characterized by vertex waves, sleep spindles (12 to 14 Hz), maximal frontocentral region. EEG showed continuous generalized 3 to 6 Hz theta-delta slowing admixed with 15 to 18 Hz beta activity with irregular morphology distributed symmetrically and diffusely. Hyperventilation and photic stimulation were not performed.   ABNORMALITY - Continuous slow, generalized - Excessive beta, generalized  IMPRESSION: This study is suggestive of moderate to severe diffuse encephalopathy, nonspecific etiology. No seizures or epileptiform discharges were seen throughout the recording.  Lissy Deuser 

## 2022-05-27 NOTE — Progress Notes (Addendum)
Neurology Progress Note  Brief HPI: 61 y.o. male with PMHx of EtOH abuse, SDH in August, seizure disorder, and HTN presenting 12/20 with sudden onset of GTC seizure-like activity or incontinence.  Witnessed seizure by EMS en route, and a third witnessed seizure in the ED by EDP and neurology.  No evidence of acute infection, UDS negative, CXR unremarkable, family endorses compliance with AED.  Patient was loaded with Keppra, placed on overnight EEG monitoring, loaded with Vimpat 200 mg IV, maintenance dose of Keppra was increased to 1500 mg twice daily.  CT head without acute intracranial hemorrhage and with stable encephalomalacia in the frontal and temporal lobes on the left.  EEG overnight no seizures past 22:30.  Subjective: Neurologic status improved today with patient at baseline.  Patient sitting up in bed, in no acute distress.  LTM monitoring in progress, no seizures or epileptiform discharges seen throughout the recording.   Exam: Vitals:   05/27/22 0400 05/27/22 0759  BP: 123/76 120/81  Pulse: 99 (!) 105  Resp: 16 17  Temp: 98.7 F (37.1 C) 98.5 F (36.9 C)  SpO2: 98% 96%   Gen: Sitting up in bed, NAD Resp: non-labored breathing, no acute distress, on room air Abd: soft, non-tender  Neuro: Mental Status: Awake, alert, back to baseline mental status.  Patient follows commands and answers all orientation questions appropriately.  Speech is intact without evidence of aphasia or dysarthria.  Cranial Nerves: PERRL, EOMI, tracks examiner, VFF, face is symmetric resting and with movement, facial sensation is intact and symmetric to light touch, hearing is intact to voice, phonation intact, tongue is midline, shoulders shrug symmetrically.  Motor: 5/5 strength throughout without unilateral weakness or asymmetry. Tone and bulk are normal.  Sensory:Sensation intact and symmetric to light touch throughout Gait: Deferred  Pertinent Labs: CBC    Component Value Date/Time   WBC 7.1  05/26/2022 0407   RBC 3.73 (L) 05/26/2022 0407   HGB 11.5 (L) 05/26/2022 0407   HCT 34.0 (L) 05/26/2022 0407   PLT 224 05/26/2022 0407   MCV 91.2 05/26/2022 0407   MCH 30.8 05/26/2022 0407   MCHC 33.8 05/26/2022 0407   RDW 16.0 (H) 05/26/2022 0407   LYMPHSABS 1.7 05/25/2022 2104   MONOABS 0.6 05/25/2022 2104   EOSABS 0.1 05/25/2022 2104   BASOSABS 0.0 05/25/2022 2104   CMP     Component Value Date/Time   NA 143 05/27/2022 0340   K 3.2 (L) 05/27/2022 0340   CL 107 05/27/2022 0340   CO2 25 05/27/2022 0340   GLUCOSE 100 (H) 05/27/2022 0340   BUN 7 05/27/2022 0340   CREATININE 0.91 05/27/2022 0340   CALCIUM 9.5 05/27/2022 0340   PROT 6.4 (L) 05/27/2022 0340   ALBUMIN 3.5 05/27/2022 0340   AST 17 05/27/2022 0340   ALT 17 05/27/2022 0340   ALKPHOS 74 05/27/2022 0340   BILITOT 0.6 05/27/2022 0340   GFRNONAA >60 05/27/2022 0340   GFRAA >60 12/06/2019 1819   Urinalysis    Component Value Date/Time   COLORURINE YELLOW 05/25/2022 1038   APPEARANCEUR CLEAR 05/25/2022 1038   LABSPEC 1.011 05/25/2022 1038   PHURINE 6.0 05/25/2022 1038   GLUCOSEU NEGATIVE 05/25/2022 1038   HGBUR NEGATIVE 05/25/2022 1038   BILIRUBINUR NEGATIVE 05/25/2022 1038   KETONESUR NEGATIVE 05/25/2022 1038   PROTEINUR NEGATIVE 05/25/2022 1038   NITRITE NEGATIVE 05/25/2022 1038   LEUKOCYTESUR NEGATIVE 05/25/2022 1038   Drugs of Abuse     Component Value Date/Time   LABOPIA NONE DETECTED  05/26/2022 1038   COCAINSCRNUR NONE DETECTED 05/26/2022 1038   LABBENZ POSITIVE (A) 05/26/2022 1038   AMPHETMU NONE DETECTED 05/26/2022 1038   THCU NONE DETECTED 05/26/2022 1038   LABBARB NONE DETECTED 05/26/2022 1038     Imaging Reviewed:  Overnight EEG with video 05/27/2022 0124 - 05/27/2022 0954: "This study is suggestive of moderate to severe diffuse encephalopathy, nonspecific etiology. No seizures or epileptiform discharges were seen throughout the recording."  CT head 12/20: no acute ICH, previously  described SDH is no longer seen.  Stable encephalomalacia in the frontal and temporal lobes on the left.  Impression: Eric Conley is a 61 y.o. male with a PMHx of seizures who presents with seizure flurry with unclear trigger.  Apparently has been taking his medications as prescribed and family has ensured that this is the case.  He does have a history of alcohol abuse, occasionally drinks but per family he is not drinking very much since staying with family since August.   Following examination by Neurology 12/20, he had a repeat episode of aphasia and stopped localizing that he was moving bilateral upper extremities--possibly that he had another unwitnessed seizure/subclinical seizure.     On exam 12/21, patient is restless, attempting to get out of bed, sometimes cooperative with exam and moving all extremities. On 12/22, neurologic status much improved back to his baseline. No seizures identified on EEG past 22:30.    Etiology of his lower seizure threshold is unclear. No fever, UA not concerning for UTI. UDS is negative.  CXR not concerning for pneumonia. Does have a history of SDH and Etoh use and prior seizures. Could just be breakthrough seizures.   Recommendations: - Continue maintenance Keppra 1,500 mg BID - continue Vimpat 100mg  BID - Discontinue LTM EEG  - Continue seizure precautions - Continue CIWA as necessary - okay to discahrge from a neurology standpoint. We will signoff.  , AGACNP-BC [facility] [phone]  NEUROHOSPITALIST ADDENDUM Performed a face to face diagnostic evaluation.   I have reviewed the contents of history and physical exam as documented by PA/ARNP/Resident and agree with above documentation.  I have discussed and formulated the above plan as documented. Edits to the note have been made as needed.  [phone], MD [facility] Erick Blinks   If 7pm to 7am, please call on call as listed on AMION.

## 2022-05-27 NOTE — Progress Notes (Signed)
PROGRESS NOTE    Eric Conley  [id number] DOB: [birth year] DOA: 05/25/2022 PCP: Sharilyn Sites, MD   Brief Narrative:  61 year old male with history of alcohol abuse in the past, seizure disorder, hypertension, subdural hematoma in August 2023 presented with seizures.  Neurology was consulted.  He was started on antiseizure medications and LTM EEG was ordered.  Assessment & Plan:   Seizures in a patient with history of seizure disorder -Neurology following.  Currently on Keppra and Vimpat.  LTM EEG ongoing.  CT head without contrast on presentation showed no acute intracranial abnormality -Fall precautions.  Seizure precautions.  PT eval  History of alcohol abuse -Alcohol level less than 10 on presentation.  Family reported no recent drinking. -Currently on CIWA protocol.  Continue thiamine, multivitamin and folic acid.  Hypokalemia -Replace.  Repeat a.m. labs  Normocytic anemia -Questionable cause.  Repeat a.m. labs.  No signs of bleeding  Acute metabolic acidosis -Resolved  Essential hypertension -Blood pressure on the lower side.  Monitor.  Physical deconditioning -PT eval   DVT prophylaxis: Lovenox Code Status: Full Family Communication: None at bedside Disposition Plan: Status is: Inpatient Remains inpatient appropriate because: Of severity of illness.  Need for PT evaluation and follow further recommendations from neurology    Consultants: Neurology  Procedures: LTM EEG  Antimicrobials: None   Subjective: Patient seen and examined at bedside.  More awake this morning and answers some questions.  No fever, vomiting, seizures reported.  Objective: Vitals:   05/27/22 0400 05/27/22 0500 05/27/22 0759 05/27/22 1145  BP: 123/76  120/81 114/81  Pulse: 99  (!) 105 (!) 117  Resp: 16  17 17   Temp: 98.7 F (37.1 C)  98.5 F (36.9 C) 98.7 F (37.1 C)  TempSrc: Oral  Oral Oral  SpO2: 98%  96% 96%  Weight:  66.8 kg    Height:       No intake or output  data in the 24 hours ending 05/27/22 1157 Filed Weights   05/26/22 0352 05/26/22 0354 05/27/22 0500  Weight: 67.4 kg 67.4 kg 66.8 kg    Examination:  General exam: Appears calm and comfortable.  On room air.  Slow to respond.  Undergoing LTM EEG Respiratory system: Bilateral decreased breath sounds at bases, no wheezing Cardiovascular system: S1 & S2 heard, mild intermittent tachycardia present gastrointestinal system: Abdomen is nondistended, soft and nontender. Normal bowel sounds heard. Extremities: No cyanosis, clubbing, edema  Central nervous system: Awake, slow to count.  Poor historian.  No focal neurological deficits. Moving extremities Skin: No rashes, lesions or ulcers Psychiatry: Flat affect.  No signs of agitation.    Data Reviewed: I have personally reviewed following labs and imaging studies  CBC: Recent Labs  Lab 05/25/22 2104 05/26/22 0407  WBC 5.9 7.1  NEUTROABS 3.4  --   HGB 13.2 11.5*  HCT 39.9 34.0*  MCV 93.4 91.2  PLT 256 [id number]   Basic Metabolic Panel: Recent Labs  Lab 05/25/22 2104 05/26/22 0407 05/27/22 0340  NA 141 139 143  K 3.8 3.5 3.2*  CL 103 105 107  CO2 15* 24 25  GLUCOSE 119* 97 100*  BUN 8 7 7   CREATININE 1.30* 0.89 0.91  CALCIUM 9.5 8.4* 9.5  MG 2.2  --  2.0   GFR: Estimated Creatinine Clearance: 81.7 mL/min (by C-G formula based on SCr of 0.91 mg/dL). Liver Function Tests: Recent Labs  Lab 05/25/22 2104 05/27/22 0340  AST 32 17  ALT 20 17  ALKPHOS 71  74  BILITOT 0.5 0.6  PROT 7.4 6.4*  ALBUMIN 4.0 3.5   No results for input(s): "LIPASE", "AMYLASE" in the last 168 hours. No results for input(s): "AMMONIA" in the last 168 hours. Coagulation Profile: Recent Labs  Lab 05/25/22 2104  INR 1.0   Cardiac Enzymes: No results for input(s): "CKTOTAL", "CKMB", "CKMBINDEX", "TROPONINI" in the last 168 hours. BNP (last 3 results) No results for input(s): "PROBNP" in the last 8760 hours. HbA1C: No results for input(s):  "HGBA1C" in the last 72 hours. CBG: Recent Labs  Lab 05/25/22 2103  GLUCAP 126*   Lipid Profile: No results for input(s): "CHOL", "HDL", "LDLCALC", "TRIG", "CHOLHDL", "LDLDIRECT" in the last 72 hours. Thyroid Function Tests: No results for input(s): "TSH", "T4TOTAL", "FREET4", "T3FREE", "THYROIDAB" in the last 72 hours. Anemia Panel: No results for input(s): "VITAMINB12", "FOLATE", "FERRITIN", "TIBC", "IRON", "RETICCTPCT" in the last 72 hours. Sepsis Labs: No results for input(s): "PROCALCITON", "LATICACIDVEN" in the last 168 hours.  No results found for this or any previous visit (from the past 240 hour(s)).       Radiology Studies: Overnight EEG with video  Result Date: 05/26/2022 Lora Havens, MD     05/27/2022 11:55 AM Patient Name: Eric Conley MRN: [id number] Epilepsy Attending: Lora Havens Referring Physician/Provider: Greta Doom, MD Duration: 05/26/2022 0124 to 05/27/2022 0124 Patient history: 61 year old male with a history of seizures who presents with seizure flurry of unclear etiology.  EEG to evaluate for seizure. Level of alertness: Awake, asleep AEDs during EEG study: LEV Technical aspects: This EEG study was done with scalp electrodes positioned according to the 10-20 International system of electrode placement. Electrical activity was reviewed with band pass filter of 1-70Hz , sensitivity of 7 uV/mm, display speed of 51mm/sec with a 60Hz  notched filter applied as appropriate. EEG data were recorded continuously and digitally stored.  Video monitoring was available and reviewed as appropriate. Description: The posterior dominant rhythm consists of 9-10 Hz activity of moderate voltage (25-35 uV) seen predominantly in posterior head regions, symmetric and reactive to eye opening and eye closing. Sleep was characterized by vertex waves, sleep spindles (12 to 14 Hz), maximal frontocentral region.  Six seizures without clinical signs were noted on 05/27/2022  at [place], [place], [place], [place], 1315, 1547. During seizure, EEG showed generalized maximal bifrontal sharply contoured 5 to 7 Hz theta slowing admixed with 2 to 3 Hz delta slowing with evolution in morphology and frequency. Average duration of seizures was around 30 minutes. Hyperventilation and photic stimulation were not performed.   ABNORMALITY -Seizure without clinical signs, generalized IMPRESSION: This study showed six seizures without clinical signs with generalized onset on 05/27/2022 at [place], [place], 0919, 1036, 1315 and 1547 lasting about 30 minutes each. As medications were adjusted, EEG improved and was subsequently within normal limits. Priyanka Barbra Sarks   CT HEAD WO CONTRAST  Result Date: 05/25/2022 CLINICAL DATA:  Head trauma, abnormal mental status.  Seizures. EXAM: CT HEAD WITHOUT CONTRAST CT CERVICAL SPINE WITHOUT CONTRAST TECHNIQUE: Multidetector CT imaging of the head and cervical spine was performed following the standard protocol without intravenous contrast. Multiplanar CT image reconstructions of the cervical spine were also generated. RADIATION DOSE REDUCTION: This exam was performed according to the departmental dose-optimization program which includes automated exposure control, adjustment of the mA and/or kV according to patient size and/or use of iterative reconstruction technique. COMPARISON:  03/24/2022. FINDINGS: CT HEAD FINDINGS Brain: No acute intracranial hemorrhage, midline shift or mass effect. No extra-axial fluid collection.  The previously described subdural hematoma is no longer seen. Periventricular white matter hypodensities are present bilaterally. Encephalomalacia is noted in the frontal and temporal lobes on the left, unchanged. No hydrocephalus. Mild atrophy is noted. Vascular: No hyperdense vessel or unexpected calcification. Skull: Normal. Negative for fracture or focal lesion. Sinuses/Orbits: There is partial opacification of the maxillary sinuses and ethmoid air cells.  No acute orbital abnormality. Other: None. CT CERVICAL SPINE FINDINGS Alignment: Mild retrolisthesis at C5-C6. Skull base and vertebrae: No acute fracture. No primary bone lesion or focal pathologic process. Soft tissues and spinal canal: No prevertebral fluid or swelling. No visible canal hematoma. Disc levels: Multilevel intervertebral disc space narrowing, uncovertebral osteophyte formation, and mild facet arthropathy. Upper chest: Emphysematous changes are noted at the lung apices. Other: Carotid artery calcifications. IMPRESSION: 1. No acute intracranial hemorrhage. The previously described subdural hematoma is no longer seen. 2. Mild atrophy with chronic microvascular ischemic changes. 3. Stable encephalomalacia in the frontal and temporal lobes on the left. 4. Multilevel degenerative changes in the cervical spine without evidence of acute fracture. 5. Emphysema. Electronically Signed   By: Brett Fairy M.D.   On: 05/25/2022 21:57   CT CERVICAL SPINE WO CONTRAST  Result Date: 05/25/2022 CLINICAL DATA:  Head trauma, abnormal mental status.  Seizures. EXAM: CT HEAD WITHOUT CONTRAST CT CERVICAL SPINE WITHOUT CONTRAST TECHNIQUE: Multidetector CT imaging of the head and cervical spine was performed following the standard protocol without intravenous contrast. Multiplanar CT image reconstructions of the cervical spine were also generated. RADIATION DOSE REDUCTION: This exam was performed according to the departmental dose-optimization program which includes automated exposure control, adjustment of the mA and/or kV according to patient size and/or use of iterative reconstruction technique. COMPARISON:  03/24/2022. FINDINGS: CT HEAD FINDINGS Brain: No acute intracranial hemorrhage, midline shift or mass effect. No extra-axial fluid collection. The previously described subdural hematoma is no longer seen. Periventricular white matter hypodensities are present bilaterally. Encephalomalacia is noted in the frontal  and temporal lobes on the left, unchanged. No hydrocephalus. Mild atrophy is noted. Vascular: No hyperdense vessel or unexpected calcification. Skull: Normal. Negative for fracture or focal lesion. Sinuses/Orbits: There is partial opacification of the maxillary sinuses and ethmoid air cells. No acute orbital abnormality. Other: None. CT CERVICAL SPINE FINDINGS Alignment: Mild retrolisthesis at C5-C6. Skull base and vertebrae: No acute fracture. No primary bone lesion or focal pathologic process. Soft tissues and spinal canal: No prevertebral fluid or swelling. No visible canal hematoma. Disc levels: Multilevel intervertebral disc space narrowing, uncovertebral osteophyte formation, and mild facet arthropathy. Upper chest: Emphysematous changes are noted at the lung apices. Other: Carotid artery calcifications. IMPRESSION: 1. No acute intracranial hemorrhage. The previously described subdural hematoma is no longer seen. 2. Mild atrophy with chronic microvascular ischemic changes. 3. Stable encephalomalacia in the frontal and temporal lobes on the left. 4. Multilevel degenerative changes in the cervical spine without evidence of acute fracture. 5. Emphysema. Electronically Signed   By: Brett Fairy M.D.   On: 05/25/2022 21:57        Scheduled Meds:  enoxaparin (LOVENOX) injection  40 mg Subcutaneous [id number]   folic acid  1 mg Oral Daily   multivitamin with minerals  1 tablet Oral Daily   potassium chloride  40 mEq Oral Q4H   thiamine  100 mg Oral Daily   Or   thiamine  100 mg Intravenous Daily   Continuous Infusions:  lacosamide (VIMPAT) IV 100 mg (05/27/22 1042)   levETIRAcetam 1,500 mg (05/27/22 0725)  Glade Lloyd, MD [facility] 05/27/2022, 11:57 AM

## 2022-05-28 ENCOUNTER — Inpatient Hospital Stay (HOSPITAL_COMMUNITY): Payer: 59

## 2022-05-28 DIAGNOSIS — R569 Unspecified convulsions: Secondary | ICD-10-CM | POA: Diagnosis not present

## 2022-05-28 DIAGNOSIS — G9341 Metabolic encephalopathy: Secondary | ICD-10-CM

## 2022-05-28 LAB — URINALYSIS, ROUTINE W REFLEX MICROSCOPIC
Bilirubin Urine: NEGATIVE
Glucose, UA: NEGATIVE mg/dL
Hgb urine dipstick: NEGATIVE
Ketones, ur: 5 mg/dL — AB
Leukocytes,Ua: NEGATIVE
Nitrite: NEGATIVE
Protein, ur: NEGATIVE mg/dL
Specific Gravity, Urine: 1.014 (ref 1.005–1.030)
pH: 8 (ref 5.0–8.0)

## 2022-05-28 LAB — COMPREHENSIVE METABOLIC PANEL
ALT: 18 U/L (ref 0–44)
AST: 17 U/L (ref 15–41)
Albumin: 3.8 g/dL (ref 3.5–5.0)
Alkaline Phosphatase: 75 U/L (ref 38–126)
Anion gap: 9 (ref 5–15)
BUN: 8 mg/dL (ref 6–20)
CO2: 23 mmol/L (ref 22–32)
Calcium: 9.7 mg/dL (ref 8.9–10.3)
Chloride: 103 mmol/L (ref 98–111)
Creatinine, Ser: 0.86 mg/dL (ref 0.61–1.24)
GFR, Estimated: >60 mL/min (ref >60)
Glucose, Bld: 142 mg/dL — ABNORMAL HIGH (ref 70–99)
Potassium: 4.3 mmol/L (ref 3.5–5.1)
Sodium: 135 mmol/L (ref 135–145)
Total Bilirubin: 0.6 mg/dL (ref 0.3–1.2)
Total Protein: 6.9 g/dL (ref 6.5–8.1)

## 2022-05-28 LAB — LACTIC ACID, PLASMA
Lactic Acid, Venous: 1 mmol/L (ref 0.5–1.9)
Lactic Acid, Venous: 2.9 mmol/L (ref 0.5–1.9)
Lactic Acid, Venous: 2.7 mmol/L (ref 0.5–1.9)

## 2022-05-28 LAB — CBC WITH DIFFERENTIAL/PLATELET
Abs Immature Granulocytes: 0.04 10*3/uL (ref 0.00–0.07)
Basophils Absolute: 0 10*3/uL (ref 0.0–0.1)
Basophils Relative: 0 %
Eosinophils Absolute: 0 10*3/uL (ref 0.0–0.5)
Eosinophils Relative: 0 %
HCT: 37.6 % — ABNORMAL LOW (ref 39.0–52.0)
Hemoglobin: 12.6 g/dL — ABNORMAL LOW (ref 13.0–17.0)
Immature Granulocytes: 0 %
Lymphocytes Relative: 6 %
Lymphs Abs: 0.5 10*3/uL — ABNORMAL LOW (ref 0.7–4.0)
MCH: 30.5 pg (ref 26.0–34.0)
MCHC: 33.5 g/dL (ref 30.0–36.0)
MCV: 91 fL (ref 80.0–100.0)
Monocytes Absolute: 0.9 10*3/uL (ref 0.1–1.0)
Monocytes Relative: 9 %
Neutro Abs: 7.9 10*3/uL — ABNORMAL HIGH (ref 1.7–7.7)
Neutrophils Relative %: 85 %
Platelets: 228 10*3/uL (ref 150–400)
RBC: 4.13 MIL/uL — ABNORMAL LOW (ref 4.22–5.81)
RDW: 16.1 % — ABNORMAL HIGH (ref 11.5–15.5)
WBC: 9.4 10*3/uL (ref 4.0–10.5)
nRBC: 0 % (ref 0.0–0.2)

## 2022-05-28 LAB — BLOOD CULTURE ID PANEL (REFLEXED) - BCID2

## 2022-05-28 LAB — MAGNESIUM: Magnesium: 1.5 mg/dL — ABNORMAL LOW (ref 1.7–2.4)

## 2022-05-28 LAB — PROTIME-INR
INR: 1 (ref 0.8–1.2)
Prothrombin Time: 13.1 seconds (ref 11.4–15.2)

## 2022-05-28 LAB — PROCALCITONIN: Procalcitonin: <0.1 ng/mL

## 2022-05-28 LAB — APTT: aPTT: 25 seconds (ref 24–36)

## 2022-05-28 MED ORDER — SODIUM CHLORIDE 0.9 % IV SOLN
2.0000 g | Freq: Once | INTRAVENOUS | Status: AC
  Administered 2022-05-28: 2 g via INTRAVENOUS
  Filled 2022-05-28: qty 12.5

## 2022-05-28 MED ORDER — SODIUM CHLORIDE 0.9 % IV SOLN
2.0000 g | Freq: Three times a day (TID) | INTRAVENOUS | Status: DC
  Administered 2022-05-28: 2 g via INTRAVENOUS
  Filled 2022-05-28: qty 12.5

## 2022-05-28 MED ORDER — VANCOMYCIN HCL 750 MG/150ML IV SOLN
750.0000 mg | Freq: Two times a day (BID) | INTRAVENOUS | Status: DC
  Filled 2022-05-28: qty 150

## 2022-05-28 MED ORDER — VANCOMYCIN HCL 1250 MG/250ML IV SOLN
1250.0000 mg | Freq: Once | INTRAVENOUS | Status: DC
  Filled 2022-05-28: qty 250

## 2022-05-28 MED ORDER — DILTIAZEM HCL ER COATED BEADS 120 MG PO CP24
120.0000 mg | ORAL_CAPSULE | Freq: Every day | ORAL | Status: DC
  Administered 2022-05-28 – 2022-05-30 (×3): 120 mg via ORAL
  Filled 2022-05-28 (×4): qty 1

## 2022-05-28 MED ORDER — SODIUM CHLORIDE 0.9 % IV BOLUS (SEPSIS)
250.0000 mL | Freq: Once | INTRAVENOUS | Status: AC
  Administered 2022-05-28: 250 mL via INTRAVENOUS

## 2022-05-28 MED ORDER — CEFAZOLIN SODIUM-DEXTROSE 2-4 GM/100ML-% IV SOLN
2.0000 g | Freq: Three times a day (TID) | INTRAVENOUS | Status: DC
  Administered 2022-05-28 – 2022-06-02 (×15): 2 g via INTRAVENOUS
  Filled 2022-05-28 (×15): qty 100

## 2022-05-28 MED ORDER — METRONIDAZOLE 500 MG/100ML IV SOLN
500.0000 mg | Freq: Two times a day (BID) | INTRAVENOUS | Status: DC
  Administered 2022-05-28 – 2022-05-29 (×3): 500 mg via INTRAVENOUS
  Filled 2022-05-28 (×3): qty 100

## 2022-05-28 MED ORDER — DILTIAZEM HCL ER COATED BEADS 120 MG PO CP24: 120.0000 mg | ORAL_CAPSULE | Freq: Every day | ORAL | Status: DC

## 2022-05-28 MED ORDER — SODIUM CHLORIDE 0.9 % IV BOLUS (SEPSIS)
1000.0000 mL | Freq: Once | INTRAVENOUS | Status: AC
  Administered 2022-05-28: 1000 mL via INTRAVENOUS

## 2022-05-28 MED ORDER — MAGNESIUM SULFATE 2 GM/50ML IV SOLN
2.0000 g | Freq: Once | INTRAVENOUS | Status: AC
  Administered 2022-05-28: 2 g via INTRAVENOUS
  Filled 2022-05-28: qty 50

## 2022-05-28 MED ORDER — DIPHENHYDRAMINE HCL 50 MG/ML IJ SOLN
25.0000 mg | Freq: Once | INTRAMUSCULAR | Status: AC
  Administered 2022-05-28: 25 mg via INTRAVENOUS
  Filled 2022-05-28: qty 1

## 2022-05-28 NOTE — Progress Notes (Signed)
Overnight progress note  Patient admitted for seizures.  He is febrile and tachycardic.  Most recent vital signs: Temperature 100.9 F, pulse 133, respiratory rate 16, blood pressure 143/84, SpO2 97% on room air.  -Tylenol as needed for fevers -Start broad-spectrum antibiotics given concern for sepsis -30 cc/kg fluid boluses per sepsis protocol.  Echo done in October 2023 showing normal EF. -CBC, CMP, lactate, PT/INR, APTT, procalcitonin, UA, chest x-ray, blood cultures

## 2022-05-28 NOTE — Progress Notes (Signed)
PT Cancellation Note  Patient Details Name: Eric Conley MRN: [id number] DOB: 1960-09-18   Cancelled Treatment:    Reason Eval/Treat Not Completed: Patient not medically ready  Discussed with RN. Pt currently sleeping after dose of ativan at 0600 due to agitation. Remains in restraints. Will monitor for appropriateness for PT evaluation.    Jerolyn Center, PT Acute Rehabilitation Services  Office [phone]' Zena Amos 05/28/2022, 10:25 AM

## 2022-05-28 NOTE — Progress Notes (Signed)
PROGRESS NOTE    Eric Conley  [id number] DOB: 31-Mar-1961 DOA: 05/25/2022 PCP: Sharilyn Sites, MD   Brief Narrative:  61 year old male with history of alcohol abuse in the past, seizure disorder, hypertension, subdural hematoma in August 2023 presented with seizures.  Neurology was consulted.  He was started on antiseizure medications and LTM EEG was ordered.  Patient subsequently went into alcohol withdrawal.  Also developed fever of unclear etiology.  Assessment & Plan:   Seizures in a patient with history of seizure disorder -Neurology following.  Currently on Keppra and Vimpat.   LTM has been discontinued.   Acute encephalopathy Likely multifactorial including seizures as well as alcohol withdrawal.    Alcohol withdrawal/history of alcohol abuse  -Alcohol level less than 10 on presentation.  Family reported no recent drinking. -Currently on CIWA protocol.  Continue thiamine, multivitamin and folic acid.  Fever Developed fever overnight.  Had elevated lactic acid level.  Procalcitonin less than 0.1.  Patient started on broad-spectrum antibiotics.  Will recheck procalcitonin tomorrow.  Follow-up on blood cultures.  UA was unremarkable.  Chest x-ray does not show any acute findings.  Continue to monitor closely.  Hypokalemia/hypomagnesemia Replaced.  Magnesium 1.5.  This will also be supplemented.  Normocytic anemia Hemoglobin is stable.  No evidence of overt bleeding.  Acute metabolic acidosis -Resolved  Essential hypertension Blood pressure reason bleed well-controlled.  Noted to be on diltiazem.  DVT prophylaxis: Lovenox Code Status: Full Family Communication: None at bedside Disposition Plan: To be determined  Status is: Inpatient Remains inpatient appropriate because: Encephalopathy, seizures  Consultants: Neurology  Procedures: LTM EEG  Antimicrobials: None   Subjective: Patient sleepy but arousable.  Somewhat confused and disoriented.  Denies any  pain.    Objective: Vitals:   05/28/22 0418 05/28/22 0500 05/28/22 0517 05/28/22 0729  BP: (!) 144/84  121/81 121/82  Pulse: (!) 124  (!) 109   Resp: 20  20 20   Temp: 99.5 F (37.5 C)  99.2 F (37.3 C) 99.7 F (37.6 C)  TempSrc: Oral  Oral Oral  SpO2: 100%  98% 98%  Weight:  67.7 kg    Height:        Intake/Output Summary (Last 24 hours) at 05/28/2022 1054 Last data filed at 05/28/2022 0948 Gross per 24 hour  Intake 539.92 ml  Output 1175 ml  Net -635.08 ml   Filed Weights   05/26/22 0354 05/27/22 0500 05/28/22 0500  Weight: 67.4 kg 66.8 kg 67.7 kg    Examination:  General appearance: Distracted.  No distress. Resp: Clear to auscultation bilaterally.  Normal effort Cardio: S1-S2 is normal regular.  No S3-S4.  No rubs murmurs or bruit GI: Abdomen is soft.  Nontender nondistended.  Bowel sounds are present normal.  No masses organomegaly Extremities: No edema.  Moving all of his extremities. Neurologic: Disoriented..  No obvious focal neurological deficits.    Data Reviewed: I have personally reviewed following labs and imaging studies  CBC: Recent Labs  Lab 05/25/22 2104 05/26/22 0407 05/28/22 0231  WBC 5.9 7.1 9.4  NEUTROABS 3.4  --  7.9*  HGB 13.2 11.5* 12.6*  HCT 39.9 34.0* 37.6*  MCV 93.4 91.2 91.0  PLT 256 224 [id number]    Basic Metabolic Panel: Recent Labs  Lab 05/25/22 2104 05/26/22 0407 05/27/22 0340 05/28/22 0231  NA 141 139 143 135  K 3.8 3.5 3.2* 4.3  CL 103 105 107 103  CO2 15* 24 25 23   GLUCOSE 119* 97 100* 142*  BUN  8 7 7 8   CREATININE 1.30* 0.89 0.91 0.86  CALCIUM 9.5 8.4* 9.5 9.7  MG 2.2  --  2.0 1.5*    GFR: Estimated Creatinine Clearance: 86.5 mL/min (by C-G formula based on SCr of 0.86 mg/dL). Liver Function Tests: Recent Labs  Lab 05/25/22 2104 05/27/22 0340 05/28/22 0231  AST 32 17 17  ALT 20 17 18   ALKPHOS 71 74 75  BILITOT 0.5 0.6 0.6  PROT 7.4 6.4* 6.9  ALBUMIN 4.0 3.5 3.8    Coagulation Profile: Recent Labs   Lab 05/25/22 2104 05/28/22 0231  INR 1.0 1.0     CBG: Recent Labs  Lab 05/25/22 2103  GLUCAP 126*     Sepsis Labs: Recent Labs  Lab 05/28/22 0231 05/28/22 0346 05/28/22 0749  PROCALCITON <0.10  --   --   LATICACIDVEN 1.0 2.9* 2.7*    Recent Results (from the past 240 hour(s))  Culture, blood (x 2)     Status: None (Preliminary result)   Collection Time: 05/28/22  2:29 AM   Specimen: BLOOD  Result Value Ref Range Status   Specimen Description BLOOD SITE NOT SPECIFIED  Final   Special Requests   Final    BOTTLES DRAWN AEROBIC AND ANAEROBIC Blood Culture results may not be optimal due to an excessive volume of blood received in culture bottles   Culture   Final    NO GROWTH < 12 HOURS Performed at [facility], [address], [place], [address] [place]    Report Status PENDING  Incomplete  Culture, blood (x 2)     Status: None (Preliminary result)   Collection Time: 05/28/22  2:31 AM   Specimen: BLOOD  Result Value Ref Range Status   Specimen Description BLOOD SITE NOT SPECIFIED  Final   Special Requests   Final    BOTTLES DRAWN AEROBIC AND ANAEROBIC Blood Culture results may not be optimal due to an excessive volume of blood received in culture bottles   Culture   Final    NO GROWTH < 12 HOURS Performed at [facility], [address], [place], [address] [place]    Report Status PENDING  Incomplete         Radiology Studies: DG Chest Port 1 View  Result Date: 05/28/2022 CLINICAL DATA:  Sepsis EXAM: PORTABLE CHEST 1 VIEW COMPARISON:  Radiographs 03/24/2022 FINDINGS: Stable cardiomediastinal silhouette. No focal consolidation, pleural effusion, or pneumothorax. No acute osseous abnormality. IMPRESSION: No active disease. Electronically Signed   By: 05/30/2022 M.D.   On: 05/28/2022 02:43      Scheduled Meds:  diltiazem  120 mg Oral Daily   enoxaparin (LOVENOX) injection  40 mg Subcutaneous Q24H   folic acid  1 mg Oral Daily    multivitamin with minerals  1 tablet Oral Daily   thiamine  100 mg Oral Daily   Or   thiamine  100 mg Intravenous Daily   Continuous Infusions:  ceFEPime (MAXIPIME) IV     lacosamide (VIMPAT) IV 100 mg (05/28/22 1044)   levETIRAcetam 1,500 mg (05/28/22 0615)   magnesium sulfate bolus IVPB     metronidazole 500 mg (05/28/22 0416)   vancomycin 83.3 mL/hr at 05/28/22 0639   vancomycin        05/30/22, MD [facility] 05/28/2022, 10:54 AM

## 2022-05-28 NOTE — Progress Notes (Signed)
PT Cancellation Note  Patient Details Name: TAQUAN BRALLEY MRN: [id number] DOB: 1960/07/08   Cancelled Treatment:    Reason Eval/Treat Not Completed: Patient not medically ready  Discussed with RN and pt remains too lethargic to participate in PT evaluation. Will follow and proceed when appropriate.   Jerolyn Center, PT Acute Rehabilitation Services  Office [phone]   Zena Amos 05/28/2022, 3:57 PM

## 2022-05-28 NOTE — Progress Notes (Signed)
   05/28/22 1107  Assess: MEWS Score  Temp 100 F (37.8 C)  BP 123/77  MAP (mmHg) 90  Pulse Rate (!) 118  Resp 16  SpO2 98 %  Assess: MEWS Score  MEWS Temp 0  MEWS Systolic 0  MEWS Pulse 2  MEWS RR 0  MEWS LOC 0  MEWS Score 2  MEWS Score Color Yellow  Assess: if the MEWS score is Yellow or Red  Were vital signs taken at a resting state? Yes  Focused Assessment Change from prior assessment (see assessment flowsheet)  Does the patient meet 2 or more of the SIRS criteria? No  Does the patient have a confirmed or suspected source of infection? Yes  Provider and Rapid Response Notified? Yes  MEWS guidelines implemented *See Row Information* Yes  Take Vital Signs  Increase Vital Sign Frequency  Yellow: Q 2hr X 2 then Q 4hr X 2, if remains yellow, continue Q 4hrs  Escalate  MEWS: Escalate Yellow: discuss with charge nurse/RN and consider discussing with provider and RRT  Notify: Charge Nurse/RN  Name of Charge Nurse/RN Notified Marylene Land  Date Charge Nurse/RN Notified 05/28/22  Provider Notification  Provider response See new orders  Document  Patient Outcome Stabilized after interventions  Progress note created (see row info) Yes  Assess: SIRS CRITERIA  SIRS Temperature  0  SIRS Pulse 1  SIRS Respirations  0  SIRS WBC 0  SIRS Score Sum  1

## 2022-05-28 NOTE — Progress Notes (Signed)
Pharmacy Antibiotic Note  Eric Conley is a 61 y.o. male admitted on 05/25/2022 with seziures, now febrile and tachycardic >> concern for sepsis.  Pharmacy has been consulted for vancomycin and cefepime dosing.  Plan: Vancomycin 1250mg  IV x1 then 750mg  IV Q8H. Goal AUC 400-550.  Expected AUC 430. Cefepime 2g IV Q8H.  Height: 5\' 7"  (170.2 cm) Weight: 66.8 kg (147 lb 4.3 oz) IBW/kg (Calculated) : 66.1  Temp (24hrs), Avg:99 F (37.2 C), Min:98.5 F (36.9 C), Max:100.9 F (38.3 C)  Recent Labs  Lab 05/25/22 2104 05/26/22 0407 05/27/22 0340  WBC 5.9 7.1  --   CREATININE 1.30* 0.89 0.91    Estimated Creatinine Clearance: 81.7 mL/min (by C-G formula based on SCr of 0.91 mg/dL).    Allergies  Allergen Reactions   Codeine    Poison Sumac Extract     Thank you for allowing pharmacy to be a part of this patient's care.  2105, PharmD, BCPS  05/28/2022 2:27 AM

## 2022-05-28 NOTE — Progress Notes (Signed)
PHARMACY - PHYSICIAN COMMUNICATION CRITICAL VALUE ALERT - BLOOD CULTURE IDENTIFICATION (BCID)  Eric Conley is an 61 y.o. male who presented to [facility] on 05/25/2022 with a chief complaint of seizures  Assessment:  Bcx growing GPC in chains/clusters in 4/4 bottles - BCID showing staph aureus with no resistance pattern.  Name of physician (or Provider) Contacted: Dr Rito Ehrlich  Current antibiotics: cefepime and vancomycin  Changes to prescribed antibiotics recommended:  Change to cefazolin 2g IV every 8 hours - ID will be consulted given Bcx results.  Results for orders placed or performed during the hospital encounter of 05/25/22  Blood Culture ID Panel (Reflexed) (Collected: 05/28/2022  2:29 AM)  Result Value Ref Range   Enterococcus faecalis NOT DETECTED NOT DETECTED   Enterococcus Faecium NOT DETECTED NOT DETECTED   Listeria monocytogenes NOT DETECTED NOT DETECTED   Staphylococcus species DETECTED (A) NOT DETECTED   Staphylococcus aureus (BCID) DETECTED (A) NOT DETECTED   Staphylococcus epidermidis NOT DETECTED NOT DETECTED   Staphylococcus lugdunensis NOT DETECTED NOT DETECTED   Streptococcus species NOT DETECTED NOT DETECTED   Streptococcus agalactiae NOT DETECTED NOT DETECTED   Streptococcus pneumoniae NOT DETECTED NOT DETECTED   Streptococcus pyogenes NOT DETECTED NOT DETECTED   A.calcoaceticus-baumannii NOT DETECTED NOT DETECTED   Bacteroides fragilis NOT DETECTED NOT DETECTED   Enterobacterales NOT DETECTED NOT DETECTED   Enterobacter cloacae complex NOT DETECTED NOT DETECTED   Escherichia coli NOT DETECTED NOT DETECTED   Klebsiella aerogenes NOT DETECTED NOT DETECTED   Klebsiella oxytoca NOT DETECTED NOT DETECTED   Klebsiella pneumoniae NOT DETECTED NOT DETECTED   Proteus species NOT DETECTED NOT DETECTED   Salmonella species NOT DETECTED NOT DETECTED   Serratia marcescens NOT DETECTED NOT DETECTED   Haemophilus influenzae NOT DETECTED NOT DETECTED   Neisseria  meningitidis NOT DETECTED NOT DETECTED   Pseudomonas aeruginosa NOT DETECTED NOT DETECTED   Stenotrophomonas maltophilia NOT DETECTED NOT DETECTED   Candida albicans NOT DETECTED NOT DETECTED   Candida auris NOT DETECTED NOT DETECTED   Candida glabrata NOT DETECTED NOT DETECTED   Candida krusei NOT DETECTED NOT DETECTED   Candida parapsilosis NOT DETECTED NOT DETECTED   Candida tropicalis NOT DETECTED NOT DETECTED   Cryptococcus neoformans/gattii NOT DETECTED NOT DETECTED   Meth resistant mecA/C and MREJ NOT DETECTED NOT DETECTED    Sherron Monday, PharmD, BCCCP Clinical Pharmacist  Phone: [phone] 05/28/2022 4:52 PM  Please check AMION for all [facility] phone numbers After 10:00 PM, call Main Pharmacy [phone]

## 2022-05-29 DIAGNOSIS — R569 Unspecified convulsions: Secondary | ICD-10-CM | POA: Diagnosis not present

## 2022-05-29 DIAGNOSIS — R7881 Bacteremia: Secondary | ICD-10-CM | POA: Diagnosis not present

## 2022-05-29 DIAGNOSIS — B9561 Methicillin susceptible Staphylococcus aureus infection as the cause of diseases classified elsewhere: Secondary | ICD-10-CM

## 2022-05-29 DIAGNOSIS — G9341 Metabolic encephalopathy: Secondary | ICD-10-CM | POA: Diagnosis not present

## 2022-05-29 LAB — COMPREHENSIVE METABOLIC PANEL
ALT: 54 U/L — ABNORMAL HIGH (ref 0–44)
AST: 64 U/L — ABNORMAL HIGH (ref 15–41)
Albumin: 3.5 g/dL (ref 3.5–5.0)
Alkaline Phosphatase: 72 U/L (ref 38–126)
Anion gap: 9 (ref 5–15)
BUN: 5 mg/dL — ABNORMAL LOW (ref 6–20)
CO2: 22 mmol/L (ref 22–32)
Calcium: 9.2 mg/dL (ref 8.9–10.3)
Chloride: 100 mmol/L (ref 98–111)
Creatinine, Ser: 0.94 mg/dL (ref 0.61–1.24)
GFR, Estimated: >60 mL/min (ref >60)
Glucose, Bld: 118 mg/dL — ABNORMAL HIGH (ref 70–99)
Potassium: 4.1 mmol/L (ref 3.5–5.1)
Sodium: 131 mmol/L — ABNORMAL LOW (ref 135–145)
Total Bilirubin: 0.6 mg/dL (ref 0.3–1.2)
Total Protein: 6.7 g/dL (ref 6.5–8.1)

## 2022-05-29 LAB — CBC
HCT: 38.1 % — ABNORMAL LOW (ref 39.0–52.0)
Hemoglobin: 13.1 g/dL (ref 13.0–17.0)
MCH: 30.5 pg (ref 26.0–34.0)
MCHC: 34.4 g/dL (ref 30.0–36.0)
MCV: 88.8 fL (ref 80.0–100.0)
Platelets: 192 10*3/uL (ref 150–400)
RBC: 4.29 MIL/uL (ref 4.22–5.81)
RDW: 16.1 % — ABNORMAL HIGH (ref 11.5–15.5)
WBC: 3.1 10*3/uL — ABNORMAL LOW (ref 4.0–10.5)
nRBC: 0 % (ref 0.0–0.2)

## 2022-05-29 LAB — MAGNESIUM: Magnesium: 1.8 mg/dL (ref 1.7–2.4)

## 2022-05-29 LAB — PROCALCITONIN: Procalcitonin: 0.37 ng/mL

## 2022-05-29 MED ORDER — METOPROLOL TARTRATE 25 MG PO TABS
25.0000 mg | ORAL_TABLET | Freq: Two times a day (BID) | ORAL | Status: DC
  Administered 2022-05-29 – 2022-06-02 (×9): 25 mg via ORAL
  Filled 2022-05-29 (×9): qty 1

## 2022-05-29 MED ORDER — DIPHENHYDRAMINE HCL 25 MG PO CAPS: 25.0000 mg | ORAL_CAPSULE | Freq: Once | ORAL | Status: DC | PRN

## 2022-05-29 NOTE — Progress Notes (Addendum)
PROGRESS NOTE    Eric Conley  TMH:[id number] DOB: 1961/01/17 DOA: 05/25/2022 PCP: Assunta Found, MD   Brief Narrative:  61 year old male with history of alcohol abuse in the past, seizure disorder, hypertension, subdural hematoma in August 2023 presented with seizures.  Neurology was consulted.  He was started on antiseizure medications and LTM EEG was ordered.  Patient subsequently went into alcohol withdrawal.  Subsequent the patient developed fever.  Blood cultures growing Staph aureus.  Assessment & Plan:   Seizures in a patient with history of seizure disorder Patient was seen by neurology.  Noted to be on Keppra and Vimpat.  LTM has been discontinued.  No further obvious seizure activity noted.    Staph aureus bacteremia Developed fever on 12/22.  Had elevated lactic acid level.  Procalcitonin less than 0.1.  Patient started on broad-spectrum antibiotics.   Procalcitonin noted to be slightly higher today at 0.3. Blood cultures growing Staph aureus.  Antibiotics were changed over to cefazolin. ID to see. Patient also noted to be on Flagyl which can likely be discontinued as well.   UA was unremarkable.  Chest x-ray does not show any acute findings.  Continue to monitor closely. Source of bacteremia is not clear.  No obvious skin rashes noted.  Patient denies any joint pains.  No recent procedures. Will likely need TTE versus TEE.  Will wait for ID input first.  Acute encephalopathy Likely multifactorial including seizures as well as alcohol withdrawal.   He was to be gradually improving.  Alcohol withdrawal/history of alcohol abuse  -Alcohol level less than 10 on presentation.  Family reported no recent drinking. -Currently on CIWA protocol.  Continue thiamine, multivitamin and folic acid.  Mild transaminitis Mildly elevated AST ALT noted.  Recheck labs tomorrow.  Hepatitis panel.  Hypokalemia/hypomagnesemia/hyponatremia Supplemented.  Monitor periodically. Monitor sodium  levels closely  Normocytic anemia Hemoglobin is stable.  No evidence of overt bleeding.  Acute metabolic acidosis Resolved  Essential hypertension Blood pressure reasonably well-controlled.  Sinus tachycardia could be rebound tachycardia since patient was on metoprolol prior to admission and is currently on hold.  Metoprolol to be resumed.    DVT prophylaxis: Lovenox Code Status: Full Family Communication: None at bedside Disposition Plan: To be determined  Status is: Inpatient Remains inpatient appropriate because: Encephalopathy, seizures  Consultants: Neurology  Procedures: LTM EEG  Antimicrobials: None   Subjective: Patient noted to be more alert today.  Denies any pain issues.  No joint pains.  No abdominal pain.  No nausea or vomiting.  No skin rashes.  Objective: Vitals:   05/28/22 2101 05/28/22 2331 05/29/22 0436 05/29/22 0735  BP: 130/81 127/74 116/78 116/87  Pulse: (!) 118 (!) 120 (!) 128 (!) 120  Resp: 20 20 18 19   Temp: (!) 100.7 F (38.2 C) 99.3 F (37.4 C) 99.9 F (37.7 C) 98.6 F (37 C)  TempSrc: Oral Oral Oral Oral  SpO2: 96% 96% 97% 96%  Weight:      Height:        Intake/Output Summary (Last 24 hours) at 05/29/2022 1000 Last data filed at 05/29/2022 0559 Gross per 24 hour  Intake 983.18 ml  Output 1550 ml  Net -566.82 ml    Filed Weights   05/26/22 0354 05/27/22 0500 05/28/22 0500  Weight: 67.4 kg 66.8 kg 67.7 kg    Examination:  General appearance: Awake alert.  In no distress Resp: Clear to auscultation bilaterally.  Normal effort Cardio: S1-S2 is normal regular.  No S3-S4.  No  rubs murmurs or bruit GI: Abdomen is soft.  Nontender nondistended.  Bowel sounds are present normal.  No masses organomegaly Extremities: No edema.  Full range of motion of lower extremities. Neurologic: No obvious focal neurological deficits noted.  Was able to tell me he was at [facility].  Knew the month.    Data Reviewed: I have personally reviewed  following labs and imaging studies  CBC: Recent Labs  Lab 05/25/22 2104 05/26/22 0407 05/28/22 0231 05/29/22 0255  WBC 5.9 7.1 9.4 3.1*  NEUTROABS 3.4  --  7.9*  --   HGB 13.2 11.5* 12.6* 13.1  HCT 39.9 34.0* 37.6* 38.1*  MCV 93.4 91.2 91.0 88.8  PLT 256 224 228 192    Basic Metabolic Panel: Recent Labs  Lab 05/25/22 2104 05/26/22 0407 05/27/22 0340 05/28/22 0231 05/29/22 0255  NA 141 139 143 135 131*  K 3.8 3.5 3.2* 4.3 4.1  CL 103 105 107 103 100  CO2 15* 24 25 23 22   GLUCOSE 119* 97 100* 142* 118*  BUN 8 7 7 8  5*  CREATININE 1.30* 0.89 0.91 0.86 0.94  CALCIUM 9.5 8.4* 9.5 9.7 9.2  MG 2.2  --  2.0 1.5* 1.8    GFR: Estimated Creatinine Clearance: 79.1 mL/min (by C-G formula based on SCr of 0.94 mg/dL). Liver Function Tests: Recent Labs  Lab 05/25/22 2104 05/27/22 0340 05/28/22 0231 05/29/22 0255  AST 32 17 17 64*  ALT 20 17 18  54*  ALKPHOS 71 74 75 72  BILITOT 0.5 0.6 0.6 0.6  PROT 7.4 6.4* 6.9 6.7  ALBUMIN 4.0 3.5 3.8 3.5    Coagulation Profile: Recent Labs  Lab 05/25/22 2104 05/28/22 0231  INR 1.0 1.0     CBG: Recent Labs  Lab 05/25/22 2103  GLUCAP 126*     Sepsis Labs: Recent Labs  Lab 05/28/22 0231 05/28/22 0346 05/28/22 0749 05/29/22 0255  PROCALCITON <0.10  --   --  0.37  LATICACIDVEN 1.0 2.9* 2.7*  --      Recent Results (from the past 240 hour(s))  Culture, blood (x 2)     Status: Abnormal (Preliminary result)   Collection Time: 05/28/22  2:29 AM   Specimen: BLOOD  Result Value Ref Range Status   Specimen Description BLOOD SITE NOT SPECIFIED  Final   Special Requests   Final    BOTTLES DRAWN AEROBIC AND ANAEROBIC Blood Culture results may not be optimal due to an excessive volume of blood received in culture bottles   Culture  Setup Time   Final    IN BOTH AEROBIC AND ANAEROBIC BOTTLES GRAM POSITIVE COCCI IN CHAINS IN CLUSTERS Organism ID to follow CRITICAL RESULT CALLED TO, READ BACK BY AND VERIFIED WITH:  C/  PHARMD K. HURTH 05/28/22 1646 A. LAFRANCE    Culture (A)  Final    STAPHYLOCOCCUS AUREUS SUSCEPTIBILITIES TO FOLLOW Performed at [facility], [address] [place], [facility] [address]    Report Status PENDING  Incomplete  Blood Culture ID Panel (Reflexed)     Status: Abnormal   Collection Time: 05/28/22  2:29 AM  Result Value Ref Range Status   Enterococcus faecalis NOT DETECTED NOT DETECTED Final   Enterococcus Faecium NOT DETECTED NOT DETECTED Final   Listeria monocytogenes NOT DETECTED NOT DETECTED Final   Staphylococcus species DETECTED (A) NOT DETECTED Final    Comment: CRITICAL RESULT CALLED TO, READ BACK BY AND VERIFIED WITH:  C/ PHARMD K. HURTH 05/28/22 1646 A. LAFRANCE  Staphylococcus aureus (BCID) DETECTED (A) NOT DETECTED Final    Comment: CRITICAL RESULT CALLED TO, READ BACK BY AND VERIFIED WITH:  C/ PHARMD K. HURTH 05/28/22 1646 A. LAFRANCE    Staphylococcus epidermidis NOT DETECTED NOT DETECTED Final   Staphylococcus lugdunensis NOT DETECTED NOT DETECTED Final   Streptococcus species NOT DETECTED NOT DETECTED Final   Streptococcus agalactiae NOT DETECTED NOT DETECTED Final   Streptococcus pneumoniae NOT DETECTED NOT DETECTED Final   Streptococcus pyogenes NOT DETECTED NOT DETECTED Final   A.calcoaceticus-baumannii NOT DETECTED NOT DETECTED Final   Bacteroides fragilis NOT DETECTED NOT DETECTED Final   Enterobacterales NOT DETECTED NOT DETECTED Final   Enterobacter cloacae complex NOT DETECTED NOT DETECTED Final   Escherichia coli NOT DETECTED NOT DETECTED Final   Klebsiella aerogenes NOT DETECTED NOT DETECTED Final   Klebsiella oxytoca NOT DETECTED NOT DETECTED Final   Klebsiella pneumoniae NOT DETECTED NOT DETECTED Final   Proteus species NOT DETECTED NOT DETECTED Final   Salmonella species NOT DETECTED NOT DETECTED Final   Serratia marcescens NOT DETECTED NOT DETECTED Final   Haemophilus influenzae NOT DETECTED NOT DETECTED Final   Neisseria  meningitidis NOT DETECTED NOT DETECTED Final   Pseudomonas aeruginosa NOT DETECTED NOT DETECTED Final   Stenotrophomonas maltophilia NOT DETECTED NOT DETECTED Final   Candida albicans NOT DETECTED NOT DETECTED Final   Candida auris NOT DETECTED NOT DETECTED Final   Candida glabrata NOT DETECTED NOT DETECTED Final   Candida krusei NOT DETECTED NOT DETECTED Final   Candida parapsilosis NOT DETECTED NOT DETECTED Final   Candida tropicalis NOT DETECTED NOT DETECTED Final   Cryptococcus neoformans/gattii NOT DETECTED NOT DETECTED Final   Meth resistant mecA/C and MREJ NOT DETECTED NOT DETECTED Final    Comment: Performed at [facility], [address] [place], [place] [address]  Culture, blood (x 2)     Status: Abnormal (Preliminary result)   Collection Time: 05/28/22  2:31 AM   Specimen: BLOOD RIGHT ARM  Result Value Ref Range Status   Specimen Description BLOOD RIGHT ARM  Final   Special Requests   Final    BOTTLES DRAWN AEROBIC AND ANAEROBIC Blood Culture results may not be optimal due to an excessive volume of blood received in culture bottles   Culture  Setup Time   Final    IN BOTH AEROBIC AND ANAEROBIC BOTTLES GRAM POSITIVE COCCI IN CHAINS CRITICAL VALUE NOTED.  VALUE IS CONSISTENT WITH PREVIOUSLY REPORTED AND CALLED VALUE. Performed at [facility], [address] [place], [place] [address]    Culture STAPHYLOCOCCUS AUREUS (A)  Final   Report Status PENDING  Incomplete      Radiology Studies: DG Chest Port 1 View  Result Date: 05/28/2022 CLINICAL DATA:  Sepsis EXAM: PORTABLE CHEST 1 VIEW COMPARISON:  Radiographs 03/24/2022 FINDINGS: Stable cardiomediastinal silhouette. No focal consolidation, pleural effusion, or pneumothorax. No acute osseous abnormality. IMPRESSION: No active disease. Electronically Signed   By: Minerva Festeryler  Stutzman M.D.   On: 05/28/2022 02:43      Scheduled Meds:  diltiazem  120 mg Oral Daily   enoxaparin (LOVENOX) injection  40 mg Subcutaneous  Q24H   folic acid  1 mg Oral Daily   metoprolol tartrate  25 mg Oral BID   multivitamin with minerals  1 tablet Oral Daily   thiamine  100 mg Oral Daily   Or   thiamine  100 mg Intravenous Daily   Continuous Infusions:   ceFAZolin (ANCEF) IV 2 g (05/29/22 [id number])  lacosamide (VIMPAT) IV 100 mg (05/29/22 0950)   levETIRAcetam 1,500 mg (05/29/22 6579)   metronidazole Stopped (05/29/22 0516)      Osvaldo Shipper, MD [facility] 05/29/2022, 10:00 AM

## 2022-05-29 NOTE — Evaluation (Signed)
Occupational Therapy Evaluation Patient Details Name: Eric Conley MRN: [id number] DOB: 06-03-1961 Today's Date: 05/29/2022   History of Present Illness Patient is a 61 y/o male who presents on 12/20 with seizure activity accompanied by UI. Also went into alcohol withdrawal and developed fever of unclear etiology. PMH includes depression, HTN, alcohol dependence, SDH in August 2023.   Clinical Impression   PTA, pt was independent in ADL and IADL. Upon eval, pt performing UB ADL with set-up and LB ADL with min guard A for safety. Pt performing oral care at sink with supervision-min guard A. Pt scoring a 4 on the short blessed test with difficulty with stating months of year in reverse order. Additionally with difficulty with simple money management during cognitive testing. Pt with decreased safety during mobility and awareness of deficits. Hopeful to progress mobility and cognition, however, recommending HHOT at this time to optimize safety with return to ADL and IADL.      Recommendations for follow up therapy are one component of a multi-disciplinary discharge planning process, led by the attending physician.  Recommendations may be updated based on patient status, additional functional criteria and insurance authorization.   Follow Up Recommendations  Home health OT (Pending progress, may not need follow up OT)     Assistance Recommended at Discharge Intermittent Supervision/Assistance  Patient can return home with the following A little help with walking and/or transfers;A little help with bathing/dressing/bathroom;Assistance with cooking/housework;Direct supervision/assist for medications management;Direct supervision/assist for financial management;Assist for transportation;Help with stairs or ramp for entrance    Functional Status Assessment  Patient has had a recent decline in their functional status and demonstrates the ability to make significant improvements in function in a  reasonable and predictable amount of time.  Equipment Recommendations  None recommended by OT    Recommendations for Other Services       Precautions / Restrictions Precautions Precautions: Fall Restrictions Weight Bearing Restrictions: No      Mobility Bed Mobility Overal bed mobility: Needs Assistance Bed Mobility: Supine to Sit, Sit to Supine     Supine to sit: Min guard, HOB elevated Sit to supine: Min guard   General bed mobility comments: No assist needed, increased time.    Transfers Overall transfer level: Needs assistance Equipment used: None Transfers: Sit to/from Stand Sit to Stand: Min guard           General transfer comment: Min guard A for safety      Balance Overall balance assessment: Needs assistance Sitting-balance support: Feet supported, No upper extremity supported Sitting balance-Leahy Scale: Fair     Standing balance support: During functional activity, Single extremity supported Standing balance-Leahy Scale: Fair Standing balance comment: min guard A- close supervision for distance within room                           ADL either performed or assessed with clinical judgement   ADL Overall ADL's : Needs assistance/impaired Eating/Feeding: Modified independent;Sitting   Grooming: Oral care;Supervision/safety;Standing Grooming Details (indicate cue type and reason): performing with supervision for safety Upper Body Bathing: Set up;Sitting   Lower Body Bathing: Min guard;Sit to/from stand   Upper Body Dressing : Set up;Sitting   Lower Body Dressing: Min guard;Sit to/from stand   Toilet Transfer: Min guard;Ambulation   Toileting- Clothing Manipulation and Hygiene: Supervision/safety;Sitting/lateral lean       Functional mobility during ADLs: Min guard General ADL Comments: Min guard A for safety  Vision Patient Visual Report: No change from baseline Vision Assessment?: No apparent visual deficits      Perception     Praxis      Pertinent Vitals/Pain       Hand Dominance Right   Extremity/Trunk Assessment Upper Extremity Assessment Upper Extremity Assessment: Generalized weakness   Lower Extremity Assessment Lower Extremity Assessment: Generalized weakness   Cervical / Trunk Assessment Cervical / Trunk Assessment: Normal   Communication Communication Communication: No difficulties   Cognition Arousal/Alertness: Awake/alert Behavior During Therapy: Flat affect Overall Cognitive Status: Impaired/Different from baseline Area of Impairment: Memory, Safety/judgement, Awareness, Problem solving                     Memory: Decreased short-term memory   Safety/Judgement: Decreased awareness of deficits, Decreased awareness of safety Awareness: Intellectual Problem Solving: Slow processing, Requires verbal cues General Comments: Pt scoring a 4 on the short blessed test of cognition. Pt with dificiulty problem solving and with organization to state months of the year in teh reverse order. Pt also with difficulty performing simple money management task. 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 Does not do IADLs. ADLs Comments: Independent         OT Problem List: Decreased strength;Decreased activity tolerance;Impaired balance (sitting and/or standing);Decreased cognition;Decreased safety awareness;Cardiopulmonary status limiting activity      OT Treatment/Interventions: Self-care/ADL training;Therapeutic exercise;DME and/or AE instruction;Cognitive remediation/compensation;Patient/family education;Balance training;Therapeutic activities    OT Goals(Current goals can be found in the care plan section) Acute Rehab OT Goals Patient Stated Goal: go home OT Goal Formulation: With patient Time For Goal Achievement: 06/12/22 Potential to Achieve Goals: Good  OT Frequency: Min 2X/week    Co-evaluation              AM-PAC OT "6 Clicks" Daily Activity     Outcome Measure Help from another person eating meals?: None Help from another person taking care of personal grooming?: A Little Help from another person toileting, which includes using toliet, bedpan, or urinal?: A Little Help from another person bathing (including washing, rinsing, drying)?: A Little Help from another person to put on and taking off regular upper body clothing?: A Little Help from another person to put on and taking off regular lower body clothing?: A Little 6 Click Score: 19   End of Session Equipment Utilized During Treatment: Gait belt Nurse Communication: Mobility status  Activity Tolerance: Patient tolerated treatment well Patient left: in bed;with call bell/phone within reach;with bed alarm set  OT Visit Diagnosis: Unsteadiness on feet (R26.81);Muscle weakness (generalized) (M62.81);Other symptoms and signs involving cognitive function                Time: 4010-2725 OT Time Calculation (  min): 20 min Charges:  OT General Charges $OT Visit: 1 Visit OT Evaluation $OT Eval Moderate Complexity: 1 Mod  Tyler Deis, OTR/L [facility] Acute Rehabilitation Office: [phone]    Myrla Halsted 05/29/2022, 4:51 PM

## 2022-05-29 NOTE — Plan of Care (Signed)
  Problem: Safety: Goal: Non-violent Restraint(s) Outcome: Progressing   Problem: Education: Goal: Knowledge of General Education information will improve Description: Including pain rating scale, medication(s)/side effects and non-pharmacologic comfort measures Outcome: Progressing   Problem: Health Behavior/Discharge Planning: Goal: Ability to manage health-related needs will improve Outcome: Progressing   Problem: Clinical Measurements: Goal: Ability to maintain clinical measurements within normal limits will improve Outcome: Progressing Goal: Will remain free from infection Outcome: Progressing Goal: Diagnostic test results will improve Outcome: Progressing Goal: Respiratory complications will improve Outcome: Progressing Goal: Cardiovascular complication will be avoided Outcome: Progressing   Problem: Activity: Goal: Risk for activity intolerance will decrease Outcome: Progressing   Problem: Nutrition: Goal: Adequate nutrition will be maintained Outcome: Progressing   Problem: Coping: Goal: Level of anxiety will decrease Outcome: Progressing   Problem: Elimination: Goal: Will not experience complications related to bowel motility Outcome: Progressing Goal: Will not experience complications related to urinary retention Outcome: Progressing   Problem: Pain Managment: Goal: General experience of comfort will improve Outcome: Progressing   Problem: Safety: Goal: Ability to remain free from injury will improve Outcome: Progressing   Problem: Skin Integrity: Goal: Risk for impaired skin integrity will decrease Outcome: Progressing   Problem: Fluid Volume: Goal: Hemodynamic stability will improve Outcome: Progressing   Problem: Clinical Measurements: Goal: Diagnostic test results will improve Outcome: Progressing Goal: Signs and symptoms of infection will decrease Outcome: Progressing   Problem: Respiratory: Goal: Ability to maintain adequate  ventilation will improve Outcome: Progressing   

## 2022-05-29 NOTE — Progress Notes (Signed)
ID PROGRESS NOTE  61yo M initially admitted for seizures but started to have fevers on HD#3, was started on broad spectrum abtx. Micro results +MSSA bacteremia. Abtx narrowed to cefazolin 2gm IV q8hr and now als

## 2022-05-29 NOTE — Evaluation (Signed)
Physical Therapy Evaluation Patient Details Name: Eric Conley MRN: [id number] DOB: January 26, 1961 Today's Date: 05/29/2022  History of Present Illness  Patient is a 61 y/o male who presents on 12/20 with seizure activity accompanied by UI. Also went into alcohol withdrawal and developed fever of unclear etiology. PMH includes depression, HTN, alcohol dependence, SDH in August 2023.  Clinical Impression  Patient presents with generalized weakness, impaired balance, tachycardia, cognitive deficits and impaired mobility s/p above.  Pt lives at home with his brother and sister in law and reports being independent for ADLs at baseline. Pt does not drive or perform IADLs. Today, pt requires Min-mod A for standing and gait training holding onto IV pole for support. Pt with poor awareness of safety/deficits at this time. HR ranging from 135-160 bpm with activity, pt asymptomatic. RN aware. Pt will need initial supervision/assist at home until strength./mobility improve and recommend use of RW for safety. Will follow acutely to maximize independence and mobility prior to return home.     Recommendations for follow up therapy are one component of a multi-disciplinary discharge planning process, led by the attending physician.  Recommendations may be updated based on patient status, additional functional criteria and insurance authorization.  Follow Up Recommendations Home health PT      Assistance Recommended at Discharge Intermittent Supervision/Assistance  Patient can return home with the following  A little help with walking and/or transfers;A little help with bathing/dressing/bathroom;Help with stairs or ramp for entrance;Assist for transportation;Assistance with cooking/housework    Equipment Recommendations None recommended by PT  Recommendations for Other Services       Functional Status Assessment Patient has had a recent decline in their functional status and demonstrates the ability to make  significant improvements in function in a reasonable and predictable amount of time.     Precautions / Restrictions Precautions Precautions: Fall Restrictions Weight Bearing Restrictions: No      Mobility  Bed Mobility Overal bed mobility: Needs Assistance Bed Mobility: Supine to Sit, Sit to Supine     Supine to sit: Min guard, HOB elevated Sit to supine: Min guard   General bed mobility comments: No assist needed, increased time.    Transfers Overall transfer level: Needs assistance Equipment used: None Transfers: Sit to/from Stand Sit to Stand: Mod assist           General transfer comment: Pt impulsively standing wth increased forward momentum neednig Mod A to regain balance.    Ambulation/Gait Ambulation/Gait assistance: Min assist Gait Distance (Feet): 120 Feet Assistive device: IV Pole Gait Pattern/deviations: Step-through pattern, Decreased stride length   Gait velocity interpretation: 1.31 - 2.62 ft/sec, indicative of limited community ambulator   General Gait Details: Slow, unsteady gait holding onto IV pole for support. Min A needed for balance, forward momentum present.  Stairs            Wheelchair Mobility    Modified Rankin (Stroke Patients Only)       Balance Overall balance assessment: Needs assistance Sitting-balance support: Feet supported, No upper extremity supported Sitting balance-Leahy Scale: Fair     Standing balance support: During functional activity, Single extremity supported Standing balance-Leahy Scale: Poor Standing balance comment: Requires UE support in standing and external support for walking                             Pertinent Vitals/Pain Pain Assessment Pain Assessment: No/denies pain    Home Living Family/patient expects to  be discharged to:: Private residence Living Arrangements: Other relatives Available Help at Discharge: Family;Available PRN/intermittently Type of Home: House Home  Access: Level entry       Home Layout: One level Home Equipment: Shower seat;Wheelchair - Forensic psychologist (2 wheels) Additional Comments: 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 Does not do IADLs. ADLs Comments: Independent     Hand Dominance   Dominant Hand: Right    Extremity/Trunk Assessment   Upper Extremity Assessment Upper Extremity Assessment: Defer to OT evaluation    Lower Extremity Assessment Lower Extremity Assessment: Generalized weakness    Cervical / Trunk Assessment Cervical / Trunk Assessment: Normal  Communication   Communication: No difficulties  Cognition Arousal/Alertness: Awake/alert Behavior During Therapy: Flat affect Overall Cognitive Status: Impaired/Different from baseline Area of Impairment: Orientation, Memory, Attention, Safety/judgement, Awareness, Problem solving                 Orientation Level: Disoriented to, Situation Current Attention Level: Sustained Memory: Decreased short-term memory   Safety/Judgement: Decreased awareness of deficits, Decreased awareness of safety Awareness: Intellectual Problem Solving: Slow processing, Requires verbal cues          General Comments General comments (skin integrity, edema, etc.): HR ranging from 135-160 bpm with activity, pt asymptomatic.    Exercises     Assessment/Plan    PT Assessment Patient needs continued PT services  PT Problem List Decreased strength;Decreased mobility;Decreased safety awareness;Decreased balance;Decreased activity tolerance;Decreased cognition       PT Treatment Interventions Therapeutic exercise;Gait training;Patient/family education;Therapeutic activities;Functional mobility training;Balance training    PT Goals (Current goals can be found in  the Care Plan section)  Acute Rehab PT Goals Patient Stated Goal: to go home PT Goal Formulation: With patient Time For Goal Achievement: 06/12/22 Potential to Achieve Goals: Fair    Frequency Min 3X/week     Co-evaluation               AM-PAC PT "6 Clicks" Mobility  Outcome Measure Help needed turning from your back to your side while in a flat bed without using bedrails?: A Little Help needed moving from lying on your back to sitting on the side of a flat bed without using bedrails?: A Little Help needed moving to and from a bed to a chair (including a wheelchair)?: A Little Help needed standing up from a chair using your arms (e.g., wheelchair or bedside chair)?: A Lot Help needed to walk in hospital room?: A Little Help needed climbing 3-5 steps with a railing? : A Little 6 Click Score: 17    End of Session Equipment Utilized During Treatment: Gait belt Activity Tolerance: Treatment limited secondary to medical complications (Comment) (tachycardia) Patient left: in bed;with call bell/phone within reach;with bed alarm set Nurse Communication: Mobility status PT Visit Diagnosis: Muscle weakness (generalized) (M62.81);Difficulty in walking, not elsewhere classified (R26.2);Unsteadiness on feet (R26.81)    Time: 2683-4196 PT Time Calculation (min) (ACUTE ONLY): 20 min   Charges:   PT Evaluation $PT Eval Moderate Complexity: 1 Mod          Vale Haven, PT, DPT Acute Rehabilitation Services Secure chat preferred Office [phone]     Blake Divine A Caelan Branden 05/29/2022, 9:26 AM

## 2022-05-30 ENCOUNTER — Inpatient Hospital Stay (HOSPITAL_COMMUNITY): Payer: 59

## 2022-05-30 DIAGNOSIS — R7881 Bacteremia: Secondary | ICD-10-CM

## 2022-05-30 DIAGNOSIS — R569 Unspecified convulsions: Secondary | ICD-10-CM | POA: Diagnosis not present

## 2022-05-30 DIAGNOSIS — G9341 Metabolic encephalopathy: Secondary | ICD-10-CM | POA: Diagnosis not present

## 2022-05-30 LAB — CBC WITH DIFFERENTIAL/PLATELET
Abs Immature Granulocytes: 0 10*3/uL (ref 0.00–0.07)
Basophils Absolute: 0 10*3/uL (ref 0.0–0.1)
Basophils Relative: 0 %
Eosinophils Absolute: 0 10*3/uL (ref 0.0–0.5)
Eosinophils Relative: 0 %
HCT: 35.9 % — ABNORMAL LOW (ref 39.0–52.0)
Hemoglobin: 12.2 g/dL — ABNORMAL LOW (ref 13.0–17.0)
Lymphocytes Relative: 41 %
Lymphs Abs: 0.7 10*3/uL (ref 0.7–4.0)
MCH: 30 pg (ref 26.0–34.0)
MCHC: 34 g/dL (ref 30.0–36.0)
MCV: 88.4 fL (ref 80.0–100.0)
Monocytes Absolute: 0.2 10*3/uL (ref 0.1–1.0)
Monocytes Relative: 11 %
Neutro Abs: 0.8 10*3/uL — ABNORMAL LOW (ref 1.7–7.7)
Neutrophils Relative %: 48 %
Platelets: 176 10*3/uL (ref 150–400)
RBC: 4.06 MIL/uL — ABNORMAL LOW (ref 4.22–5.81)
RDW: 15.7 % — ABNORMAL HIGH (ref 11.5–15.5)
WBC: 1.7 10*3/uL — ABNORMAL LOW (ref 4.0–10.5)
nRBC: 0 % (ref 0.0–0.2)
nRBC: 0 /100 WBC

## 2022-05-30 LAB — COMPREHENSIVE METABOLIC PANEL
ALT: 56 U/L — ABNORMAL HIGH (ref 0–44)
AST: 64 U/L — ABNORMAL HIGH (ref 15–41)
Albumin: 3.1 g/dL — ABNORMAL LOW (ref 3.5–5.0)
Alkaline Phosphatase: 67 U/L (ref 38–126)
Anion gap: 8 (ref 5–15)
BUN: 9 mg/dL (ref 6–20)
CO2: 25 mmol/L (ref 22–32)
Calcium: 8.8 mg/dL — ABNORMAL LOW (ref 8.9–10.3)
Chloride: 97 mmol/L — ABNORMAL LOW (ref 98–111)
Creatinine, Ser: 0.77 mg/dL (ref 0.61–1.24)
GFR, Estimated: >60 mL/min (ref >60)
Glucose, Bld: 123 mg/dL — ABNORMAL HIGH (ref 70–99)
Potassium: 3.6 mmol/L (ref 3.5–5.1)
Sodium: 130 mmol/L — ABNORMAL LOW (ref 135–145)
Total Bilirubin: 0.2 mg/dL — ABNORMAL LOW (ref 0.3–1.2)
Total Protein: 6.4 g/dL — ABNORMAL LOW (ref 6.5–8.1)

## 2022-05-30 LAB — CULTURE, BLOOD (ROUTINE X 2)

## 2022-05-30 LAB — ECHOCARDIOGRAM LIMITED
Area-P 1/2: 3.99 cm2
Height: 67 in
Weight: 2349.22 oz

## 2022-05-30 LAB — HEPATITIS PANEL, ACUTE
HCV Ab: NONREACTIVE
Hep A IgM: NONREACTIVE
Hep B C IgM: NONREACTIVE
Hepatitis B Surface Ag: NONREACTIVE

## 2022-05-30 LAB — PROCALCITONIN: Procalcitonin: 0.41 ng/mL

## 2022-05-30 MED ORDER — POTASSIUM CHLORIDE CRYS ER 20 MEQ PO TBCR
40.0000 meq | EXTENDED_RELEASE_TABLET | Freq: Once | ORAL | Status: AC
  Administered 2022-05-30: 40 meq via ORAL
  Filled 2022-05-30: qty 2

## 2022-05-30 MED ORDER — LACOSAMIDE 50 MG PO TABS
100.0000 mg | ORAL_TABLET | Freq: Two times a day (BID) | ORAL | Status: DC
  Administered 2022-05-30 – 2022-06-02 (×6): 100 mg via ORAL
  Filled 2022-05-30 (×6): qty 2

## 2022-05-30 MED ORDER — LEVETIRACETAM 750 MG PO TABS
1500.0000 mg | ORAL_TABLET | Freq: Two times a day (BID) | ORAL | Status: DC
  Administered 2022-05-30 – 2022-06-02 (×6): 1500 mg via ORAL
  Filled 2022-05-30 (×6): qty 2

## 2022-05-30 NOTE — Progress Notes (Signed)
Dr. Elinor Parkinson with questions regarding patient's ?wound on abdomen. Clarified with patient that right lower abdomen healed over wound is where his pants rub up against his abdomen. No open areas noted. Will complete wound documentation on LDA and flowsheet

## 2022-05-30 NOTE — Progress Notes (Signed)
PROGRESS NOTE    Eric Conley  SWF:[id number] DOB: 1960-09-04 DOA: 05/25/2022 PCP: Assunta Found, MD   Brief Narrative:  61 year old male with history of alcohol abuse in the past, seizure disorder, hypertension, subdural hematoma in August 2023 presented with seizures.  Neurology was consulted.  He was started on antiseizure medications and LTM EEG was ordered.  Patient subsequently went into alcohol withdrawal.  Subsequent the patient developed fever.  Blood cultures growing Staph aureus.  Assessment & Plan:   Breakthrough seizures/seizure disorder Patient was seen by neurology.  Noted to be on Keppra and Vimpat.  LTM has been discontinued.  No further obvious seizure activity noted.    Staph aureus bacteremia Developed fever on 12/22.  Had elevated lactic acid level.  Procalcitonin less than 0.1.  Patient started on broad-spectrum antibiotics.   Procalcitonin has increased to 0.41.  Noted to be afebrile. Blood cultures growing Staph aureus.  Antibiotics were changed over to cefazolin. ID to see. UA was unremarkable.  Chest x-ray does not show any acute findings.  Continue to monitor closely. Source of bacteremia is not clear.  No obvious skin rashes noted.  Patient denies any joint pains.  No recent procedures. Will likely need TTE versus TEE.  Will wait for ID input first.  Leukopenia Likely due to acute infection.  Lacosamide can also cause neutropenia.  If there is no rebound in the next 24 to 48 hours will need to discuss with neurology to consider changing antiepileptics to alternative.  Acute encephalopathy Likely multifactorial including seizures as well as alcohol withdrawal.   Appears to be gradually improving.  Alcohol withdrawal/history of alcohol abuse  -Alcohol level less than 10 on presentation.  Family reported no recent drinking. -Currently on CIWA protocol.  Continue thiamine, multivitamin and folic acid. No evidence for withdrawal currently.  Mild  transaminitis Mildly elevated AST ALT noted.  Stable today.  Hepatitis panel is pending.  Hypokalemia/hypomagnesemia/hyponatremia Supplemented.  Monitor periodically. Monitor sodium levels closely  Normocytic anemia Hemoglobin is stable.  No evidence of overt bleeding.  Acute metabolic acidosis Resolved  Essential hypertension/sinus tachycardia Sinus tachycardia could be rebound tachycardia since patient was on metoprolol prior to admission and this was placed on hold.  Metoprolol was resumed yesterday.  Improvement in heart rate noted.   Blood pressure noted to be borderline low.  Will discontinue diltiazem.  DVT prophylaxis: Lovenox Code Status: Full Family Communication: None at bedside Disposition Plan: Home health recommended by physical therapy  Status is: Inpatient Remains inpatient appropriate because: Encephalopathy, seizures  Consultants: Neurology  Procedures: LTM EEG  Antimicrobials: None   Subjective: Patient denies any complaints.  Noted to be irritable today.  Objective: Vitals:   05/29/22 2345 05/30/22 0400 05/30/22 0500 05/30/22 0747  BP: 112/76 121/64  110/73  Pulse:  86  97  Resp:  18  16  Temp:  98.7 F (37.1 C)  98.4 F (36.9 C)  TempSrc:  Oral  Oral  SpO2:  96%  93%  Weight:   66.6 kg   Height:        Intake/Output Summary (Last 24 hours) at 05/30/2022 1003 Last data filed at 05/30/2022 1001 Gross per 24 hour  Intake 370.06 ml  Output 400 ml  Net -29.94 ml    Filed Weights   05/27/22 0500 05/28/22 0500 05/30/22 0500  Weight: 66.8 kg 67.7 kg 66.6 kg    Examination:  General appearance: Awake alert.  In no distress Resp: Clear to auscultation bilaterally.  Normal effort  Cardio: S1-S2 is normal regular.  No S3-S4.  No rubs murmurs or bruit GI: Abdomen is soft.  Nontender nondistended.  Bowel sounds are present normal.  No masses organomegaly Extremities: No edema.  Full range of motion of lower extremities.     Data  Reviewed: I have personally reviewed following labs and imaging studies  CBC: Recent Labs  Lab 05/25/22 2104 05/26/22 0407 05/28/22 0231 05/29/22 0255 05/30/22 0310  WBC 5.9 7.1 9.4 3.1* 1.7*  NEUTROABS 3.4  --  7.9*  --  0.8*  HGB 13.2 11.5* 12.6* 13.1 12.2*  HCT 39.9 34.0* 37.6* 38.1* 35.9*  MCV 93.4 91.2 91.0 88.8 88.4  PLT 256 224 228 192 176    Basic Metabolic Panel: Recent Labs  Lab 05/25/22 2104 05/26/22 0407 05/27/22 0340 05/28/22 0231 05/29/22 0255 05/30/22 0310  NA 141 139 143 135 131* 130*  K 3.8 3.5 3.2* 4.3 4.1 3.6  CL 103 105 107 103 100 97*  CO2 15* 24 25 23 22 25   GLUCOSE 119* 97 100* 142* 118* 123*  BUN 8 7 7 8  5* 9  CREATININE 1.30* 0.89 0.91 0.86 0.94 0.77  CALCIUM 9.5 8.4* 9.5 9.7 9.2 8.8*  MG 2.2  --  2.0 1.5* 1.8  --     GFR: Estimated Creatinine Clearance: 93 mL/min (by C-G formula based on SCr of 0.77 mg/dL).  Liver Function Tests: Recent Labs  Lab 05/25/22 2104 05/27/22 0340 05/28/22 0231 05/29/22 0255 05/30/22 0310  AST 32 17 17 64* 64*  ALT 20 17 18  54* 56*  ALKPHOS 71 74 75 72 67  BILITOT 0.5 0.6 0.6 0.6 0.2*  PROT 7.4 6.4* 6.9 6.7 6.4*  ALBUMIN 4.0 3.5 3.8 3.5 3.1*    Coagulation Profile: Recent Labs  Lab 05/25/22 2104 05/28/22 0231  INR 1.0 1.0     CBG: Recent Labs  Lab 05/25/22 2103  GLUCAP 126*     Sepsis Labs: Recent Labs  Lab 05/28/22 0231 05/28/22 0346 05/28/22 0749 05/29/22 0255 05/30/22 0310  PROCALCITON <0.10  --   --  0.37 0.41  LATICACIDVEN 1.0 2.9* 2.7*  --   --      Recent Results (from the past 240 hour(s))  Culture, blood (x 2)     Status: Abnormal   Collection Time: 05/28/22  2:29 AM   Specimen: BLOOD  Result Value Ref Range Status   Specimen Description BLOOD SITE NOT SPECIFIED  Final   Special Requests   Final    BOTTLES DRAWN AEROBIC AND ANAEROBIC Blood Culture results may not be optimal due to an excessive volume of blood received in culture bottles   Culture  Setup Time    Final    IN BOTH AEROBIC AND ANAEROBIC BOTTLES GRAM POSITIVE COCCI IN CHAINS IN CLUSTERS Organism ID to follow CRITICAL RESULT CALLED TO, READ BACK BY AND VERIFIED WITH:  C/ PHARMD K. HURTH 05/28/22 1646 A. LAFRANCE Performed at [facility], [address], [place], [facility] [address]    Culture STAPHYLOCOCCUS AUREUS (A)  Final   Report Status 05/30/2022 FINAL  Final   Organism ID, Bacteria STAPHYLOCOCCUS AUREUS  Final      Susceptibility   Staphylococcus aureus - MIC*    CIPROFLOXACIN <=0.5 SENSITIVE Sensitive     ERYTHROMYCIN <=0.25 SENSITIVE Sensitive     GENTAMICIN <=0.5 SENSITIVE Sensitive     OXACILLIN 0.5 SENSITIVE Sensitive     TETRACYCLINE <=1 SENSITIVE Sensitive     VANCOMYCIN 1 SENSITIVE Sensitive  TRIMETH/SULFA <=10 SENSITIVE Sensitive     CLINDAMYCIN <=0.25 SENSITIVE Sensitive     RIFAMPIN <=0.5 SENSITIVE Sensitive     Inducible Clindamycin NEGATIVE Sensitive     * STAPHYLOCOCCUS AUREUS  Blood Culture ID Panel (Reflexed)     Status: Abnormal   Collection Time: 05/28/22  2:29 AM  Result Value Ref Range Status   Enterococcus faecalis NOT DETECTED NOT DETECTED Final   Enterococcus Faecium NOT DETECTED NOT DETECTED Final   Listeria monocytogenes NOT DETECTED NOT DETECTED Final   Staphylococcus species DETECTED (A) NOT DETECTED Final    Comment: CRITICAL RESULT CALLED TO, READ BACK BY AND VERIFIED WITH:  C/ PHARMD K. HURTH 05/28/22 1646 A. LAFRANCE    Staphylococcus aureus (BCID) DETECTED (A) NOT DETECTED Final    Comment: CRITICAL RESULT CALLED TO, READ BACK BY AND VERIFIED WITH:  C/ PHARMD K. HURTH 05/28/22 1646 A. LAFRANCE    Staphylococcus epidermidis NOT DETECTED NOT DETECTED Final   Staphylococcus lugdunensis NOT DETECTED NOT DETECTED Final   Streptococcus species NOT DETECTED NOT DETECTED Final   Streptococcus agalactiae NOT DETECTED NOT DETECTED Final   Streptococcus pneumoniae NOT DETECTED NOT DETECTED Final   Streptococcus pyogenes NOT DETECTED  NOT DETECTED Final   A.calcoaceticus-baumannii NOT DETECTED NOT DETECTED Final   Bacteroides fragilis NOT DETECTED NOT DETECTED Final   Enterobacterales NOT DETECTED NOT DETECTED Final   Enterobacter cloacae complex NOT DETECTED NOT DETECTED Final   Escherichia coli NOT DETECTED NOT DETECTED Final   Klebsiella aerogenes NOT DETECTED NOT DETECTED Final   Klebsiella oxytoca NOT DETECTED NOT DETECTED Final   Klebsiella pneumoniae NOT DETECTED NOT DETECTED Final   Proteus species NOT DETECTED NOT DETECTED Final   Salmonella species NOT DETECTED NOT DETECTED Final   Serratia marcescens NOT DETECTED NOT DETECTED Final   Haemophilus influenzae NOT DETECTED NOT DETECTED Final   Neisseria meningitidis NOT DETECTED NOT DETECTED Final   Pseudomonas aeruginosa NOT DETECTED NOT DETECTED Final   Stenotrophomonas maltophilia NOT DETECTED NOT DETECTED Final   Candida albicans NOT DETECTED NOT DETECTED Final   Candida auris NOT DETECTED NOT DETECTED Final   Candida glabrata NOT DETECTED NOT DETECTED Final   Candida krusei NOT DETECTED NOT DETECTED Final   Candida parapsilosis NOT DETECTED NOT DETECTED Final   Candida tropicalis NOT DETECTED NOT DETECTED Final   Cryptococcus neoformans/gattii NOT DETECTED NOT DETECTED Final   Meth resistant mecA/C and MREJ NOT DETECTED NOT DETECTED Final    Comment: Performed at [facility], [address] [place], [place] [address]  Culture, blood (x 2)     Status: Abnormal   Collection Time: 05/28/22  2:31 AM   Specimen: BLOOD RIGHT ARM  Result Value Ref Range Status   Specimen Description BLOOD RIGHT ARM  Final   Special Requests   Final    BOTTLES DRAWN AEROBIC AND ANAEROBIC Blood Culture results may not be optimal due to an excessive volume of blood received in culture bottles   Culture  Setup Time   Final    IN BOTH AEROBIC AND ANAEROBIC BOTTLES GRAM POSITIVE COCCI IN CHAINS CRITICAL VALUE NOTED.  VALUE IS CONSISTENT WITH PREVIOUSLY REPORTED AND CALLED  VALUE.    Culture (A)  Final    STAPHYLOCOCCUS AUREUS SUSCEPTIBILITIES PERFORMED ON PREVIOUS CULTURE WITHIN THE LAST 5 DAYS. Performed at [facility], [address] [place], [place] [address]    Report Status 05/30/2022 FINAL  Final      Radiology Studies: No results found.  Scheduled Meds:  diltiazem  120 mg Oral Daily   enoxaparin (LOVENOX) injection  40 mg Subcutaneous Q24H   folic acid  1 mg Oral Daily   metoprolol tartrate  25 mg Oral BID   multivitamin with minerals  1 tablet Oral Daily   thiamine  100 mg Oral Daily   Or   thiamine  100 mg Intravenous Daily   Continuous Infusions:   ceFAZolin (ANCEF) IV Stopped (05/30/22 0232)   lacosamide (VIMPAT) IV 100 mg (05/30/22 0942)   levETIRAcetam 1,500 mg (05/30/22 0557)      Osvaldo Shipper, MD [facility] 05/30/2022, 10:03 AM

## 2022-05-30 NOTE — Consult Note (Addendum)
[facility]                                                                                        Patient Identification: Patient Name: Eric Conley MRN: [id number] [place] Date: 05/25/2022  8:52 PM Today's Date: 05/30/2022 Reason for consult: Staph bacteremia Requesting provider: Tivis Ringer auto consult  Principal Problem:   Seizures ([place]) Active Problems:   Essential hypertension   Seizure ([place])   Alcohol abuse  Antibiotics:  Cefepime 12/22-12/23 , cefazolin 12/23- Metronidazole 12/22-12/23  Lines/Hardware: No known   Assessment 61 year old male with PMH as below including alcohol abuse, traumatic subdural hematoma August 2023 and seizures on Keppra, HTN who presented to the ED on 12/20 with seizure.  Hospital course complicated by fevers on 12/22 followed by MSSA bacteremia on 12/23. No definite signs of phlebitis on exam.   # MSSA bacteremia, nosocomial onset  - No known hardware - No spinal tenderness or metastatic sites of infection on exam   # Leukopenia  - ANC 800  - monitor   # Transaminitis  - acute hepatitis panel negative  - in the setting of sepsis, also with h/o alcohol abuse   Recommendations  Continue cefazolin as is  Ordered 2 sets of peripheral blood cx for clearance  Monitor for metastatic sites of infection  TTE ordered  Monitor CBCw diff and BMP  Following   Rest of the management as per the primary team. Please call with questions or concerns.  Thank you for the consult  Rosiland Oz, MD Infectious Disease Physician [facility] [address] [place], [place] [address] Phone: [phone]  Fax: [phone]  __________________________________________________________________________________________________________ HPI and Hospital Course: 61 year old male with PMH as below including alcohol  abuse, traumatic subdural hematoma August 2023 and seizures on Keppra, HTN who presented to the ED on 12/20 with seizure.  Family called 911 after seizure-like activity.  At EMS arrival patient was alert and oriented to self and was able to ambulate into the ambulance.  He then had a 4-minute generalized tonic-clonic seizure with urinary incontinence, status post 5 mg IM Versed to abort the seizure.  Also placed on nonrebreather as well as nasal airway for airway protection.  Collar was placed for possible unwitnessed fall.  At ED, several suspected seizures though did not have any witnessed convulsive activity.  Neurology consulted, recommended additional dose of Keppra as well as Vimpat.  CT head and spine did not show any acute abnormality. EEG 12/22 with moderate to severe diffuse encephalopathy with no seizures or epileptic activity.   Hospital course complicated by alcohol withdrawal and fevers ( since 12/22) Blood culture 12/23 2/2 sets MSSA  ROS: General- Denies fever, chills, loss of appetite and loss of weight HEENT - Denies headache, blurry vision, neck pain, sinus pain Chest - Denies any chest pain, SOB or cough CVS- Denies any dizziness/lightheadedness, syncopal attacks, palpitations Abdomen- Denies any nausea, vomiting, abdominal pain, hematochezia and diarrhea Neuro - Denies any weakness, numbness, tingling sensation Psych - Denies any changes in mood irritability or depressive symptoms GU- Denies any burning, dysuria,  hematuria or increased frequency of urination Skin - denies any rashes/lesions MSK - denies any joint pain/swelling or restricted ROM   Past Medical History:  Diagnosis Date   Acid reflux    Depression    Hypertension    Past Surgical History:  Procedure Laterality Date   NO PAST SURGERIES       Scheduled Meds:  diltiazem  120 mg Oral Daily   enoxaparin (LOVENOX) injection  40 mg Subcutaneous [id number]   folic acid  1 mg Oral Daily   metoprolol tartrate  25  mg Oral BID   multivitamin with minerals  1 tablet Oral Daily   thiamine  100 mg Oral Daily   Or   thiamine  100 mg Intravenous Daily   Continuous Infusions:   ceFAZolin (ANCEF) IV 2 g (05/30/22 0159)   lacosamide (VIMPAT) IV 100 mg (05/30/22 0942)   levETIRAcetam 1,500 mg (05/30/22 0557)   PRN Meds:.acetaminophen **OR** acetaminophen, diphenhydrAMINE, ondansetron **OR** ondansetron (ZOFRAN) IV  Allergies  Allergen Reactions   Codeine    Poison Sumac Extract    Vancomycin     Possible redman rxn - order at 1/2 infusion rate    Social History   Socioeconomic History   Marital status: Legally Separated    Spouse name: Not on file   Number of children: Not on file   Years of education: Not on file   Highest education level: Not on file  Occupational History   Not on file  Tobacco Use   Smoking status: Every Day    Packs/day: 0.25    Types: Cigarettes   Smokeless tobacco: Never  Vaping Use   Vaping Use: Never used  Substance and Sexual Activity   Alcohol use: Not Currently    Alcohol/week: 7.0 standard drinks of alcohol    Types: 7 Cans of beer per week    Comment: 1 beer daily, last drink evening of 12/28/21   Drug use: Not Currently   Sexual activity: Not on file  Other Topics Concern   Not on file  Social History Narrative   Right handed   Caffeine use: caffeine pill as needed, energy drinks (1 per day)   Social Determinants of Health   Financial Resource Strain: Not on file  Food Insecurity: No Food Insecurity (03/20/2022)   Hunger Vital Sign    Worried About Running Out of Food in the Last Year: Never true    Ran Out of Food in the Last Year: Never true  Transportation Needs: No Transportation Needs (03/20/2022)   PRAPARE - Hydrologist (Medical): No    Lack of Transportation (Non-Medical): No  Physical Activity: Not on file  Stress: Not on file  Social Connections: Not on file  Intimate Partner Violence: Not At Risk  (03/20/2022)   Humiliation, Afraid, Rape, and Kick questionnaire    Fear of Current or Ex-Partner: No    Emotionally Abused: No    Physically Abused: No    Sexually Abused: No   No family history on file.  Vitals BP 110/73 (BP Location: Left Arm)   Pulse 97   Temp 98.4 F (36.9 C) (Oral)   Resp 16   Ht 5\' 7"  (1.702 m)   Wt 66.6 kg   SpO2 93%   BMI 23.00 kg/m    Physical Exam Constitutional:  Caucasian male lying in the bed and appears comfortable     Comments:   Cardiovascular:     Rate and Rhythm: Normal  rate and regular rhythm.     Heart sounds: s1s2.   Pulmonary:     Effort: Pulmonary effort is normal on room air     Comments: Normal breath sounds   Abdominal:     Palpations: Abdomen is soft.     Tenderness: non tender and non distended. BS+, healed scar in RLQ  Musculoskeletal:        General: No swelling or tenderness in peripheral joints, No spinal tenderness. No phlebitis on exam   Skin:    Comments: No obvious rashes  Neurological:     General: Grossly non focal, awake, alert and oriented   Psychiatric:        Mood and Affect: Mood normal.    Pertinent Microbiology Results for orders placed or performed during the hospital encounter of 05/25/22  Culture, blood (x 2)     Status: Abnormal   Collection Time: 05/28/22  2:29 AM   Specimen: BLOOD  Result Value Ref Range Status   Specimen Description BLOOD SITE NOT SPECIFIED  Final   Special Requests   Final    BOTTLES DRAWN AEROBIC AND ANAEROBIC Blood Culture results may not be optimal due to an excessive volume of blood received in culture bottles   Culture  Setup Time   Final    IN BOTH AEROBIC AND ANAEROBIC BOTTLES GRAM POSITIVE COCCI IN CHAINS IN CLUSTERS Organism ID to follow CRITICAL RESULT CALLED TO, READ BACK BY AND VERIFIED WITH:  C/ PHARMD K. HURTH 05/28/22 1646 A. LAFRANCE Performed at [facility], [place] [address], [place], [place] [address]    Culture STAPHYLOCOCCUS AUREUS (A)   Final   Report Status 05/30/2022 FINAL  Final   Organism ID, Bacteria STAPHYLOCOCCUS AUREUS  Final      Susceptibility   Staphylococcus aureus - MIC*    CIPROFLOXACIN <=0.5 SENSITIVE Sensitive     ERYTHROMYCIN <=0.25 SENSITIVE Sensitive     GENTAMICIN <=0.5 SENSITIVE Sensitive     OXACILLIN 0.5 SENSITIVE Sensitive     TETRACYCLINE <=1 SENSITIVE Sensitive     VANCOMYCIN 1 SENSITIVE Sensitive     TRIMETH/SULFA <=10 SENSITIVE Sensitive     CLINDAMYCIN <=0.25 SENSITIVE Sensitive     RIFAMPIN <=0.5 SENSITIVE Sensitive     Inducible Clindamycin NEGATIVE Sensitive     * STAPHYLOCOCCUS AUREUS  Blood Culture ID Panel (Reflexed)     Status: Abnormal   Collection Time: 05/28/22  2:29 AM  Result Value Ref Range Status   Enterococcus faecalis NOT DETECTED NOT DETECTED Final   Enterococcus Faecium NOT DETECTED NOT DETECTED Final   Listeria monocytogenes NOT DETECTED NOT DETECTED Final   Staphylococcus species DETECTED (A) NOT DETECTED Final    Comment: CRITICAL RESULT CALLED TO, READ BACK BY AND VERIFIED WITH:  C/ PHARMD K. HURTH 05/28/22 1646 A. LAFRANCE    Staphylococcus aureus (BCID) DETECTED (A) NOT DETECTED Final    Comment: CRITICAL RESULT CALLED TO, READ BACK BY AND VERIFIED WITH:  C/ PHARMD K. HURTH 05/28/22 1646 A. LAFRANCE    Staphylococcus epidermidis NOT DETECTED NOT DETECTED Final   Staphylococcus lugdunensis NOT DETECTED NOT DETECTED Final   Streptococcus species NOT DETECTED NOT DETECTED Final   Streptococcus agalactiae NOT DETECTED NOT DETECTED Final   Streptococcus pneumoniae NOT DETECTED NOT DETECTED Final   Streptococcus pyogenes NOT DETECTED NOT DETECTED Final   A.calcoaceticus-baumannii NOT DETECTED NOT DETECTED Final   Bacteroides fragilis NOT DETECTED NOT DETECTED Final   Enterobacterales NOT DETECTED NOT DETECTED Final   Enterobacter  cloacae complex NOT DETECTED NOT DETECTED Final   Escherichia coli NOT DETECTED NOT DETECTED Final   Klebsiella aerogenes NOT DETECTED  NOT DETECTED Final   Klebsiella oxytoca NOT DETECTED NOT DETECTED Final   Klebsiella pneumoniae NOT DETECTED NOT DETECTED Final   Proteus species NOT DETECTED NOT DETECTED Final   Salmonella species NOT DETECTED NOT DETECTED Final   Serratia marcescens NOT DETECTED NOT DETECTED Final   Haemophilus influenzae NOT DETECTED NOT DETECTED Final   Neisseria meningitidis NOT DETECTED NOT DETECTED Final   Pseudomonas aeruginosa NOT DETECTED NOT DETECTED Final   Stenotrophomonas maltophilia NOT DETECTED NOT DETECTED Final   Candida albicans NOT DETECTED NOT DETECTED Final   Candida auris NOT DETECTED NOT DETECTED Final   Candida glabrata NOT DETECTED NOT DETECTED Final   Candida krusei NOT DETECTED NOT DETECTED Final   Candida parapsilosis NOT DETECTED NOT DETECTED Final   Candida tropicalis NOT DETECTED NOT DETECTED Final   Cryptococcus neoformans/gattii NOT DETECTED NOT DETECTED Final   Meth resistant mecA/C and MREJ NOT DETECTED NOT DETECTED Final    Comment: Performed at [facility], [place] [address], [place], [place] [address]  Culture, blood (x 2)     Status: Abnormal   Collection Time: 05/28/22  2:31 AM   Specimen: BLOOD RIGHT ARM  Result Value Ref Range Status   Specimen Description BLOOD RIGHT ARM  Final   Special Requests   Final    BOTTLES DRAWN AEROBIC AND ANAEROBIC Blood Culture results may not be optimal due to an excessive volume of blood received in culture bottles   Culture  Setup Time   Final    IN BOTH AEROBIC AND ANAEROBIC BOTTLES GRAM POSITIVE COCCI IN CHAINS CRITICAL VALUE NOTED.  VALUE IS CONSISTENT WITH PREVIOUSLY REPORTED AND CALLED VALUE.    Culture (A)  Final    STAPHYLOCOCCUS AUREUS SUSCEPTIBILITIES PERFORMED ON PREVIOUS CULTURE WITHIN THE LAST 5 DAYS. Performed at [facility], [place] [address], [place], [place] [address]    Report Status 05/30/2022 FINAL  Final    Pertinent Lab seen by me:    Latest Ref Rng & Units 05/30/2022    3:10 AM  05/29/2022    2:55 AM 05/28/2022    2:31 AM  CBC  WBC 4.0 - 10.5 K/uL 1.7  3.1  9.4   Hemoglobin 13.0 - 17.0 g/dL 12.2  13.1  12.6   Hematocrit 39.0 - 52.0 % 35.9  38.1  37.6   Platelets 150 - 400 K/uL 176  192  228       Latest Ref Rng & Units 05/30/2022    3:10 AM 05/29/2022    2:55 AM 05/28/2022    2:31 AM  CMP  Glucose 70 - 99 mg/dL 123  118  142   BUN 6 - 20 mg/dL 9  5  8    Creatinine 0.61 - 1.24 mg/dL 0.77  0.94  0.86   Sodium 135 - 145 mmol/L 130  131  135   Potassium 3.5 - 5.1 mmol/L 3.6  4.1  4.3   Chloride 98 - 111 mmol/L 97  100  103   CO2 22 - 32 mmol/L 25  22  23    Calcium 8.9 - 10.3 mg/dL 8.8  9.2  9.7   Total Protein 6.5 - 8.1 g/dL 6.4  6.7  6.9   Total Bilirubin 0.3 - 1.2 mg/dL 0.2  0.6  0.6   Alkaline Phos 38 - 126 U/L 67  72  75   AST 15 - 41 U/L 64  64  17   ALT 0 - 44 U/L 56  54  18     Pertinent Imagings/Other Imagings Plain films and CT images have been personally visualized and interpreted; radiology reports have been reviewed. Decision making incorporated into the Impression / Recommendations.  DG Chest Port 1 View  Result Date: 05/28/2022 CLINICAL DATA:  Sepsis EXAM: PORTABLE CHEST 1 VIEW COMPARISON:  Radiographs 03/24/2022 FINDINGS: Stable cardiomediastinal silhouette. No focal consolidation, pleural effusion, or pneumothorax. No acute osseous abnormality. IMPRESSION: No active disease. Electronically Signed   By: Minerva Fester M.D.   On: 05/28/2022 02:43   Overnight EEG with video  Result Date: 05/26/2022 Charlsie Quest, MD     05/27/2022 12:00 PM Patient Name: CLARENCE COGSWELL MRN: [id number] Epilepsy Attending: Charlsie Quest Referring Physician/Provider: Rejeana Brock, MD Duration: 05/26/2022 0124 to 05/27/2022 0124 Patient history: 61 year old male with a history of seizures who presents with seizure flurry of unclear etiology.  EEG to evaluate for seizure. Level of alertness: Awake, asleep AEDs during EEG study: LEV, LCM, VPA  Technical aspects: This EEG study was done with scalp electrodes positioned according to the 10-20 International system of electrode placement. Electrical activity was reviewed with band pass filter of 1-70Hz , sensitivity of 7 uV/mm, display speed of 85mm/sec with a 60Hz  notched filter applied as appropriate. EEG data were recorded continuously and digitally stored.  Video monitoring was available and reviewed as appropriate. Description: The posterior dominant rhythm consists of 9-10 Hz activity of moderate voltage (25-35 uV) seen predominantly in posterior head regions, symmetric and reactive to eye opening and eye closing. Sleep was characterized by vertex waves, sleep spindles (12 to 14 Hz), maximal frontocentral region. EEG also showed continuous generalized 3-6hz  theta-delta slowing with overriding 15-18hz  beta activity. Six seizures without clinical signs were noted on 05/27/2022 at 0502, 0753, 0919, 1036, 1315, 1547. During seizure, EEG showed generalized maximal bifrontal sharply contoured 5 to 7 Hz theta slowing admixed with 2 to 3 Hz delta slowing with evolution in morphology and frequency. Average duration of seizures was around 30 minutes. Hyperventilation and photic stimulation were not performed.   ABNORMALITY - Seizure without clinical signs, generalized - Continuous slow, generalized IMPRESSION: This study showed six seizures without clinical signs with generalized onset on 05/27/2022 at 0502, 0753, 0919, 1036, 1315 and 1547 lasting about 30 minutes each. As medications were adjusted, EEG improved and was suggestive of moderate diffuse encephalopathy. Priyanka 05/29/2022   CT HEAD WO CONTRAST  Result Date: 05/25/2022 CLINICAL DATA:  Head trauma, abnormal mental status.  Seizures. EXAM: CT HEAD WITHOUT CONTRAST CT CERVICAL SPINE WITHOUT CONTRAST TECHNIQUE: Multidetector CT imaging of the head and cervical spine was performed following the standard protocol without intravenous contrast. Multiplanar  CT image reconstructions of the cervical spine were also generated. RADIATION DOSE REDUCTION: This exam was performed according to the departmental dose-optimization program which includes automated exposure control, adjustment of the mA and/or kV according to patient size and/or use of iterative reconstruction technique. COMPARISON:  03/24/2022. FINDINGS: CT HEAD FINDINGS Brain: No acute intracranial hemorrhage, midline shift or mass effect. No extra-axial fluid collection. The previously described subdural hematoma is no longer seen. Periventricular white matter hypodensities are present bilaterally. Encephalomalacia is noted in the frontal and temporal lobes on the left, unchanged. No hydrocephalus. Mild atrophy is noted. Vascular: No hyperdense vessel or unexpected calcification. Skull: Normal. Negative for fracture or focal lesion. Sinuses/Orbits: There is partial  opacification of the maxillary sinuses and ethmoid air cells. No acute orbital abnormality. Other: None. CT CERVICAL SPINE FINDINGS Alignment: Mild retrolisthesis at C5-C6. Skull base and vertebrae: No acute fracture. No primary bone lesion or focal pathologic process. Soft tissues and spinal canal: No prevertebral fluid or swelling. No visible canal hematoma. Disc levels: Multilevel intervertebral disc space narrowing, uncovertebral osteophyte formation, and mild facet arthropathy. Upper chest: Emphysematous changes are noted at the lung apices. Other: Carotid artery calcifications. IMPRESSION: 1. No acute intracranial hemorrhage. The previously described subdural hematoma is no longer seen. 2. Mild atrophy with chronic microvascular ischemic changes. 3. Stable encephalomalacia in the frontal and temporal lobes on the left. 4. Multilevel degenerative changes in the cervical spine without evidence of acute fracture. 5. Emphysema. Electronically Signed   By: Brett Fairy M.D.   On: 05/25/2022 21:57   CT CERVICAL SPINE WO CONTRAST  Result Date:  05/25/2022 CLINICAL DATA:  Head trauma, abnormal mental status.  Seizures. EXAM: CT HEAD WITHOUT CONTRAST CT CERVICAL SPINE WITHOUT CONTRAST TECHNIQUE: Multidetector CT imaging of the head and cervical spine was performed following the standard protocol without intravenous contrast. Multiplanar CT image reconstructions of the cervical spine were also generated. RADIATION DOSE REDUCTION: This exam was performed according to the departmental dose-optimization program which includes automated exposure control, adjustment of the mA and/or kV according to patient size and/or use of iterative reconstruction technique. COMPARISON:  03/24/2022. FINDINGS: CT HEAD FINDINGS Brain: No acute intracranial hemorrhage, midline shift or mass effect. No extra-axial fluid collection. The previously described subdural hematoma is no longer seen. Periventricular white matter hypodensities are present bilaterally. Encephalomalacia is noted in the frontal and temporal lobes on the left, unchanged. No hydrocephalus. Mild atrophy is noted. Vascular: No hyperdense vessel or unexpected calcification. Skull: Normal. Negative for fracture or focal lesion. Sinuses/Orbits: There is partial opacification of the maxillary sinuses and ethmoid air cells. No acute orbital abnormality. Other: None. CT CERVICAL SPINE FINDINGS Alignment: Mild retrolisthesis at C5-C6. Skull base and vertebrae: No acute fracture. No primary bone lesion or focal pathologic process. Soft tissues and spinal canal: No prevertebral fluid or swelling. No visible canal hematoma. Disc levels: Multilevel intervertebral disc space narrowing, uncovertebral osteophyte formation, and mild facet arthropathy. Upper chest: Emphysematous changes are noted at the lung apices. Other: Carotid artery calcifications. IMPRESSION: 1. No acute intracranial hemorrhage. The previously described subdural hematoma is no longer seen. 2. Mild atrophy with chronic microvascular ischemic changes. 3.  Stable encephalomalacia in the frontal and temporal lobes on the left. 4. Multilevel degenerative changes in the cervical spine without evidence of acute fracture. 5. Emphysema. Electronically Signed   By: Brett Fairy M.D.   On: 05/25/2022 21:57    I spent 90  minutes for this patient encounter including review of prior medical records/discussing diagnostics and treatment plan with the patient/family/coordinate care with primary/other specialits with greater than 50% of time in face to face encounter.   Electronically signed by:   Rosiland Oz, MD Infectious Disease Physician [facility] Pager: [phone]

## 2022-05-30 NOTE — Progress Notes (Signed)
  Echocardiogram 2D Echocardiogram has been performed.  Eric Conley 05/30/2022, 4:25 PM

## 2022-05-31 ENCOUNTER — Inpatient Hospital Stay (HOSPITAL_COMMUNITY): Payer: 59

## 2022-05-31 ENCOUNTER — Encounter (HOSPITAL_COMMUNITY): Payer: Self-pay | Admitting: Internal Medicine

## 2022-05-31 ENCOUNTER — Encounter (HOSPITAL_COMMUNITY): Payer: 59

## 2022-05-31 DIAGNOSIS — M7989 Other specified soft tissue disorders: Secondary | ICD-10-CM

## 2022-05-31 DIAGNOSIS — R7881 Bacteremia: Secondary | ICD-10-CM | POA: Diagnosis not present

## 2022-05-31 DIAGNOSIS — R569 Unspecified convulsions: Secondary | ICD-10-CM | POA: Diagnosis not present

## 2022-05-31 DIAGNOSIS — R52 Pain, unspecified: Secondary | ICD-10-CM | POA: Diagnosis not present

## 2022-05-31 DIAGNOSIS — G9341 Metabolic encephalopathy: Secondary | ICD-10-CM | POA: Diagnosis not present

## 2022-05-31 LAB — CBC WITH DIFFERENTIAL/PLATELET
Abs Immature Granulocytes: 0.01 10*3/uL (ref 0.00–0.07)
Basophils Absolute: 0 10*3/uL (ref 0.0–0.1)
Basophils Relative: 1 %
Eosinophils Absolute: 0.1 10*3/uL (ref 0.0–0.5)
Eosinophils Relative: 3 %
HCT: 34.8 % — ABNORMAL LOW (ref 39.0–52.0)
Hemoglobin: 11.6 g/dL — ABNORMAL LOW (ref 13.0–17.0)
Immature Granulocytes: 0 %
Lymphocytes Relative: 40 %
Lymphs Abs: 1.1 10*3/uL (ref 0.7–4.0)
MCH: 29.7 pg (ref 26.0–34.0)
MCHC: 33.3 g/dL (ref 30.0–36.0)
MCV: 89.2 fL (ref 80.0–100.0)
Monocytes Absolute: 0.8 10*3/uL (ref 0.1–1.0)
Monocytes Relative: 29 %
Neutro Abs: 0.7 10*3/uL — ABNORMAL LOW (ref 1.7–7.7)
Neutrophils Relative %: 27 %
Platelets: 194 10*3/uL (ref 150–400)
RBC: 3.9 MIL/uL — ABNORMAL LOW (ref 4.22–5.81)
RDW: 15.6 % — ABNORMAL HIGH (ref 11.5–15.5)
Smear Review: NORMAL
WBC: 2.6 10*3/uL — ABNORMAL LOW (ref 4.0–10.5)
nRBC: 0 % (ref 0.0–0.2)

## 2022-05-31 LAB — COMPREHENSIVE METABOLIC PANEL
ALT: 41 U/L (ref 0–44)
AST: 43 U/L — ABNORMAL HIGH (ref 15–41)
Albumin: 3.1 g/dL — ABNORMAL LOW (ref 3.5–5.0)
Alkaline Phosphatase: 77 U/L (ref 38–126)
Anion gap: 11 (ref 5–15)
BUN: 10 mg/dL (ref 6–20)
CO2: 26 mmol/L (ref 22–32)
Calcium: 9.2 mg/dL (ref 8.9–10.3)
Chloride: 100 mmol/L (ref 98–111)
Creatinine, Ser: 0.64 mg/dL (ref 0.61–1.24)
GFR, Estimated: >60 mL/min (ref >60)
Glucose, Bld: 153 mg/dL — ABNORMAL HIGH (ref 70–99)
Potassium: 4 mmol/L (ref 3.5–5.1)
Sodium: 137 mmol/L (ref 135–145)
Total Bilirubin: 0.3 mg/dL (ref 0.3–1.2)
Total Protein: 6.3 g/dL — ABNORMAL LOW (ref 6.5–8.1)

## 2022-05-31 LAB — MAGNESIUM: Magnesium: 1.7 mg/dL (ref 1.7–2.4)

## 2022-05-31 MED ORDER — TRAZODONE HCL 50 MG PO TABS
50.0000 mg | ORAL_TABLET | Freq: Every evening | ORAL | Status: DC | PRN
  Administered 2022-05-31: 50 mg via ORAL
  Filled 2022-05-31 (×2): qty 1

## 2022-05-31 NOTE — Progress Notes (Signed)
ID Brief note  Attempted to see patient this morning, he was out of the room for venous duplex of UE. There was concerns for swelling at bilateral AC areas overnight and concerns for superficial thrombophlebitis.   TTE 12/25 with no evidence of vegetations or endocarditis  12/26 Venous duplex 12/26 Findings  consistent  with age indeterminate superficial vein thrombosis involving the right  cephalic vein and median cubital vein.    Possible source of MSSA bacteremia is PIV associated superficial thrombophlebitis given nosocomial onset   Recommendations  Continue cefazolin  Fu repeat blood cx. If negative in 48-72 hrs with no new concerns/metastatic involvement, may consider foregoing TEE   D/w Dr Rito Ehrlich  Odette Fraction, MD Infectious Disease Physician [facility] [address] [place], [place] [address] Phone: [phone]  Fax: [phone]

## 2022-05-31 NOTE — TOC Initial Note (Signed)
Transition of Care [facility]) - Initial/Assessment Note    Patient Details  Name: Eric Conley MRN: [id number] Date of Birth: [birth year]  Transition of Care [facility]) CM/SW Contact:    Kermit Balo, RN Phone Number: 05/31/2022, 3:26 PM  Clinical Narrative:                 Current recommendations are for Home health. Awaiting bacteremia work up.  TOC following.  Expected Discharge Plan: Home w Home Health Services Barriers to Discharge: Continued Medical Work up   Patient Goals and CMS Choice   CMS Medicare.gov Compare Post Acute Care list provided to:: Patient Choice offered to / list presented to : Patient      Expected Discharge Plan and Services   Discharge Planning Services: CM Consult Post Acute Care Choice: Home Health Living arrangements for the past 2 months: Single Family Home                                      Prior Living Arrangements/Services Living arrangements for the past 2 months: Single Family Home                     Activities of Daily Living Home Assistive Devices/Equipment: Environmental consultant (specify type) ADL Screening (condition at time of admission) Patient's cognitive ability adequate to safely complete daily activities?: Yes Is the patient deaf or have difficulty hearing?: No Does the patient have difficulty seeing, even when wearing glasses/contacts?: No Does the patient have difficulty concentrating, remembering, or making decisions?: No Patient able to express need for assistance with ADLs?: Yes Does the patient have difficulty dressing or bathing?: No Independently performs ADLs?: Yes (appropriate for developmental age) Does the patient have difficulty walking or climbing stairs?: No Weakness of Legs: None Weakness of Arms/Hands: None  Permission Sought/Granted                  Emotional Assessment              Admission diagnosis:  Seizure (HCC) [R56.9] Seizures (HCC) [R56.9] Patient Active Problem List   Diagnosis  Date Noted   Seizures (HCC) 05/26/2022   Alcohol abuse 05/26/2022   Superficial thrombophlebitis of right forearm 03/31/2022   Cellulitis of arm 03/27/2022   SVT (supraventricular tachycardia) 03/25/2022   Hyperkalemia 03/25/2022   Fever 03/25/2022   Delirium tremens (HCC) 03/23/2022   Alcohol withdrawal seizure with complication (HCC)    Seizure (HCC) 03/20/2022   Hypokalemia 03/20/2022   Hypomagnesemia 03/20/2022   Hypocalcemia 03/20/2022   GERD (gastroesophageal reflux disease) 03/20/2022   Tobacco use disorder 03/20/2022   Wrist pain 03/20/2022   Epidural hematoma (HCC) 01/24/2022   SDH (subdural hematoma) (HCC) 01/24/2022   Severe sepsis (HCC) 12/30/2021   Hyponatremia 01/25/2021   ARF (acute renal failure) (HCC) 01/25/2021    Class: Acute   Diarrhea 01/25/2021    Class: Acute   Dehydration 01/25/2021   Hypotensive episode 01/25/2021    Class: Acute   Essential hypertension 01/25/2021   Severe recurrent major depression without psychotic features (HCC) 01/11/2021   Suicidal thoughts    PCP:  Assunta Found, MD Pharmacy:   [facility] [place], [place] - [address] [place] [address] Phone: [phone] Fax: [phone]     Social Determinants of Health (SDOH) Social History: SDOH Screenings   Food Insecurity: No Food Insecurity (05/31/2022)  Housing:  Low Risk  (05/31/2022)  Transportation Needs: No Transportation Needs (05/31/2022)  Utilities: Not At Risk (05/31/2022)  Tobacco Use: High Risk (05/31/2022)   SDOH Interventions:     Readmission Risk Interventions     No data to display

## 2022-05-31 NOTE — Progress Notes (Signed)
BUE venous duplex has been completed.  Preliminary findings given to [facility], Charity fundraiser.    Results can be found under chart review under CV PROC. 05/31/2022 12:37 PM Shirle Provencal RVT, RDMS

## 2022-05-31 NOTE — Progress Notes (Signed)
PROGRESS NOTE    Eric Conley  WUJ:[id number] DOB: [birth year] DOA: 05/25/2022 PCP: Assunta Found, MD   Brief Narrative:  61 year old male with history of alcohol abuse in the past, seizure disorder, hypertension, subdural hematoma in August 2023 presented with seizures.  Neurology was consulted.  He was started on antiseizure medications and LTM EEG was ordered.  Patient subsequently went into alcohol withdrawal.  Subsequent the patient developed fever.  Blood cultures growing Staph aureus.  Assessment & Plan:   Breakthrough seizures/seizure disorder Patient was seen by neurology.  Noted to be on Keppra and Vimpat.  LTM has been discontinued.  No further obvious seizure activity noted.    Staph aureus bacteremia Developed fever on 12/22.  Had elevated lactic acid level.  Procalcitonin less than 0.1.  Patient started on broad-spectrum antibiotics.   Procalcitonin has increased to 0.41.  Noted to be afebrile. Blood cultures growing Staph aureus.  Antibiotics were changed over to cefazolin. UA was unremarkable.  Chest x-ray does not show any acute findings.  Continue to monitor closely. Source of bacteremia is not clear.  No obvious skin rashes noted.  Patient denies any joint pains.  No recent procedures. He is noted to have some swelling over the bilateral antecubital areas right greater than left.  Could have superficial thrombophlebitis. ID is following.  Transthoracic echocardiogram showed normal systolic function.  No obvious endocarditis was noted.  Blood cultures to be repeated.  Swelling and pain over right and left antecubital areas Doppler studies have been ordered.  Leukopenia Likely due to acute infection.  Lacosamide can also cause neutropenia.  WBC has improved today.  Continue to monitor.   Acute encephalopathy Likely multifactorial including seizures as well as alcohol withdrawal.   Seems to be back to baseline  Alcohol withdrawal/history of alcohol abuse  -Alcohol  level less than 10 on presentation.  Family reported no recent drinking. -Currently on CIWA protocol.  Continue thiamine, multivitamin and folic acid. No evidence for withdrawal currently.  Mild transaminitis Possibly from infection, history of alcoholism.  Seems to be improving.  Hepatitis panel is negative.    Hypokalemia/hypomagnesemia/hyponatremia Supplemented.  Monitor periodically.  Normocytic anemia Hemoglobin is stable.  No evidence of overt bleeding.  Acute metabolic acidosis Resolved  Essential hypertension/sinus tachycardia Patient was on diltiazem and the metoprolol was added for her sinus tachycardia due to concern for rebound tachycardia as he was on metoprolol prior to admission. Blood pressure noted to be soft over the last 48 hours.  Diltiazem has been discontinued.  Blood pressure is stabilized.   Heart rate has stabilized.  DVT prophylaxis: Lovenox Code Status: Full Family Communication: None at bedside Disposition Plan: Home health recommended by physical therapy  Status is: Inpatient Remains inpatient appropriate because: Encephalopathy, seizures  Consultants: Neurology  Procedures: LTM EEG  Antimicrobials: None   Subjective: Complaining of pain in the elbow area on the front in both arms.  Irritable.  Objective: Vitals:   05/31/22 0020 05/31/22 0235 05/31/22 0500 05/31/22 0740  BP: 114/72  105/72 101/68  Pulse: 82  85 93  Resp: 16  16 18   Temp: 98.1 F (36.7 C)  97.9 F (36.6 C) 98 F (36.7 C)  TempSrc: Oral  Oral Oral  SpO2: 98%  99% 99%  Weight:  66.2 kg    Height:        Intake/Output Summary (Last 24 hours) at 05/31/2022 06/02/2022 Last data filed at 05/31/2022 0518 Gross per 24 hour  Intake 960.06 ml  Output 1775  ml  Net -814.94 ml    Filed Weights   05/28/22 0500 05/30/22 0500 05/31/22 0235  Weight: 67.7 kg 66.6 kg 66.2 kg    Examination:  General appearance: Awake alert.  In no distress Resp: Clear to auscultation  bilaterally.  Normal effort Cardio: S1-S2 is normal regular.  No S3-S4.  No rubs murmurs or bruit GI: Abdomen is soft.  Nontender nondistended.  Bowel sounds are present normal.  No masses organomegaly Extremities: Indurated area noted over the right antecubital as well as left antecubital.  Right is larger than left.  Tender to palpate.  Warm to touch.  No obvious erythema noted. Neurologic: Alert and oriented x3.  No focal neurological deficits.     Data Reviewed: I have personally reviewed following labs and imaging studies  CBC: Recent Labs  Lab 05/25/22 2104 05/26/22 0407 05/28/22 0231 05/29/22 0255 05/30/22 0310 05/31/22 0435  WBC 5.9 7.1 9.4 3.1* 1.7* 2.6*  NEUTROABS 3.4  --  7.9*  --  0.8* 0.7*  HGB 13.2 11.5* 12.6* 13.1 12.2* 11.6*  HCT 39.9 34.0* 37.6* 38.1* 35.9* 34.8*  MCV 93.4 91.2 91.0 88.8 88.4 89.2  PLT 256 224 228 192 176 194    Basic Metabolic Panel: Recent Labs  Lab 05/25/22 2104 05/26/22 0407 05/27/22 0340 05/28/22 0231 05/29/22 0255 05/30/22 0310 05/31/22 0435  NA 141   < > 143 135 131* 130* 137  K 3.8   < > 3.2* 4.3 4.1 3.6 4.0  CL 103   < > 107 103 100 97* 100  CO2 15*   < > 25 23 22 25 26   GLUCOSE 119*   < > 100* 142* 118* 123* 153*  BUN 8   < > 7 8 5* 9 10  CREATININE 1.30*   < > 0.91 0.86 0.94 0.77 0.64  CALCIUM 9.5   < > 9.5 9.7 9.2 8.8* 9.2  MG 2.2  --  2.0 1.5* 1.8  --  1.7   < > = values in this interval not displayed.    GFR: Estimated Creatinine Clearance: 93 mL/min (by C-G formula based on SCr of 0.64 mg/dL).  Liver Function Tests: Recent Labs  Lab 05/27/22 0340 05/28/22 0231 05/29/22 0255 05/30/22 0310 05/31/22 0435  AST 17 17 64* 64* 43*  ALT 17 18 54* 56* 41  ALKPHOS 74 75 72 67 77  BILITOT 0.6 0.6 0.6 0.2* 0.3  PROT 6.4* 6.9 6.7 6.4* 6.3*  ALBUMIN 3.5 3.8 3.5 3.1* 3.1*    Coagulation Profile: Recent Labs  Lab 05/25/22 2104 05/28/22 0231  INR 1.0 1.0     CBG: Recent Labs  Lab 05/25/22 2103  GLUCAP  126*     Sepsis Labs: Recent Labs  Lab 05/28/22 0231 05/28/22 0346 05/28/22 0749 05/29/22 0255 05/30/22 0310  PROCALCITON <0.10  --   --  0.37 0.41  LATICACIDVEN 1.0 2.9* 2.7*  --   --      Recent Results (from the past 240 hour(s))  Culture, blood (x 2)     Status: Abnormal   Collection Time: 05/28/22  2:29 AM   Specimen: BLOOD  Result Value Ref Range Status   Specimen Description BLOOD SITE NOT SPECIFIED  Final   Special Requests   Final    BOTTLES DRAWN AEROBIC AND ANAEROBIC Blood Culture results may not be optimal due to an excessive volume of blood received in culture bottles   Culture  Setup Time   Final    IN BOTH AEROBIC  AND ANAEROBIC BOTTLES GRAM POSITIVE COCCI IN CHAINS IN CLUSTERS Organism ID to follow CRITICAL RESULT CALLED TO, READ BACK BY AND VERIFIED WITH:  C/ PHARMD K. HURTH 05/28/22 1646 A. LAFRANCE Performed at [facility], [address], [place], [place] [address]    Culture STAPHYLOCOCCUS AUREUS (A)  Final   Report Status 05/30/2022 FINAL  Final   Organism ID, Bacteria STAPHYLOCOCCUS AUREUS  Final      Susceptibility   Staphylococcus aureus - MIC*    CIPROFLOXACIN <=0.5 SENSITIVE Sensitive     ERYTHROMYCIN <=0.25 SENSITIVE Sensitive     GENTAMICIN <=0.5 SENSITIVE Sensitive     OXACILLIN 0.5 SENSITIVE Sensitive     TETRACYCLINE <=1 SENSITIVE Sensitive     VANCOMYCIN 1 SENSITIVE Sensitive     TRIMETH/SULFA <=10 SENSITIVE Sensitive     CLINDAMYCIN <=0.25 SENSITIVE Sensitive     RIFAMPIN <=0.5 SENSITIVE Sensitive     Inducible Clindamycin NEGATIVE Sensitive     * STAPHYLOCOCCUS AUREUS  Blood Culture ID Panel (Reflexed)     Status: Abnormal   Collection Time: 05/28/22  2:29 AM  Result Value Ref Range Status   Enterococcus faecalis NOT DETECTED NOT DETECTED Final   Enterococcus Faecium NOT DETECTED NOT DETECTED Final   Listeria monocytogenes NOT DETECTED NOT DETECTED Final   Staphylococcus species DETECTED (A) NOT DETECTED Final     Comment: CRITICAL RESULT CALLED TO, READ BACK BY AND VERIFIED WITH:  C/ PHARMD K. HURTH 05/28/22 1646 A. LAFRANCE    Staphylococcus aureus (BCID) DETECTED (A) NOT DETECTED Final    Comment: CRITICAL RESULT CALLED TO, READ BACK BY AND VERIFIED WITH:  C/ PHARMD K. HURTH 05/28/22 1646 A. LAFRANCE    Staphylococcus epidermidis NOT DETECTED NOT DETECTED Final   Staphylococcus lugdunensis NOT DETECTED NOT DETECTED Final   Streptococcus species NOT DETECTED NOT DETECTED Final   Streptococcus agalactiae NOT DETECTED NOT DETECTED Final   Streptococcus pneumoniae NOT DETECTED NOT DETECTED Final   Streptococcus pyogenes NOT DETECTED NOT DETECTED Final   A.calcoaceticus-baumannii NOT DETECTED NOT DETECTED Final   Bacteroides fragilis NOT DETECTED NOT DETECTED Final   Enterobacterales NOT DETECTED NOT DETECTED Final   Enterobacter cloacae complex NOT DETECTED NOT DETECTED Final   Escherichia coli NOT DETECTED NOT DETECTED Final   Klebsiella aerogenes NOT DETECTED NOT DETECTED Final   Klebsiella oxytoca NOT DETECTED NOT DETECTED Final   Klebsiella pneumoniae NOT DETECTED NOT DETECTED Final   Proteus species NOT DETECTED NOT DETECTED Final   Salmonella species NOT DETECTED NOT DETECTED Final   Serratia marcescens NOT DETECTED NOT DETECTED Final   Haemophilus influenzae NOT DETECTED NOT DETECTED Final   Neisseria meningitidis NOT DETECTED NOT DETECTED Final   Pseudomonas aeruginosa NOT DETECTED NOT DETECTED Final   Stenotrophomonas maltophilia NOT DETECTED NOT DETECTED Final   Candida albicans NOT DETECTED NOT DETECTED Final   Candida auris NOT DETECTED NOT DETECTED Final   Candida glabrata NOT DETECTED NOT DETECTED Final   Candida krusei NOT DETECTED NOT DETECTED Final   Candida parapsilosis NOT DETECTED NOT DETECTED Final   Candida tropicalis NOT DETECTED NOT DETECTED Final   Cryptococcus neoformans/gattii NOT DETECTED NOT DETECTED Final   Meth resistant mecA/C and MREJ NOT DETECTED NOT DETECTED  Final    Comment: Performed at [facility], [address], [place], [place] [address]  Culture, blood (x 2)     Status: Abnormal   Collection Time: 05/28/22  2:31 AM   Specimen: BLOOD RIGHT ARM  Result Value  Ref Range Status   Specimen Description BLOOD RIGHT ARM  Final   Special Requests   Final    BOTTLES DRAWN AEROBIC AND ANAEROBIC Blood Culture results may not be optimal due to an excessive volume of blood received in culture bottles   Culture  Setup Time   Final    IN BOTH AEROBIC AND ANAEROBIC BOTTLES GRAM POSITIVE COCCI IN CHAINS CRITICAL VALUE NOTED.  VALUE IS CONSISTENT WITH PREVIOUSLY REPORTED AND CALLED VALUE.    Culture (A)  Final    STAPHYLOCOCCUS AUREUS SUSCEPTIBILITIES PERFORMED ON PREVIOUS CULTURE WITHIN THE LAST 5 DAYS. Performed at [facility], [address] [place], [place] [address]    Report Status 05/30/2022 FINAL  Final      Radiology Studies: ECHOCARDIOGRAM LIMITED  Result Date: 05/30/2022    ECHOCARDIOGRAM REPORT   Patient Name:   Eric Conley [facility] Date of Exam: 05/30/2022 Medical Rec #:  [id number]    Height:       67.0 in Accession #:    [id number]   Weight:       146.8 lb Date of Birth:  [birth year]   BSA:          1.773 m Patient Age:    61 years     BP:           123/83 mmHg Patient Gender: M            HR:           88 bpm. Exam Location:  Inpatient Procedure: Limited Echo, Color Doppler and Cardiac Doppler Indications:    Bacteremia  History:        Patient has prior history of Echocardiogram examinations, most                 recent 03/25/2022. Risk Factors:Hypertension.  Sonographer:    Delcie Roch RDCS Referring Phys: [id number] [facility] IMPRESSIONS  1. Left ventricular ejection fraction, by estimation, is 60 to 65%. The left ventricle has normal function. The left ventricle has no regional wall motion abnormalities. Left ventricular diastolic parameters are consistent with Grade I diastolic dysfunction (impaired relaxation).  2.  Right ventricular systolic function is normal. The right ventricular size is normal.  3. The mitral valve is normal in structure. No evidence of mitral valve regurgitation. No evidence of mitral stenosis.  4. The aortic valve is tricuspid. Aortic valve regurgitation is not visualized. No aortic stenosis is present.  5. The inferior vena cava is normal in size with greater than 50% respiratory variability, suggesting right atrial pressure of 3 mmHg. FINDINGS  Left Ventricle: Left ventricular ejection fraction, by estimation, is 60 to 65%. The left ventricle has normal function. The left ventricle has no regional wall motion abnormalities. The left ventricular internal cavity size was normal in size. There is  no left ventricular hypertrophy. Left ventricular diastolic parameters are consistent with Grade I diastolic dysfunction (impaired relaxation). Right Ventricle: The right ventricular size is normal. No increase in right ventricular wall thickness. Right ventricular systolic function is normal. Left Atrium: Left atrial size was normal in size. Right Atrium: Right atrial size was normal in size. Pericardium: There is no evidence of pericardial effusion. Mitral Valve: The mitral valve is normal in structure. No evidence of mitral valve regurgitation. No evidence of mitral valve stenosis. Tricuspid Valve: The tricuspid valve is normal in structure. Tricuspid valve regurgitation is not demonstrated. No evidence of tricuspid stenosis. Aortic Valve: The aortic valve is tricuspid. Aortic valve  regurgitation is not visualized. No aortic stenosis is present. Pulmonic Valve: The pulmonic valve was normal in structure. Pulmonic valve regurgitation is trivial. No evidence of pulmonic stenosis. Aorta: The aortic root is normal in size and structure. Venous: The inferior vena cava is normal in size with greater than 50% respiratory variability, suggesting right atrial pressure of 3 mmHg. IAS/Shunts: No atrial level shunt  detected by color flow Doppler.  IVC IVC diam: 1.20 cm AORTIC VALVE LVOT Vmax:   87.30 cm/s LVOT Vmean:  56.800 cm/s LVOT VTI:    0.145 m MITRAL VALVE MV Area (PHT): 3.99 cm    SHUNTS MV Decel Time: 190 msec    Systemic VTI: 0.14 m MV E velocity: 48.70 cm/s MV A velocity: 64.30 cm/s MV E/A ratio:  0.76 Chilton Si MD Electronically signed by Chilton Si MD Signature Date/Time: 05/30/2022/6:13:16 PM    Final       Scheduled Meds:  enoxaparin (LOVENOX) injection  40 mg Subcutaneous Q24H   folic acid  1 mg Oral Daily   lacosamide  100 mg Oral BID   levETIRAcetam  1,500 mg Oral BID   metoprolol tartrate  25 mg Oral BID   multivitamin with minerals  1 tablet Oral Daily   thiamine  100 mg Oral Daily   Or   thiamine  100 mg Intravenous Daily   Continuous Infusions:   ceFAZolin (ANCEF) IV 2 g (05/31/22 0235)      Osvaldo Shipper, MD [facility] 05/31/2022, 9:38 AM

## 2022-05-31 NOTE — Progress Notes (Addendum)
Tonight on assessment, two subcutaneous nodules to right antecubital region noted. This started tonight, per pt. They are red and tender to touch, round and hard. Size of big blubbery. Distal pulses intact, skin warm to touch, distal redness due to pt itching/dry skin. Pt reported similar situation earlier this year while he was admitted to [facility]. Cannot remember clearly what it was, but he believes it was condition related to his veins.  Loney Loh, MD notified. Per Loney Loh, MD, continue to monitor site, and make day shift RN aware. New order for [place] placed.

## 2022-05-31 NOTE — Consult Note (Signed)
WOC consulted for RLQ wound, verified with bedside nurse no open wounds. Will not consult for that reason.  Chika Cichowski [facility], CNS, [employer] [phone]

## 2022-05-31 NOTE — Progress Notes (Signed)
Physical Therapy Treatment Patient Details Name: Eric Conley MRN: [id number] DOB: 11-26-1960 Today's Date: 05/31/2022   History of Present Illness Patient is a 61 y/o male who presents on 12/20 with seizure activity accompanied by UI. Also went into alcohol withdrawal and developed fever of unclear etiology. PMH includes depression, HTN, alcohol dependence, SDH in August 2023.    PT Comments    Pt received supine and agreeable to session with encouragement with continued progress towards goals. Pt requiring grossly min guard assist for all OOB mobility secondary to impaired balance/postural reactions, decreased insight into deficits, cognitive deficits and fatigue. Pt demonstrating gait without AD with no overt LOB, however pt reaching out for hallway rail x2 for support. Current plan remains appropriate to address deficits and maximize functional independence and safety. Pt continues to benefit from skilled PT services to progress toward functional mobility goals.    Recommendations for follow up therapy are one component of a multi-disciplinary discharge planning process, led by the attending physician.  Recommendations may be updated based on patient status, additional functional criteria and insurance authorization.  Follow Up Recommendations  Home health PT     Assistance Recommended at Discharge Intermittent Supervision/Assistance  Patient can return home with the following A little help with walking and/or transfers;A little help with bathing/dressing/bathroom;Help with stairs or ramp for entrance;Assist for transportation;Assistance with cooking/housework   Equipment Recommendations  None recommended by PT    Recommendations for Other Services       Precautions / Restrictions Precautions Precautions: Fall Restrictions Weight Bearing Restrictions: No     Mobility  Bed Mobility Overal bed mobility: Needs Assistance Bed Mobility: Supine to Sit, Sit to Supine     Supine  to sit: Supervision, HOB elevated     General bed mobility comments: No assist needed, increased time.    Transfers Overall transfer level: Needs assistance Equipment used: None Transfers: Sit to/from Stand Sit to Stand: Min guard           General transfer comment: Min guard A for safety    Ambulation/Gait Ambulation/Gait assistance: Min guard Gait Distance (Feet): 350 Feet Assistive device: None Gait Pattern/deviations: Step-through pattern, Decreased stride length Gait velocity: WFL     General Gait Details: slightly unsteady gait without AD, no overt LOB, distance llimiited to pt stated toleracne secondary to fatigue   Stairs Stairs: Yes Stairs assistance: Min guard Stair Management: One rail Right, Alternating pattern, Forwards Number of Stairs: 5 General stair comments: step over step pattern with no LOB   Wheelchair Mobility    Modified Rankin (Stroke Patients Only)       Balance Overall balance assessment: Needs assistance Sitting-balance support: Feet supported, No upper extremity supported Sitting balance-Leahy Scale: Fair     Standing balance support: During functional activity, Single extremity supported Standing balance-Leahy Scale: Fair                              Cognition Arousal/Alertness: Awake/alert Behavior During Therapy: Flat affect Overall Cognitive Status: Impaired/Different from baseline Area of Impairment: Memory, Safety/judgement, Awareness, Problem solving                     Memory: Decreased short-term memory   Safety/Judgement: Decreased awareness of deficits, Decreased awareness of safety              Exercises      General Comments General comments (skin integrity, edema, etc.): HR max  118      Pertinent Vitals/Pain Pain Assessment Pain Assessment: Faces Faces Pain Scale: Hurts a little bit Pain Location: "all over" Pain Descriptors / Indicators: Sore Pain Intervention(s): Limited  activity within patient's tolerance, Monitored during session, Repositioned    Home Living                          Prior Function            PT Goals (current goals can now be found in the care plan section) Acute Rehab PT Goals Patient Stated Goal: to rest PT Goal Formulation: With patient Time For Goal Achievement: 06/12/22 Progress towards PT goals: Progressing toward goals    Frequency    Min 3X/week      PT Plan      Co-evaluation              AM-PAC PT "6 Clicks" Mobility   Outcome Measure  Help needed turning from your back to your side while in a flat bed without using bedrails?: A Little Help needed moving from lying on your back to sitting on the side of a flat bed without using bedrails?: A Little Help needed moving to and from a bed to a chair (including a wheelchair)?: A Little Help needed standing up from a chair using your arms (e.g., wheelchair or bedside chair)?: A Little Help needed to walk in hospital room?: A Little Help needed climbing 3-5 steps with a railing? : A Little 6 Click Score: 18    End of Session Equipment Utilized During Treatment: Gait belt Activity Tolerance: Patient tolerated treatment well Patient left: with call bell/phone within reach;in chair;with chair alarm set Nurse Communication: Mobility status PT Visit Diagnosis: Muscle weakness (generalized) (M62.81);Difficulty in walking, not elsewhere classified (R26.2);Unsteadiness on feet (R26.81)     Time: 3244-0102 PT Time Calculation (min) (ACUTE ONLY): 13 min  Charges:  $Gait Training: 8-22 mins                     Adamary Savary R. PTA Acute Rehabilitation Services Office: [phone]    Catalina Antigua 05/31/2022, 9:05 AM

## 2022-05-31 NOTE — Progress Notes (Signed)
   05/31/22 1226  Provider Notification  Provider Name/Title Dr. Rito Ehrlich Attending  Date Provider Notified 05/31/22  Time Provider Notified 1226  Method of Notification Page  Notification Reason Other (Comment) (Positive for superficial clot in right arm)  Provider response Evaluate remotely  Date of Provider Response 05/31/22  Time of Provider Response 1227   3W13 Eric Conley. Went for [place], positive for superficial clot in right arm, negative for DVT. [place] [employer] called that over. Thanks [employer]

## 2022-06-01 DIAGNOSIS — R569 Unspecified convulsions: Secondary | ICD-10-CM | POA: Diagnosis not present

## 2022-06-01 LAB — CBC WITH DIFFERENTIAL/PLATELET
Abs Immature Granulocytes: 0.01 10*3/uL (ref 0.00–0.07)
Basophils Absolute: 0 10*3/uL (ref 0.0–0.1)
Basophils Relative: 1 %
Eosinophils Absolute: 0.1 10*3/uL (ref 0.0–0.5)
Eosinophils Relative: 2 %
HCT: 34.4 % — ABNORMAL LOW (ref 39.0–52.0)
Hemoglobin: 11.5 g/dL — ABNORMAL LOW (ref 13.0–17.0)
Immature Granulocytes: 0 %
Lymphocytes Relative: 43 %
Lymphs Abs: 1.5 10*3/uL (ref 0.7–4.0)
MCH: 30 pg (ref 26.0–34.0)
MCHC: 33.4 g/dL (ref 30.0–36.0)
MCV: 89.8 fL (ref 80.0–100.0)
Monocytes Absolute: 1 10*3/uL (ref 0.1–1.0)
Monocytes Relative: 30 %
Neutro Abs: 0.8 10*3/uL — ABNORMAL LOW (ref 1.7–7.7)
Neutrophils Relative %: 24 %
Platelets: 215 10*3/uL (ref 150–400)
RBC: 3.83 MIL/uL — ABNORMAL LOW (ref 4.22–5.81)
RDW: 15.4 % (ref 11.5–15.5)
WBC: 3.3 10*3/uL — ABNORMAL LOW (ref 4.0–10.5)
nRBC: 0 % (ref 0.0–0.2)

## 2022-06-01 LAB — COMPREHENSIVE METABOLIC PANEL
ALT: 30 U/L (ref 0–44)
AST: 31 U/L (ref 15–41)
Albumin: 3.1 g/dL — ABNORMAL LOW (ref 3.5–5.0)
Alkaline Phosphatase: 77 U/L (ref 38–126)
Anion gap: 6 (ref 5–15)
BUN: 8 mg/dL (ref 6–20)
CO2: 26 mmol/L (ref 22–32)
Calcium: 9.2 mg/dL (ref 8.9–10.3)
Chloride: 105 mmol/L (ref 98–111)
Creatinine, Ser: 0.71 mg/dL (ref 0.61–1.24)
GFR, Estimated: >60 mL/min (ref >60)
Glucose, Bld: 118 mg/dL — ABNORMAL HIGH (ref 70–99)
Potassium: 3.8 mmol/L (ref 3.5–5.1)
Sodium: 137 mmol/L (ref 135–145)
Total Bilirubin: 0.2 mg/dL — ABNORMAL LOW (ref 0.3–1.2)
Total Protein: 6.3 g/dL — ABNORMAL LOW (ref 6.5–8.1)

## 2022-06-01 LAB — MAGNESIUM: Magnesium: 1.8 mg/dL (ref 1.7–2.4)

## 2022-06-01 LAB — PATHOLOGIST SMEAR REVIEW

## 2022-06-01 NOTE — Progress Notes (Signed)
PROGRESS NOTE    Eric Conley  BPZ:[id number] DOB: 1960-11-18 DOA: 05/25/2022 PCP: Assunta Found, MD   Brief Narrative:  61 year old male with history of alcohol abuse in the past, seizure disorder, hypertension, subdural hematoma in August 2023 presented with seizures.  Neurology was consulted.  He was started on antiseizure medications and LTM EEG was ordered.  Patient subsequently went into alcohol withdrawal.  Subsequent the patient developed fever.  Blood cultures growing Staph aureus.  Assessment & Plan:   Breakthrough seizures/seizure disorder Patient was seen by neurology.  Noted to be on Keppra and Vimpat.   LTM has been discontinued - no further obvious seizure activity noted.    Sepsis secondary to aureus bacteremia, likely POA Developed fever on 12/22 with notable leukopenia and elevated lactic acid level Initial blood cultures growing Staph aureus.   Continue cefazolin, appreciate ID recommendations Unclear source of infection given no other infection, no skin lesions recent procedures  Superficial bilateral antecubital erythema, ultrasound consistent with age-indeterminate superficial venous thrombosis on the right, no acute findings in the left Pending repeat cultures may be able to forego TEE given negative TTE findings  Superficial vein thrombosis of R cephalic vein and medial cubital vein Noted on doppler studies  Continue hot compresses and supportive care Left AC ultrasound unremarkable  Acute encephalopathy, resolved Likely multifactorial including seizures as well as alcohol withdrawal.   Back to baseline  Alcohol withdrawal/history of alcohol abuse  Alcohol level less than 10 on presentation.  Family reported no recent drinking. Currently on CIWA protocol.  Continue thiamine, multivitamin and folic acid. No evidence for withdrawal currently.  Mild transaminitis Possibly from infection, history of alcoholism.  Seems to be improving.  Hepatitis panel is  negative.    Hypokalemia/hypomagnesemia/hyponatremia Supplemented.  Monitor periodically.  Normocytic anemia Hemoglobin is stable.  No evidence of overt bleeding.  Acute metabolic acidosis Resolved  Essential hypertension/sinus tachycardia Patient was on diltiazem and the metoprolol was added for her sinus tachycardia due to concern for rebound tachycardia as he was on metoprolol prior to admission. Blood pressure noted to be soft over the last 48 hours.  Diltiazem has been discontinued.  Blood pressure is stabilized.   Heart rate has stabilized.  DVT prophylaxis: Lovenox Code Status: Full Family Communication: None at bedside Disposition Plan: Home health recommended by physical therapy  Status is: Inpatient Remains inpatient appropriate because: Encephalopathy, seizures  Consultants: Neurology  Procedures: LTM EEG  Antimicrobials: None   Subjective: Complaining of pain in the elbow area on the front in both arms.  Irritable.  Objective: Vitals:   05/31/22 1922 05/31/22 2136 06/01/22 0100 06/01/22 0500  BP: 128/69 125/65 107/75 112/65  Pulse: 93 90 82 91  Resp: 16  16 16   Temp: 97.8 F (36.6 C)  98 F (36.7 C) 98 F (36.7 C)  TempSrc: Oral  Oral Oral  SpO2: 97%  96% 95%  Weight:      Height:        Intake/Output Summary (Last 24 hours) at 06/01/2022 0750 Last data filed at 06/01/2022 0600 Gross per 24 hour  Intake --  Output 1800 ml  Net -1800 ml    Filed Weights   05/28/22 0500 05/30/22 0500 05/31/22 0235  Weight: 67.7 kg 66.6 kg 66.2 kg    Examination:  General appearance: Awake alert.  In no distress Resp: Clear to auscultation bilaterally.  Normal effort Cardio: S1-S2 is normal regular.  No S3-S4.  No rubs murmurs or bruit GI: Abdomen is soft.  Nontender nondistended.  Bowel sounds are present normal.  No masses organomegaly Extremities: Indurated area noted over the right antecubital as well as left antecubital.  Right is larger than left.   Tender to palpate.  Warm to touch.  No obvious erythema noted. Neurologic: Alert and oriented x3.  No focal neurological deficits.     Data Reviewed: I have personally reviewed following labs and imaging studies  CBC: Recent Labs  Lab 05/25/22 2104 05/26/22 0407 05/28/22 0231 05/29/22 0255 05/30/22 0310 05/31/22 0435 06/01/22 0119  WBC 5.9   < > 9.4 3.1* 1.7* 2.6* 3.3*  NEUTROABS 3.4  --  7.9*  --  0.8* 0.7* 0.8*  HGB 13.2   < > 12.6* 13.1 12.2* 11.6* 11.5*  HCT 39.9   < > 37.6* 38.1* 35.9* 34.8* 34.4*  MCV 93.4   < > 91.0 88.8 88.4 89.2 89.8  PLT 256   < > 228 192 176 194 215   < > = values in this interval not displayed.    Basic Metabolic Panel: Recent Labs  Lab 05/27/22 0340 05/28/22 0231 05/29/22 0255 05/30/22 0310 05/31/22 0435 06/01/22 0119  NA 143 135 131* 130* 137 137  K 3.2* 4.3 4.1 3.6 4.0 3.8  CL 107 103 100 97* 100 105  CO2 GLUCOSE 100* 142* 118* 123* 153* 118*  BUN 7 8 5* CREATININE 0.91 0.86 0.94 0.77 0.64 0.71  CALCIUM 9.5 9.7 9.2 8.8* 9.2 9.2  MG 2.0 1.5* 1.8  --  1.7 1.8    GFR: Estimated Creatinine Clearance: 93 mL/min (by C-G formula based on SCr of 0.71 mg/dL).  Liver Function Tests: Recent Labs  Lab 05/28/22 0231 05/29/22 0255 05/30/22 0310 05/31/22 0435 06/01/22 0119  AST 17 64* 64* 43* 31  ALT 18 54* 56* 41 30  ALKPHOS 75 72 67 77 77  BILITOT 0.6 0.6 0.2* 0.3 0.2*  PROT 6.9 6.7 6.4* 6.3* 6.3*  ALBUMIN 3.8 3.5 3.1* 3.1* 3.1*    Coagulation Profile: Recent Labs  Lab 05/25/22 2104 05/28/22 0231  INR 1.0 1.0     CBG: Recent Labs  Lab 05/25/22 2103  GLUCAP 126*     Sepsis Labs: Recent Labs  Lab 05/28/22 0231 05/28/22 0346 05/28/22 0749 05/29/22 0255 05/30/22 0310  PROCALCITON <0.10  --   --  0.37 0.41  LATICACIDVEN 1.0 2.9* 2.7*  --   --      Recent Results (from the past 240 hour(s))  Culture, blood (x 2)     Status: Abnormal   Collection Time: 05/28/22  2:29 AM   Specimen:  BLOOD  Result Value Ref Range Status   Specimen Description BLOOD SITE NOT SPECIFIED  Final   Special Requests   Final    BOTTLES DRAWN AEROBIC AND ANAEROBIC Blood Culture results may not be optimal due to an excessive volume of blood received in culture bottles   Culture  Setup Time   Final    IN BOTH AEROBIC AND ANAEROBIC BOTTLES GRAM POSITIVE COCCI IN CHAINS IN CLUSTERS Organism ID to follow CRITICAL RESULT CALLED TO, READ BACK BY AND VERIFIED WITH:  C/ PHARMD K. HURTH 05/28/22 1646 A. LAFRANCE Performed at [facility], [address], [place], [place] [address]    Culture STAPHYLOCOCCUS AUREUS (A)  Final   Report Status 05/30/2022 FINAL  Final   Organism ID, Bacteria STAPHYLOCOCCUS AUREUS  Final      Susceptibility  Staphylococcus aureus - MIC*    CIPROFLOXACIN <=0.5 SENSITIVE Sensitive     ERYTHROMYCIN <=0.25 SENSITIVE Sensitive     GENTAMICIN <=0.5 SENSITIVE Sensitive     OXACILLIN 0.5 SENSITIVE Sensitive     TETRACYCLINE <=1 SENSITIVE Sensitive     VANCOMYCIN 1 SENSITIVE Sensitive     TRIMETH/SULFA <=10 SENSITIVE Sensitive     CLINDAMYCIN <=0.25 SENSITIVE Sensitive     RIFAMPIN <=0.5 SENSITIVE Sensitive     Inducible Clindamycin NEGATIVE Sensitive     * STAPHYLOCOCCUS AUREUS  Blood Culture ID Panel (Reflexed)     Status: Abnormal   Collection Time: 05/28/22  2:29 AM  Result Value Ref Range Status   Enterococcus faecalis NOT DETECTED NOT DETECTED Final   Enterococcus Faecium NOT DETECTED NOT DETECTED Final   Listeria monocytogenes NOT DETECTED NOT DETECTED Final   Staphylococcus species DETECTED (A) NOT DETECTED Final    Comment: CRITICAL RESULT CALLED TO, READ BACK BY AND VERIFIED WITH:  C/ PHARMD K. HURTH 05/28/22 1646 A. LAFRANCE    Staphylococcus aureus (BCID) DETECTED (A) NOT DETECTED Final    Comment: CRITICAL RESULT CALLED TO, READ BACK BY AND VERIFIED WITH:  C/ PHARMD K. HURTH 05/28/22 1646 A. LAFRANCE    Staphylococcus epidermidis NOT DETECTED NOT  DETECTED Final   Staphylococcus lugdunensis NOT DETECTED NOT DETECTED Final   Streptococcus species NOT DETECTED NOT DETECTED Final   Streptococcus agalactiae NOT DETECTED NOT DETECTED Final   Streptococcus pneumoniae NOT DETECTED NOT DETECTED Final   Streptococcus pyogenes NOT DETECTED NOT DETECTED Final   A.calcoaceticus-baumannii NOT DETECTED NOT DETECTED Final   Bacteroides fragilis NOT DETECTED NOT DETECTED Final   Enterobacterales NOT DETECTED NOT DETECTED Final   Enterobacter cloacae complex NOT DETECTED NOT DETECTED Final   Escherichia coli NOT DETECTED NOT DETECTED Final   Klebsiella aerogenes NOT DETECTED NOT DETECTED Final   Klebsiella oxytoca NOT DETECTED NOT DETECTED Final   Klebsiella pneumoniae NOT DETECTED NOT DETECTED Final   Proteus species NOT DETECTED NOT DETECTED Final   Salmonella species NOT DETECTED NOT DETECTED Final   Serratia marcescens NOT DETECTED NOT DETECTED Final   Haemophilus influenzae NOT DETECTED NOT DETECTED Final   Neisseria meningitidis NOT DETECTED NOT DETECTED Final   Pseudomonas aeruginosa NOT DETECTED NOT DETECTED Final   Stenotrophomonas maltophilia NOT DETECTED NOT DETECTED Final   Candida albicans NOT DETECTED NOT DETECTED Final   Candida auris NOT DETECTED NOT DETECTED Final   Candida glabrata NOT DETECTED NOT DETECTED Final   Candida krusei NOT DETECTED NOT DETECTED Final   Candida parapsilosis NOT DETECTED NOT DETECTED Final   Candida tropicalis NOT DETECTED NOT DETECTED Final   Cryptococcus neoformans/gattii NOT DETECTED NOT DETECTED Final   Meth resistant mecA/C and MREJ NOT DETECTED NOT DETECTED Final    Comment: Performed at [facility], [address], [place], [place] [address]  Culture, blood (x 2)     Status: Abnormal   Collection Time: 05/28/22  2:31 AM   Specimen: BLOOD RIGHT ARM  Result Value Ref Range Status   Specimen Description BLOOD RIGHT ARM  Final   Special Requests   Final    BOTTLES DRAWN AEROBIC AND  ANAEROBIC Blood Culture results may not be optimal due to an excessive volume of blood received in culture bottles   Culture  Setup Time   Final    IN BOTH AEROBIC AND ANAEROBIC BOTTLES GRAM POSITIVE COCCI IN CHAINS CRITICAL VALUE NOTED.  VALUE IS CONSISTENT WITH PREVIOUSLY REPORTED AND CALLED  VALUE.    Culture (A)  Final    STAPHYLOCOCCUS AUREUS SUSCEPTIBILITIES PERFORMED ON PREVIOUS CULTURE WITHIN THE LAST 5 DAYS. Performed at [facility], [address] [place], [place] [address]    Report Status 05/30/2022 FINAL  Final  Culture, blood (Routine X 2) w Reflex to ID Panel     Status: None (Preliminary result)   Collection Time: 05/30/22 11:05 AM   Specimen: BLOOD  Result Value Ref Range Status   Specimen Description BLOOD LEFT ANTECUBITAL  Final   Special Requests   Final    BOTTLES DRAWN AEROBIC AND ANAEROBIC Blood Culture adequate volume   Culture   Final    NO GROWTH < 24 HOURS Performed at [facility], [address] [place], [place] [address]    Report Status PENDING  Incomplete  Culture, blood (Routine X 2) w Reflex to ID Panel     Status: None (Preliminary result)   Collection Time: 05/30/22 11:05 AM   Specimen: BLOOD LEFT HAND  Result Value Ref Range Status   Specimen Description BLOOD LEFT HAND  Final   Special Requests   Final    BOTTLES DRAWN AEROBIC AND ANAEROBIC Blood Culture adequate volume   Culture   Final    NO GROWTH < 24 HOURS Performed at [facility], [address] [place], [place] [address]    Report Status PENDING  Incomplete      Radiology Studies: VAS US UPPER EXTREMITY VENOUS DUPLEX  Result Date: 05/31/2022 UPPER VENOUS STUDY  Patient [facility]  Date of Exam:   05/31/2022 Medical Rec #: [id number]     Accession #:    [id number] Date of Birth: 1960/10/14    Patient Gender: M Patient Age:   [age] years [facility]  Procedure:      VAS US UPPER EXTREMITY VENOUS DUPLEX Referring Phys: Osvaldo ShipperGOKUL KRISHNAN  --------------------------------------------------------------------------------  Indications: Pain, swelling RUE Limitations: Patient movement. Comparison Study: Previous RUEV on 03/30/22 was positive for SVT in cephalic                   vein Performing Technologist: Ernestene MentionJody Hill RVT, RDMS  Examination Guidelines: A complete evaluation includes B-mode imaging, spectral Doppler, color Doppler, and power Doppler as needed of all accessible portions of each vessel. Bilateral testing is considered an integral part of a complete examination. Limited examinations for reoccurring indications may be performed as noted.  Right Findings: +----------+------------+---------+-----------+----------+-----------------+ RIGHT     CompressiblePhasicitySpontaneousProperties     Summary      +----------+------------+---------+-----------+----------+-----------------+ IJV           Full       Yes       Yes                                +----------+------------+---------+-----------+----------+-----------------+ Subclavian    Full       Yes       Yes                                +----------+------------+---------+-----------+----------+-----------------+ Axillary      Full       Yes       Yes                                +----------+------------+---------+-----------+----------+-----------------+  Brachial      Full       Yes       Yes                                +----------+------------+---------+-----------+----------+-----------------+ Radial        Full                                                    +----------+------------+---------+-----------+----------+-----------------+ Ulnar         Full                                                    +----------+------------+---------+-----------+----------+-----------------+ Cephalic      None       No        No               Age Indeterminate +----------+------------+---------+-----------+----------+-----------------+  Basilic       Full       Yes       Yes                                +----------+------------+---------+-----------+----------+-----------------+  Left Findings: +----------+------------+---------+-----------+----------+-------+ LEFT      CompressiblePhasicitySpontaneousPropertiesSummary +----------+------------+---------+-----------+----------+-------+ IJV           Full       No        Yes                      +----------+------------+---------+-----------+----------+-------+ Subclavian    Full                                          +----------+------------+---------+-----------+----------+-------+ Axillary      Full       Yes       Yes                      +----------+------------+---------+-----------+----------+-------+ Brachial      Full       Yes       Yes                      +----------+------------+---------+-----------+----------+-------+ Radial        Full       Yes       Yes                      +----------+------------+---------+-----------+----------+-------+ Ulnar         Full                                          +----------+------------+---------+-----------+----------+-------+ Cephalic      Full       Yes       Yes                      +----------+------------+---------+-----------+----------+-------+  Basilic       Full                                          +----------+------------+---------+-----------+----------+-------+  Summary:  Right: No evidence of deep vein thrombosis in the upper extremity. Findings consistent with age indeterminate superficial vein thrombosis involving the right cephalic vein and median cubital vein.  Left: No evidence of deep vein thrombosis in the upper extremity. No evidence of superficial vein thrombosis in the upper extremity.  *See table(s) above for measurements and observations.  Diagnosing physician: Heath Lark Electronically signed by Heath Lark on 05/31/2022 at 4:32:14 PM.     Final    ECHOCARDIOGRAM LIMITED  Result Date: 05/30/2022    ECHOCARDIOGRAM REPORT   Patient Name:   CHUKWUKA FESTA [facility] Date of Exam: 05/30/2022 Medical Rec #:  [id number]    Height:       67.0 in Accession #:    [id number]   Weight:       146.8 lb Date of Birth:  08-29-1960   BSA:          1.773 m Patient Age:    61 years     BP:           123/83 mmHg Patient Gender: M            HR:           88 bpm. Exam Location:  Inpatient Procedure: Limited Echo, Color Doppler and Cardiac Doppler Indications:    Bacteremia  History:        Patient has prior history of Echocardiogram examinations, most                 recent 03/25/2022. Risk Factors:Hypertension.  Sonographer:    Delcie Roch RDCS Referring Phys: [id number] [facility] IMPRESSIONS  1. Left ventricular ejection fraction, by estimation, is 60 to 65%. The left ventricle has normal function. The left ventricle has no regional wall motion abnormalities. Left ventricular diastolic parameters are consistent with Grade I diastolic dysfunction (impaired relaxation).  2. Right ventricular systolic function is normal. The right ventricular size is normal.  3. The mitral valve is normal in structure. No evidence of mitral valve regurgitation. No evidence of mitral stenosis.  4. The aortic valve is tricuspid. Aortic valve regurgitation is not visualized. No aortic stenosis is present.  5. The inferior vena cava is normal in size with greater than 50% respiratory variability, suggesting right atrial pressure of 3 mmHg. FINDINGS  Left Ventricle: Left ventricular ejection fraction, by estimation, is 60 to 65%. The left ventricle has normal function. The left ventricle has no regional wall motion abnormalities. The left ventricular internal cavity size was normal in size. There is  no left ventricular hypertrophy. Left ventricular diastolic parameters are consistent with Grade I diastolic dysfunction (impaired relaxation). Right Ventricle: The right ventricular size is  normal. No increase in right ventricular wall thickness. Right ventricular systolic function is normal. Left Atrium: Left atrial size was normal in size. Right Atrium: Right atrial size was normal in size. Pericardium: There is no evidence of pericardial effusion. Mitral Valve: The mitral valve is normal in structure. No evidence of mitral valve regurgitation. No evidence of mitral valve stenosis. Tricuspid Valve: The tricuspid valve is normal in structure. Tricuspid valve regurgitation is not demonstrated. No evidence of tricuspid stenosis. Aortic Valve: The aortic valve is tricuspid.  Aortic valve regurgitation is not visualized. No aortic stenosis is present. Pulmonic Valve: The pulmonic valve was normal in structure. Pulmonic valve regurgitation is trivial. No evidence of pulmonic stenosis. Aorta: The aortic root is normal in size and structure. Venous: The inferior vena cava is normal in size with greater than 50% respiratory variability, suggesting right atrial pressure of 3 mmHg. IAS/Shunts: No atrial level shunt detected by color flow Doppler.  IVC IVC diam: 1.20 cm AORTIC VALVE LVOT Vmax:   87.30 cm/s LVOT Vmean:  56.800 cm/s LVOT VTI:    0.145 m MITRAL VALVE MV Area (PHT): 3.99 cm    SHUNTS MV Decel Time: 190 msec    Systemic VTI: 0.14 m MV E velocity: 48.70 cm/s MV A velocity: 64.30 cm/s MV E/A ratio:  0.76 Chilton Si MD Electronically signed by Chilton Si MD Signature Date/Time: 05/30/2022/6:13:16 PM    Final       Scheduled Meds:  enoxaparin (LOVENOX) injection  40 mg Subcutaneous Q24H   folic acid  1 mg Oral Daily   lacosamide  100 mg Oral BID   levETIRAcetam  1,500 mg Oral BID   metoprolol tartrate  25 mg Oral BID   multivitamin with minerals  1 tablet Oral Daily   thiamine  100 mg Oral Daily   Or   thiamine  100 mg Intravenous Daily   Continuous Infusions:   ceFAZolin (ANCEF) IV 2 g (06/01/22 0129)      Azucena Fallen, MD [facility] 06/01/2022, 7:50 AM

## 2022-06-01 NOTE — TOC Initial Note (Signed)
Transition of Care [facility]) - Initial/Assessment Note    Patient Details  Name: Eric Conley MRN: [id number] Date of Birth: 02-05-1961  [facility] CM/SW Contact:    Pollie Friar, RN Phone Number: 06/01/2022, 2:42 PM  Clinical Narrative:                 Recommendations are for [facility] services, CM met with the patient and he states he lives with his brother and SIL. He asked that they be called to make that decision. CM has left voicemail for SIL: Cheryl.  TOC following.  Expected Discharge Plan: [place] Barriers to Discharge: Continued Medical Work up   Patient Goals and CMS Choice   CMS Medicare.gov Compare Post Acute Care list provided to:: Patient Represenative (must comment) Choice offered to / list presented to :  (SIL)      Expected Discharge Plan and Services   Discharge Planning Services: CM Consult Post Acute Care Choice: [place] arrangements for the past 2 months: Single Family Home                           HH Arranged: PT, OT          Prior Living Arrangements/Services Living arrangements for the past 2 months: Single Family Home   Patient language and need for interpreter reviewed:: Yes Do you feel safe going back to the place where you live?: Yes            Criminal Activity/Legal Involvement Pertinent to Current Situation/Hospitalization: No - Comment as needed  Activities of Daily Living Home Assistive Devices/Equipment: [place] Rile (specify type) ADL Screening (condition at time of admission) Patient's cognitive ability adequate to safely complete daily activities?: Yes Is the patient deaf or have difficulty hearing?: No Does the patient have difficulty seeing, even when wearing glasses/contacts?: No Does the patient have difficulty concentrating, remembering, or making decisions?: No Patient able to express need for assistance with ADLs?: Yes Does the patient have difficulty dressing or bathing?:  No Independently performs ADLs?: Yes (appropriate for developmental age) Does the patient have difficulty walking or climbing stairs?: No Weakness of Legs: None Weakness of Arms/Hands: None  Permission Sought/Granted                  Emotional Assessment Appearance:: Appears stated age Attitude/Demeanor/Rapport: Engaged Affect (typically observed): Accepting Orientation: : Oriented to Self, Oriented to Place, Oriented to  Time, Oriented to Situation   Psych Involvement: No (comment)  Admission diagnosis:  Seizure ([place]) [R56.9] Seizures ([employer]) [R56.9] Patient Active Problem List   Diagnosis Date Noted   Seizures ([place]) 05/26/2022   Alcohol abuse 05/26/2022   Superficial thrombophlebitis of right forearm 03/31/2022   Cellulitis of arm 03/27/2022   SVT (supraventricular tachycardia) 03/25/2022   Hyperkalemia 03/25/2022   Fever 03/25/2022   Delirium tremens ([place]) 03/23/2022   Alcohol withdrawal seizure with complication ([place])    Seizure ([place]) 03/20/2022   Hypokalemia 03/20/2022   Hypomagnesemia 03/20/2022   Hypocalcemia 03/20/2022   GERD (gastroesophageal reflux disease) 03/20/2022   Tobacco use disorder 03/20/2022   Wrist pain 03/20/2022   Epidural hematoma ([place]) 01/24/2022   SDH (subdural hematoma) ([place]) 01/24/2022   Severe sepsis ([place]) 12/30/2021   Hyponatremia 01/25/2021   ARF (acute renal failure) ([place]) 01/25/2021    Class: Acute   Diarrhea 01/25/2021    Class: Acute   Dehydration 01/25/2021   Hypotensive episode 01/25/2021  Class: Acute   Essential hypertension 01/25/2021   Severe recurrent major depression without psychotic features ([place]) 01/11/2021   Suicidal thoughts    PCP:  Sharilyn Sites, MD Pharmacy:   [facility] [place] [address] Phone: [phone] Fax: [phone]     Social Determinants of Health (SDOH) Social History: [place]: No Food Insecurity  (05/31/2022)  Housing: Low Risk  (05/31/2022)  Transportation Needs: No Transportation Needs (05/31/2022)  Utilities: Not At Risk (05/31/2022)  Tobacco Use: High Risk (05/31/2022)   SDOH Interventions:     Readmission Risk Interventions     No data to display

## 2022-06-01 NOTE — Progress Notes (Signed)
Mobility Specialist: Progress Note   06/01/22 1732  Mobility  Activity Ambulated with assistance in hallway  Level of Assistance Contact guard assist, steadying assist  Assistive Device Front wheel walker  Distance Ambulated (ft) 300 ft  Activity Response Tolerated well  Mobility Referral Yes  $Mobility charge 1 Mobility   Received pt in bed having no complaints and agreeable to mobility. Pt was asymptomatic throughout ambulation and returned to room w/o fault. Left in bed w/ call bell in reach and all needs met.  [place] Mobility Specialist Please contact via SecureChat or Rehab office at [phone]

## 2022-06-02 ENCOUNTER — Telehealth (HOSPITAL_COMMUNITY): Payer: Self-pay | Admitting: Pharmacy Technician

## 2022-06-02 ENCOUNTER — Other Ambulatory Visit (HOSPITAL_COMMUNITY): Payer: Self-pay

## 2022-06-02 DIAGNOSIS — R569 Unspecified convulsions: Secondary | ICD-10-CM | POA: Diagnosis not present

## 2022-06-02 MED ORDER — LACOSAMIDE 100 MG PO TABS
100.0000 mg | ORAL_TABLET | Freq: Two times a day (BID) | ORAL | 0 refills | Status: DC
  Filled 2022-06-02: qty 60, 30d supply, fill #0

## 2022-06-02 MED ORDER — ORITAVANCIN DIPHOSPHATE 400 MG IV SOLR
1200.0000 mg | Freq: Once | INTRAVENOUS | Status: AC
  Administered 2022-06-02: 1200 mg via INTRAVENOUS
  Filled 2022-06-02: qty 120

## 2022-06-02 MED ORDER — LINEZOLID 600 MG PO TABS
600.0000 mg | ORAL_TABLET | Freq: Two times a day (BID) | ORAL | 0 refills | Status: AC
  Filled 2022-06-02: qty 28, 14d supply, fill #0

## 2022-06-02 MED ORDER — METOPROLOL TARTRATE 25 MG PO TABS
25.0000 mg | ORAL_TABLET | Freq: Two times a day (BID) | ORAL | 0 refills | Status: DC
  Filled 2022-06-02: qty 60, 30d supply, fill #0

## 2022-06-02 MED ORDER — LEVETIRACETAM 750 MG PO TABS
1500.0000 mg | ORAL_TABLET | Freq: Two times a day (BID) | ORAL | 1 refills | Status: DC
  Filled 2022-06-02: qty 120, 30d supply, fill #0

## 2022-06-02 NOTE — Telephone Encounter (Signed)
Patient Advocate Encounter  Prior Authorization for Lacosamide 100MG  tablets has been approved.    PA# Effective dates: 06/02/2022 through 06/03/2023      06/05/2023, CPhT Pharmacy Patient Advocate Specialist [facility] Patient Advocate Team Direct Number: [phone]  Fax: [phone]

## 2022-06-02 NOTE — Progress Notes (Signed)
Occupational Therapy Treatment Patient Details Name: Eric Conley MRN: [id number] DOB: 10/02/1960 Today's Date: 06/02/2022   History of present illness Patient is a 61 y/o male who presents on 12/20 with seizure activity accompanied by UI. Also went into alcohol withdrawal and developed fever of unclear etiology. PMH includes depression, HTN, alcohol dependence, SDH in August 2023.   OT comments  Pt progressing towards established OT goals with greater memory this session, but continues with difficulty problem solving. Pt performing 4 step path finding with mod A for problem solving and use of compensatory techniques to locate each item in path. Pt performing in incorrect order as well despite max cues to perform in order OT requested. Continue to highly recommend HHOT to optimize safety and independence with ADL and IADL. Pt will benefit from supervision in the home setting during medication and financial management.    Recommendations for follow up therapy are one component of a multi-disciplinary discharge planning process, led by the attending physician.  Recommendations may be updated based on patient status, additional functional criteria and insurance authorization.    Follow Up Recommendations  Home health OT     Assistance Recommended at Discharge Intermittent Supervision/Assistance  Patient can return home with the following  A little help with walking and/or transfers;A little help with bathing/dressing/bathroom;Assistance with cooking/housework;Direct supervision/assist for medications management;Direct supervision/assist for financial management;Assist for transportation;Help with stairs or ramp for entrance   Equipment Recommendations  None recommended by OT    Recommendations for Other Services      Precautions / Restrictions Precautions Precautions: Fall Restrictions Weight Bearing Restrictions: No       Mobility Bed Mobility Overal bed mobility: Needs  Assistance Bed Mobility: Supine to Sit, Sit to Supine     Supine to sit: Supervision, HOB elevated     General bed mobility comments: No assist needed, increased time.    Transfers Overall transfer level: Needs assistance Equipment used: None Transfers: Sit to/from Stand Sit to Stand: Min guard           General transfer comment: Min guard A for safety; Pt holding onto IV pole throughout gait     Balance Overall balance assessment: Needs assistance Sitting-balance support: Feet supported, No upper extremity supported Sitting balance-Leahy Scale: Fair     Standing balance support: During functional activity, Single extremity supported Standing balance-Leahy Scale: Fair Standing balance comment: min guard A- close supervision                           ADL either performed or assessed with clinical judgement   ADL Overall ADL's : Needs assistance/impaired                         Toilet Transfer: Min guard;Ambulation Toilet Transfer Details (indicate cue type and reason): Min guard A for safety         Functional mobility during ADLs: Min guard General ADL Comments: Pt refusing any ADLs. Focus session on cognition to perform 4 step path finding task. Pt requiring up to max cues despite education regarding use of signage in hall to locate room numbers. Pt able to recall all steps of path, but unable to recall order    Extremity/Trunk Assessment Upper Extremity Assessment Upper Extremity Assessment: Generalized weakness   Lower Extremity Assessment Lower Extremity Assessment: Generalized weakness        Vision   Vision Assessment?: No apparent visual deficits  Perception     Praxis      Cognition Arousal/Alertness: Awake/alert Behavior During Therapy: Flat affect Overall Cognitive Status: Impaired/Different from baseline Area of Impairment: Memory, Safety/judgement, Awareness, Problem solving                     Memory:  Decreased short-term memory   Safety/Judgement: Decreased awareness of deficits, Decreased awareness of safety Awareness: Emergent Problem Solving: Slow processing, Requires verbal cues General Comments: Pt continues with poor insight into deficits, reporting " I would be able to do everything just fine at home despite difficulty answering safety questions and difficulty problem solving. Pt performing 4 step path finding in incorrect order despite max cues to perform in order stated. Pt required mod cues to locate three different rooms and a computer. Pt with poor insight into deficits, reporting "I don't know where I am going anyway" even after orientation to compensatory strategies such as signage        Exercises      Shoulder Instructions       General Comments Pt benefitting from encouragement to participate. With max excuses regarding cognitive deficits this session "well, I am not wearing underwear", "well I have been to the hospital a lot but I dont know where I am at", etc    Pertinent Vitals/ Pain       Pain Assessment Pain Assessment: No/denies pain Faces Pain Scale: No hurt Pain Intervention(s): Limited activity within patient's tolerance, Monitored during session  Home Living                                          Prior Functioning/Environment              Frequency  Min 2X/week        Progress Toward Goals  OT Goals(current goals can now be found in the care plan section)  Progress towards OT goals: Progressing toward goals  Acute Rehab OT Goals Patient Stated Goal: get out of hospital OT Goal Formulation: With patient Time For Goal Achievement: 06/12/22 Potential to Achieve Goals: Good ADL Goals Pt Will Perform Lower Body Dressing: Independently Pt Will Transfer to Toilet: Independently Pt Will Perform Tub/Shower Transfer: Shower transfer;with modified independence;ambulating Additional ADL Goal #1: Pt will perform 4 step path  finding task with no verbal cues and stable HR.  Plan Discharge plan remains appropriate;Frequency remains appropriate    Co-evaluation                 AM-PAC OT "6 Clicks" Daily Activity     Outcome Measure   Help from another person eating meals?: None Help from another person taking care of personal grooming?: A Little Help from another person toileting, which includes using toliet, bedpan, or urinal?: A Little Help from another person bathing (including washing, rinsing, drying)?: A Little Help from another person to put on and taking off regular upper body clothing?: A Little Help from another person to put on and taking off regular lower body clothing?: A Little 6 Click Score: 19    End of Session Equipment Utilized During Treatment: Gait belt  OT Visit Diagnosis: Unsteadiness on feet (R26.81);Muscle weakness (generalized) (M62.81);Other symptoms and signs involving cognitive function   Activity Tolerance Patient tolerated treatment well   Patient Left in bed;with call bell/phone within reach;with nursing/sitter in room   Nurse Communication Mobility status  Time: 7893-8101 OT Time Calculation (min): 19 min  Charges: OT General Charges $OT Visit: 1 Visit OT Treatments $Therapeutic Activity: 8-22 mins  Tyler Deis, OTR/L [facility] Acute Rehabilitation Office: [phone]   Myrla Halsted 06/02/2022, 1:03 PM

## 2022-06-02 NOTE — TOC Benefit Eligibility Note (Signed)
Patient Product/process development scientist completed.    The patient is currently admitted and upon discharge could be taking linezolid (Zyvox) 600 mg.  The current 30 day co-pay is $0.00.   The patient is insured through [employer]  Roland Earl, CPHT Pharmacy Patient Advocate Specialist [facility] Patient Advocate Team Direct Number: [phone]  Fax: [phone]

## 2022-06-02 NOTE — Discharge Summary (Signed)
Physician Discharge Summary  DARIK MASSING ZOX:[id number] DOB: [birth year] DOA: 05/25/2022  PCP: Assunta Found, MD  Admit date: 05/25/2022 Discharge date: 06/02/2022  Admitted From: Home Disposition: Home  Recommendations for Outpatient Follow-up:  Follow up with PCP in 1-2 weeks Follow-up with infectious disease as scheduled  Home Health: PT OT Equipment/Devices: No new equipment  Discharge Condition: Stable CODE STATUS: Full Diet recommendation: Low-salt low-fat low-carb diet  Brief/Interim Summary: 61 year old male with history of alcohol abuse in the past, seizure disorder, hypertension, subdural hematoma in August 2023 presented with seizures. Neurology was consulted. He was started on antiseizure medications and LTM EEG was ordered. Patient subsequently went into alcohol withdrawal. Subsequent the patient developed fever. Blood cultures growing Staph aureus.   Discharge Diagnoses:  Principal Problem:   Seizures (HCC) Active Problems:   Alcohol abuse   Essential hypertension   Seizure (HCC)  Breakthrough seizures/seizure disorder Patient was seen by neurology.   Continue on Keppra and Vimpat.   LTM has been discontinued - no further obvious seizure activity noted.     Sepsis secondary to aureus bacteremia, likely POA Unclear source, continue treatment per infectious disease with linezolid through 06/09/2022   Superficial vein thrombosis of R cephalic vein and medial cubital vein Noted on doppler studies  Continue hot compresses and supportive care Left AC ultrasound unremarkable   Acute encephalopathy, resolved Likely multifactorial including seizures as well as alcohol withdrawal and infection.   Back to baseline   Alcohol withdrawal/history of alcohol abuse  Alcohol level less than 10 on presentation Outside window for withdrawals at this point.   Mild transaminitis Possibly from infection, history of alcoholism.  Hepatitis panel is negative.      Hypokalemia/hypomagnesemia/hyponatremia Repeat labs with PCP next week   Normocytic anemia Likely anemia of chronic disease, follow outpatient labs with PCP next week   Acute metabolic acidosis Secondary to above, resolved   Essential hypertension/sinus tachycardia Patient was on diltiazem and the metoprolol was added for her sinus tachycardia due to concern for rebound tachycardia as he was on metoprolol prior to admission. Diltiazem discontinued  Discharge Instructions  Discharge Instructions     Face-to-face encounter (required for Medicare/Medicaid patients)   Complete by: As directed    I Azucena Fallen certify that this patient is under my care and that I, or a nurse practitioner or physician's assistant working with me, had a face-to-face encounter that meets the physician face-to-face encounter requirements with this patient on 06/02/2022. The encounter with the patient was in whole, or in part for the following medical condition(s) which is the primary reason for home health care (List medical condition): Ambulatory dysfunction   The encounter with the patient was in whole, or in part, for the following medical condition, which is the primary reason for home health care: Ambulatory dysfunction   I certify that, based on my findings, the following services are medically necessary home health services: Physical therapy   Reason for Medically Necessary Home Health Services:  Skilled Nursing- Change/Decline in Patient Status Therapy- Investment banker, operational, Patent examiner Therapy- Instruction on use of Assistive Device for Ambulation on all Surfaces Therapy- Home Adaptation to Facilitate Safety     My clinical findings support the need for the above services: Unable to leave home safely without assistance and/or assistive device   Further, I certify that my clinical findings support that this patient is homebound due to: Unable to leave home safely without  assistance   Home Health  Complete by: As directed    To provide the following care/treatments:  PT OT        Allergies as of 06/02/2022       Reactions   Codeine    Poison Sumac Extract    Vancomycin    Possible redman rxn - order at 1/2 infusion rate         Medication List     STOP taking these medications    calcium carbonate 500 MG chewable tablet Commonly known as: TUMS - dosed in mg elemental calcium   diltiazem 120 MG 24 hr capsule Commonly known as: CARDIZEM CD       TAKE these medications    folic acid 1 MG tablet Commonly known as: FOLVITE Take 1 tablet (1 mg total) by mouth daily.   Lacosamide 100 MG Tabs Take 1 tablet (100 mg total) by mouth 2 (two) times daily.   levETIRAcetam 750 MG tablet Commonly known as: KEPPRA Take 2 tablets (1,500 mg total) by mouth 2 (two) times daily. What changed:  medication strength how much to take   linezolid 600 MG tablet Commonly known as: ZYVOX Take 1 tablet (600 mg total) by mouth 2 (two) times daily for 14 days. Start taking on Thursday, 06/09/22 - then complete the full 2 weeks thru 06/22/22 Start taking on: June 09, 2022   magnesium oxide 400 (240 Mg) MG tablet Commonly known as: MAG-OX Take 1 tablet (400 mg total) by mouth 2 (two) times daily.   metoprolol tartrate 25 MG tablet Commonly known as: LOPRESSOR Take 1 tablet (25 mg total) by mouth 2 (two) times daily. What changed:  medication strength how much to take   omeprazole 20 MG capsule Commonly known as: PRILOSEC Take 1 capsule (20 mg total) by mouth daily. Alternates with nexium   thiamine 100 MG tablet Commonly known as: Vitamin B-1 Take 1 tablet (100 mg total) by mouth daily.        Follow-up Information     [facility] Follow up.   Specialty: Home Health Services Why: The home health agency will contact you for the first home visit. Contact information: [address] [place]  [phone]         Odette Fraction, MD Follow up on 06/22/2022.   Specialty: Infectious Diseases Why: Hospital Discharge Follow Up 3:00 pm appointment - please arrive 15 minutes early to check in Contact information: [address] [employer] [address] [place] [address] [phone]                Allergies  Allergen Reactions   Codeine    Poison Sumac Extract    Vancomycin     Possible redman rxn - order at 1/2 infusion rate     Consultations: Infectious disease, neuro  Procedures/Studies: VAS [place] UPPER EXTREMITY VENOUS DUPLEX  Result Date: 05/31/2022 UPPER VENOUS STUDY  Patient Name:  LAWTON DOLLINGER [facility]  Date of Exam:   05/31/2022 Medical Rec #: [id number]     Accession #:    [id number] Date of Birth: [birth year]    Patient Gender: M Patient Age:   45 years Exam Location:  [facility] Procedure:      VAS [place] UPPER EXTREMITY VENOUS DUPLEX Referring Phys: Osvaldo Shipper --------------------------------------------------------------------------------  Indications: Pain, swelling RUE Limitations: Patient movement. Comparison Study: Previous RUEV on 03/30/22 was positive for SVT in cephalic  vein Performing Technologist: Ernestene Mention RVT, RDMS  Examination Guidelines: A complete evaluation includes B-mode imaging, spectral Doppler, color Doppler, and power Doppler as needed of all accessible portions of each vessel. Bilateral testing is considered an integral part of a complete examination. Limited examinations for reoccurring indications may be performed as noted.  Right Findings: +----------+------------+---------+-----------+----------+-----------------+ RIGHT     CompressiblePhasicitySpontaneousProperties     Summary      +----------+------------+---------+-----------+----------+-----------------+ IJV           Full       Yes       Yes                                +----------+------------+---------+-----------+----------+-----------------+  Subclavian    Full       Yes       Yes                                +----------+------------+---------+-----------+----------+-----------------+ Axillary      Full       Yes       Yes                                +----------+------------+---------+-----------+----------+-----------------+ Brachial      Full       Yes       Yes                                +----------+------------+---------+-----------+----------+-----------------+ Radial        Full                                                    +----------+------------+---------+-----------+----------+-----------------+ Ulnar         Full                                                    +----------+------------+---------+-----------+----------+-----------------+ Cephalic      None       No        No               Age Indeterminate +----------+------------+---------+-----------+----------+-----------------+ Basilic       Full       Yes       Yes                                +----------+------------+---------+-----------+----------+-----------------+  Left Findings: +----------+------------+---------+-----------+----------+-------+ LEFT      CompressiblePhasicitySpontaneousPropertiesSummary +----------+------------+---------+-----------+----------+-------+ IJV           Full       No        Yes                      +----------+------------+---------+-----------+----------+-------+ Subclavian    Full                                          +----------+------------+---------+-----------+----------+-------+  Axillary      Full       Yes       Yes                      +----------+------------+---------+-----------+----------+-------+ Brachial      Full       Yes       Yes                      +----------+------------+---------+-----------+----------+-------+ Radial        Full       Yes       Yes                       +----------+------------+---------+-----------+----------+-------+ Ulnar         Full                                          +----------+------------+---------+-----------+----------+-------+ Cephalic      Full       Yes       Yes                      +----------+------------+---------+-----------+----------+-------+ Basilic       Full                                          +----------+------------+---------+-----------+----------+-------+  Summary:  Right: No evidence of deep vein thrombosis in the upper extremity. Findings consistent with age indeterminate superficial vein thrombosis involving the right cephalic vein and median cubital vein.  Left: No evidence of deep vein thrombosis in the upper extremity. No evidence of superficial vein thrombosis in the upper extremity.  *See table(s) above for measurements and observations.  Diagnosing physician: Heath Lark Electronically signed by Heath Lark on 05/31/2022 at 4:32:14 PM.    Final    ECHOCARDIOGRAM LIMITED  Result Date: 05/30/2022    ECHOCARDIOGRAM REPORT   Patient Name:   ALGIS LEHENBAUER [facility] Date of Exam: 05/30/2022 Medical Rec #:  [id number]    Height:       67.0 in Accession #:    [id number]   Weight:       146.8 lb Date of Birth:  04-12-1961   BSA:          1.773 m Patient Age:    61 years     BP:           123/83 mmHg Patient Gender: M            HR:           88 bpm. Exam Location:  Inpatient Procedure: Limited Echo, Color Doppler and Cardiac Doppler Indications:    Bacteremia  History:        Patient has prior history of Echocardiogram examinations, most                 recent 03/25/2022. Risk Factors:Hypertension.  Sonographer:    Delcie Roch RDCS Referring Phys: [id number] [facility] IMPRESSIONS  1. Left ventricular ejection fraction, by estimation, is 60 to 65%. The left ventricle has normal function. The left ventricle has no regional wall motion abnormalities. Left ventricular diastolic parameters are consistent  with Grade I diastolic dysfunction (impaired relaxation).  2. Right  ventricular systolic function is normal. The right ventricular size is normal.  3. The mitral valve is normal in structure. No evidence of mitral valve regurgitation. No evidence of mitral stenosis.  4. The aortic valve is tricuspid. Aortic valve regurgitation is not visualized. No aortic stenosis is present.  5. The inferior vena cava is normal in size with greater than 50% respiratory variability, suggesting right atrial pressure of 3 mmHg. FINDINGS  Left Ventricle: Left ventricular ejection fraction, by estimation, is 60 to 65%. The left ventricle has normal function. The left ventricle has no regional wall motion abnormalities. The left ventricular internal cavity size was normal in size. There is  no left ventricular hypertrophy. Left ventricular diastolic parameters are consistent with Grade I diastolic dysfunction (impaired relaxation). Right Ventricle: The right ventricular size is normal. No increase in right ventricular wall thickness. Right ventricular systolic function is normal. Left Atrium: Left atrial size was normal in size. Right Atrium: Right atrial size was normal in size. Pericardium: There is no evidence of pericardial effusion. Mitral Valve: The mitral valve is normal in structure. No evidence of mitral valve regurgitation. No evidence of mitral valve stenosis. Tricuspid Valve: The tricuspid valve is normal in structure. Tricuspid valve regurgitation is not demonstrated. No evidence of tricuspid stenosis. Aortic Valve: The aortic valve is tricuspid. Aortic valve regurgitation is not visualized. No aortic stenosis is present. Pulmonic Valve: The pulmonic valve was normal in structure. Pulmonic valve regurgitation is trivial. No evidence of pulmonic stenosis. Aorta: The aortic root is normal in size and structure. Venous: The inferior vena cava is normal in size with greater than 50% respiratory variability, suggesting right  atrial pressure of 3 mmHg. IAS/Shunts: No atrial level shunt detected by color flow Doppler.  IVC IVC diam: 1.20 cm AORTIC VALVE LVOT Vmax:   87.30 cm/s LVOT Vmean:  56.800 cm/s LVOT VTI:    0.145 m MITRAL VALVE MV Area (PHT): 3.99 cm    SHUNTS MV Decel Time: 190 msec    Systemic VTI: 0.14 m MV E velocity: 48.70 cm/s MV A velocity: 64.30 cm/s MV E/A ratio:  0.76 Chilton Si MD Electronically signed by Chilton Si MD Signature Date/Time: 05/30/2022/6:13:16 PM    Final    DG Chest Port 1 View  Result Date: 05/28/2022 CLINICAL DATA:  Sepsis EXAM: PORTABLE CHEST 1 VIEW COMPARISON:  Radiographs 03/24/2022 FINDINGS: Stable cardiomediastinal silhouette. No focal consolidation, pleural effusion, or pneumothorax. No acute osseous abnormality. IMPRESSION: No active disease. Electronically Signed   By: Minerva Fester M.D.   On: 05/28/2022 02:43   Overnight EEG with video  Result Date: 05/26/2022 Charlsie Quest, MD     05/27/2022 12:00 PM Patient Name: BURNETT SPRAY MRN: [id number] Epilepsy Attending: Charlsie Quest Referring Physician/Provider: Rejeana Brock, MD Duration: 05/26/2022 0124 to 05/27/2022 0124 Patient history: 61 year old male with a history of seizures who presents with seizure flurry of unclear etiology.  EEG to evaluate for seizure. Level of alertness: Awake, asleep AEDs during EEG study: LEV, LCM, VPA Technical aspects: This EEG study was done with scalp electrodes positioned according to the 10-20 International system of electrode placement. Electrical activity was reviewed with band pass filter of 1-70Hz , sensitivity of 7 uV/mm, display speed of 71mm/sec with a 60Hz  notched filter applied as appropriate. EEG data were recorded continuously and digitally stored.  Video monitoring was available and reviewed as appropriate. Description: The posterior dominant rhythm consists of 9-10 Hz activity of moderate voltage (25-35 uV) seen predominantly in posterior  head regions,  symmetric and reactive to eye opening and eye closing. Sleep was characterized by vertex waves, sleep spindles (12 to 14 Hz), maximal frontocentral region. EEG also showed continuous generalized 3-6hz  theta-delta slowing with overriding 15-18hz  beta activity. Six seizures without clinical signs were noted on 05/27/2022 at 0502, 0753, 0919, 1036, 1315, 1547. During seizure, EEG showed generalized maximal bifrontal sharply contoured 5 to 7 Hz theta slowing admixed with 2 to 3 Hz delta slowing with evolution in morphology and frequency. Average duration of seizures was around 30 minutes. Hyperventilation and photic stimulation were not performed.   ABNORMALITY - Seizure without clinical signs, generalized - Continuous slow, generalized IMPRESSION: This study showed six seizures without clinical signs with generalized onset on 05/27/2022 at 0502, 0753, 0919, 1036, 1315 and 1547 lasting about 30 minutes each. As medications were adjusted, EEG improved and was suggestive of moderate diffuse encephalopathy. Priyanka Annabelle Harman   CT HEAD WO CONTRAST  Result Date: 05/25/2022 CLINICAL DATA:  Head trauma, abnormal mental status.  Seizures. EXAM: CT HEAD WITHOUT CONTRAST CT CERVICAL SPINE WITHOUT CONTRAST TECHNIQUE: Multidetector CT imaging of the head and cervical spine was performed following the standard protocol without intravenous contrast. Multiplanar CT image reconstructions of the cervical spine were also generated. RADIATION DOSE REDUCTION: This exam was performed according to the departmental dose-optimization program which includes automated exposure control, adjustment of the mA and/or kV according to patient size and/or use of iterative reconstruction technique. COMPARISON:  03/24/2022. FINDINGS: CT HEAD FINDINGS Brain: No acute intracranial hemorrhage, midline shift or mass effect. No extra-axial fluid collection. The previously described subdural hematoma is no longer seen. Periventricular white matter  hypodensities are present bilaterally. Encephalomalacia is noted in the frontal and temporal lobes on the left, unchanged. No hydrocephalus. Mild atrophy is noted. Vascular: No hyperdense vessel or unexpected calcification. Skull: Normal. Negative for fracture or focal lesion. Sinuses/Orbits: There is partial opacification of the maxillary sinuses and ethmoid air cells. No acute orbital abnormality. Other: None. CT CERVICAL SPINE FINDINGS Alignment: Mild retrolisthesis at C5-C6. Skull base and vertebrae: No acute fracture. No primary bone lesion or focal pathologic process. Soft tissues and spinal canal: No prevertebral fluid or swelling. No visible canal hematoma. Disc levels: Multilevel intervertebral disc space narrowing, uncovertebral osteophyte formation, and mild facet arthropathy. Upper chest: Emphysematous changes are noted at the lung apices. Other: Carotid artery calcifications. IMPRESSION: 1. No acute intracranial hemorrhage. The previously described subdural hematoma is no longer seen. 2. Mild atrophy with chronic microvascular ischemic changes. 3. Stable encephalomalacia in the frontal and temporal lobes on the left. 4. Multilevel degenerative changes in the cervical spine without evidence of acute fracture. 5. Emphysema. Electronically Signed   By: Thornell Sartorius M.D.   On: 05/25/2022 21:57   CT CERVICAL SPINE WO CONTRAST  Result Date: 05/25/2022 CLINICAL DATA:  Head trauma, abnormal mental status.  Seizures. EXAM: CT HEAD WITHOUT CONTRAST CT CERVICAL SPINE WITHOUT CONTRAST TECHNIQUE: Multidetector CT imaging of the head and cervical spine was performed following the standard protocol without intravenous contrast. Multiplanar CT image reconstructions of the cervical spine were also generated. RADIATION DOSE REDUCTION: This exam was performed according to the departmental dose-optimization program which includes automated exposure control, adjustment of the mA and/or kV according to patient size  and/or use of iterative reconstruction technique. COMPARISON:  03/24/2022. FINDINGS: CT HEAD FINDINGS Brain: No acute intracranial hemorrhage, midline shift or mass effect. No extra-axial fluid collection. The previously described subdural hematoma is no longer seen. Periventricular white matter  hypodensities are present bilaterally. Encephalomalacia is noted in the frontal and temporal lobes on the left, unchanged. No hydrocephalus. Mild atrophy is noted. Vascular: No hyperdense vessel or unexpected calcification. Skull: Normal. Negative for fracture or focal lesion. Sinuses/Orbits: There is partial opacification of the maxillary sinuses and ethmoid air cells. No acute orbital abnormality. Other: None. CT CERVICAL SPINE FINDINGS Alignment: Mild retrolisthesis at C5-C6. Skull base and vertebrae: No acute fracture. No primary bone lesion or focal pathologic process. Soft tissues and spinal canal: No prevertebral fluid or swelling. No visible canal hematoma. Disc levels: Multilevel intervertebral disc space narrowing, uncovertebral osteophyte formation, and mild facet arthropathy. Upper chest: Emphysematous changes are noted at the lung apices. Other: Carotid artery calcifications. IMPRESSION: 1. No acute intracranial hemorrhage. The previously described subdural hematoma is no longer seen. 2. Mild atrophy with chronic microvascular ischemic changes. 3. Stable encephalomalacia in the frontal and temporal lobes on the left. 4. Multilevel degenerative changes in the cervical spine without evidence of acute fracture. 5. Emphysema. Electronically Signed   By: Thornell Sartorius M.D.   On: 05/25/2022 21:57     Subjective: No acute issues or events overnight   Discharge Exam: Vitals:   06/01/22 2312 06/02/22 0347  BP: 134/84 112/79  Pulse: 89 94  Resp: 16 16  Temp: 98.1 F (36.7 C) 98.4 F (36.9 C)  SpO2: 95% 97%   Vitals:   06/01/22 2002 06/01/22 2312 06/02/22 0347 06/02/22 0648  BP: 128/77 134/84 112/79    Pulse: 66 89 94   Resp: Temp: 98.2 F (36.8 C) 98.1 F (36.7 C) 98.4 F (36.9 C)   TempSrc: Oral Oral Oral   SpO2: 96% 95% 97%   Weight:    62.6 kg  Height:        General: Pt is alert, awake, not in acute distress Cardiovascular: RRR, S1/S2 +, no rubs, no gallops Respiratory: CTA bilaterally, no wheezing, no rhonchi Abdominal: Soft, NT, ND, bowel sounds + Extremities: no edema, no cyanosis    The results of significant diagnostics from this hospitalization (including imaging, microbiology, ancillary and laboratory) are listed below for reference.     Microbiology: Recent Results (from the past 240 hour(s))  Culture, blood (x 2)     Status: Abnormal   Collection Time: 05/28/22  2:29 AM   Specimen: BLOOD  Result Value Ref Range Status   Specimen Description BLOOD SITE NOT SPECIFIED  Final   Special Requests   Final    BOTTLES DRAWN AEROBIC AND ANAEROBIC Blood Culture results may not be optimal due to an excessive volume of blood received in culture bottles   Culture  Setup Time   Final    IN BOTH AEROBIC AND ANAEROBIC BOTTLES GRAM POSITIVE COCCI IN CHAINS IN CLUSTERS Organism ID to follow CRITICAL RESULT CALLED TO, READ BACK BY AND VERIFIED WITH:  C/ PHARMD K. HURTH 05/28/22 1646 A. LAFRANCE Performed at [facility], [address], [place], [place] [address]    Culture STAPHYLOCOCCUS AUREUS (A)  Final   Report Status 05/30/2022 FINAL  Final   Organism ID, Bacteria STAPHYLOCOCCUS AUREUS  Final      Susceptibility   Staphylococcus aureus - MIC*    CIPROFLOXACIN <=0.5 SENSITIVE Sensitive     ERYTHROMYCIN <=0.25 SENSITIVE Sensitive     GENTAMICIN <=0.5 SENSITIVE Sensitive     OXACILLIN 0.5 SENSITIVE Sensitive     TETRACYCLINE <=1 SENSITIVE Sensitive     VANCOMYCIN 1 SENSITIVE Sensitive  TRIMETH/SULFA <=10 SENSITIVE Sensitive     CLINDAMYCIN <=0.25 SENSITIVE Sensitive     RIFAMPIN <=0.5 SENSITIVE Sensitive     Inducible Clindamycin NEGATIVE  Sensitive     * STAPHYLOCOCCUS AUREUS  Blood Culture ID Panel (Reflexed)     Status: Abnormal   Collection Time: 05/28/22  2:29 AM  Result Value Ref Range Status   Enterococcus faecalis NOT DETECTED NOT DETECTED Final   Enterococcus Faecium NOT DETECTED NOT DETECTED Final   Listeria monocytogenes NOT DETECTED NOT DETECTED Final   Staphylococcus species DETECTED (A) NOT DETECTED Final    Comment: CRITICAL RESULT CALLED TO, READ BACK BY AND VERIFIED WITH:  C/ PHARMD K. HURTH 05/28/22 1646 A. LAFRANCE    Staphylococcus aureus (BCID) DETECTED (A) NOT DETECTED Final    Comment: CRITICAL RESULT CALLED TO, READ BACK BY AND VERIFIED WITH:  C/ PHARMD K. HURTH 05/28/22 1646 A. LAFRANCE    Staphylococcus epidermidis NOT DETECTED NOT DETECTED Final   Staphylococcus lugdunensis NOT DETECTED NOT DETECTED Final   Streptococcus species NOT DETECTED NOT DETECTED Final   Streptococcus agalactiae NOT DETECTED NOT DETECTED Final   Streptococcus pneumoniae NOT DETECTED NOT DETECTED Final   Streptococcus pyogenes NOT DETECTED NOT DETECTED Final   A.calcoaceticus-baumannii NOT DETECTED NOT DETECTED Final   Bacteroides fragilis NOT DETECTED NOT DETECTED Final   Enterobacterales NOT DETECTED NOT DETECTED Final   Enterobacter cloacae complex NOT DETECTED NOT DETECTED Final   Escherichia coli NOT DETECTED NOT DETECTED Final   Klebsiella aerogenes NOT DETECTED NOT DETECTED Final   Klebsiella oxytoca NOT DETECTED NOT DETECTED Final   Klebsiella pneumoniae NOT DETECTED NOT DETECTED Final   Proteus species NOT DETECTED NOT DETECTED Final   Salmonella species NOT DETECTED NOT DETECTED Final   Serratia marcescens NOT DETECTED NOT DETECTED Final   Haemophilus influenzae NOT DETECTED NOT DETECTED Final   Neisseria meningitidis NOT DETECTED NOT DETECTED Final   Pseudomonas aeruginosa NOT DETECTED NOT DETECTED Final   Stenotrophomonas maltophilia NOT DETECTED NOT DETECTED Final   Candida albicans NOT DETECTED NOT  DETECTED Final   Candida auris NOT DETECTED NOT DETECTED Final   Candida glabrata NOT DETECTED NOT DETECTED Final   Candida krusei NOT DETECTED NOT DETECTED Final   Candida parapsilosis NOT DETECTED NOT DETECTED Final   Candida tropicalis NOT DETECTED NOT DETECTED Final   Cryptococcus neoformans/gattii NOT DETECTED NOT DETECTED Final   Meth resistant mecA/C and MREJ NOT DETECTED NOT DETECTED Final    Comment: Performed at [facility], [address] [place], [place] [address]  Culture, blood (x 2)     Status: Abnormal   Collection Time: 05/28/22  2:31 AM   Specimen: BLOOD RIGHT ARM  Result Value Ref Range Status   Specimen Description BLOOD RIGHT ARM  Final   Special Requests   Final    BOTTLES DRAWN AEROBIC AND ANAEROBIC Blood Culture results may not be optimal due to an excessive volume of blood received in culture bottles   Culture  Setup Time   Final    IN BOTH AEROBIC AND ANAEROBIC BOTTLES GRAM POSITIVE COCCI IN CHAINS CRITICAL VALUE NOTED.  VALUE IS CONSISTENT WITH PREVIOUSLY REPORTED AND CALLED VALUE.    Culture (A)  Final    STAPHYLOCOCCUS AUREUS SUSCEPTIBILITIES PERFORMED ON PREVIOUS CULTURE WITHIN THE LAST 5 DAYS. Performed at [facility], [address] [place], [place] [address]    Report Status 05/30/2022 FINAL  Final  Culture, blood (Routine X 2) w Reflex to ID  Panel     Status: None (Preliminary result)   Collection Time: 05/30/22 11:05 AM   Specimen: BLOOD  Result Value Ref Range Status   Specimen Description BLOOD LEFT ANTECUBITAL  Final   Special Requests   Final    BOTTLES DRAWN AEROBIC AND ANAEROBIC Blood Culture adequate volume   Culture   Final    NO GROWTH 3 DAYS Performed at [facility], [address], [place], [place] [address]    Report Status PENDING  Incomplete  Culture, blood (Routine X 2) w Reflex to ID Panel     Status: None (Preliminary result)   Collection Time: 05/30/22 11:05 AM   Specimen: BLOOD LEFT HAND  Result  Value Ref Range Status   Specimen Description BLOOD LEFT HAND  Final   Special Requests   Final    BOTTLES DRAWN AEROBIC AND ANAEROBIC Blood Culture adequate volume   Culture   Final    NO GROWTH 3 DAYS Performed at [facility], [address], [place], [place] [address]    Report Status PENDING  Incomplete     Labs: BNP (last 3 results) No results for input(s): "BNP" in the last 8760 hours. Basic Metabolic Panel: Recent Labs  Lab 05/27/22 0340 05/28/22 0231 05/29/22 0255 05/30/22 0310 05/31/22 0435 06/01/22 0119  NA 143 135 131* 130* 137 137  K 3.2* 4.3 4.1 3.6 4.0 3.8  CL 107 103 100 97* 100 105  CO2 25 23 22 25 26 26   GLUCOSE 100* 142* 118* 123* 153* 118*  BUN 7 8 5* 9 10 8   CREATININE 0.91 0.86 0.94 0.77 0.64 0.71  CALCIUM 9.5 9.7 9.2 8.8* 9.2 9.2  MG 2.0 1.5* 1.8  --  1.7 1.8   Liver Function Tests: Recent Labs  Lab 05/28/22 0231 05/29/22 0255 05/30/22 0310 05/31/22 0435 06/01/22 0119  AST 17 64* 64* 43* 31  ALT 18 54* 56* 41 30  ALKPHOS 75 72 67 77 77  BILITOT 0.6 0.6 0.2* 0.3 0.2*  PROT 6.9 6.7 6.4* 6.3* 6.3*  ALBUMIN 3.8 3.5 3.1* 3.1* 3.1*   No results for input(s): "LIPASE", "AMYLASE" in the last 168 hours. No results for input(s): "AMMONIA" in the last 168 hours. CBC: Recent Labs  Lab 05/28/22 0231 05/29/22 0255 05/30/22 0310 05/31/22 0435 06/01/22 0119  WBC 9.4 3.1* 1.7* 2.6* 3.3*  NEUTROABS 7.9*  --  0.8* 0.7* 0.8*  HGB 12.6* 13.1 12.2* 11.6* 11.5*  HCT 37.6* 38.1* 35.9* 34.8* 34.4*  MCV 91.0 88.8 88.4 89.2 89.8  PLT 228 192 176 194 215   Cardiac Enzymes: No results for input(s): "CKTOTAL", "CKMB", "CKMBINDEX", "TROPONINI" in the last 168 hours. BNP: Invalid input(s): "POCBNP" CBG: No results for input(s): "GLUCAP" in the last 168 hours. D-Dimer No results for input(s): "DDIMER" in the last 72 hours. Hgb A1c No results for input(s): "HGBA1C" in the last 72 hours. Lipid Profile No results for input(s): "CHOL", "HDL",  "LDLCALC", "TRIG", "CHOLHDL", "LDLDIRECT" in the last 72 hours. Thyroid function studies No results for input(s): "TSH", "T4TOTAL", "T3FREE", "THYROIDAB" in the last 72 hours.  Invalid input(s): "FREET3" Anemia work up No results for input(s): "VITAMINB12", "FOLATE", "FERRITIN", "TIBC", "IRON", "RETICCTPCT" in the last 72 hours. Urinalysis    Component Value Date/Time   COLORURINE YELLOW 05/28/2022 0410   APPEARANCEUR CLEAR 05/28/2022 0410   LABSPEC 1.014 05/28/2022 0410   PHURINE 8.0 05/28/2022 0410   GLUCOSEU NEGATIVE 05/28/2022 0410   HGBUR NEGATIVE 05/28/2022 0410  BILIRUBINUR NEGATIVE 05/28/2022 0410   KETONESUR 5 (A) 05/28/2022 0410   PROTEINUR NEGATIVE 05/28/2022 0410   NITRITE NEGATIVE 05/28/2022 0410   LEUKOCYTESUR NEGATIVE 05/28/2022 0410   Sepsis Labs Recent Labs  Lab 05/29/22 0255 05/30/22 0310 05/31/22 0435 06/01/22 0119  WBC 3.1* 1.7* 2.6* 3.3*   Microbiology Recent Results (from the past 240 hour(s))  Culture, blood (x 2)     Status: Abnormal   Collection Time: 05/28/22  2:29 AM   Specimen: BLOOD  Result Value Ref Range Status   Specimen Description BLOOD SITE NOT SPECIFIED  Final   Special Requests   Final    BOTTLES DRAWN AEROBIC AND ANAEROBIC Blood Culture results may not be optimal due to an excessive volume of blood received in culture bottles   Culture  Setup Time   Final    IN BOTH AEROBIC AND ANAEROBIC BOTTLES GRAM POSITIVE COCCI IN CHAINS IN CLUSTERS Organism ID to follow CRITICAL RESULT CALLED TO, READ BACK BY AND VERIFIED WITH:  C/ PHARMD K. HURTH 05/28/22 1646 A. LAFRANCE Performed at [facility], [address] [place], [place] [address]    Culture STAPHYLOCOCCUS AUREUS (A)  Final   Report Status 05/30/2022 FINAL  Final   Organism ID, Bacteria STAPHYLOCOCCUS AUREUS  Final      Susceptibility   Staphylococcus aureus - MIC*    CIPROFLOXACIN <=0.5 SENSITIVE Sensitive     ERYTHROMYCIN <=0.25 SENSITIVE Sensitive     GENTAMICIN  <=0.5 SENSITIVE Sensitive     OXACILLIN 0.5 SENSITIVE Sensitive     TETRACYCLINE <=1 SENSITIVE Sensitive     VANCOMYCIN 1 SENSITIVE Sensitive     TRIMETH/SULFA <=10 SENSITIVE Sensitive     CLINDAMYCIN <=0.25 SENSITIVE Sensitive     RIFAMPIN <=0.5 SENSITIVE Sensitive     Inducible Clindamycin NEGATIVE Sensitive     * STAPHYLOCOCCUS AUREUS  Blood Culture ID Panel (Reflexed)     Status: Abnormal   Collection Time: 05/28/22  2:29 AM  Result Value Ref Range Status   Enterococcus faecalis NOT DETECTED NOT DETECTED Final   Enterococcus Faecium NOT DETECTED NOT DETECTED Final   Listeria monocytogenes NOT DETECTED NOT DETECTED Final   Staphylococcus species DETECTED (A) NOT DETECTED Final    Comment: CRITICAL RESULT CALLED TO, READ BACK BY AND VERIFIED WITH:  C/ PHARMD K. HURTH 05/28/22 1646 A. LAFRANCE    Staphylococcus aureus (BCID) DETECTED (A) NOT DETECTED Final    Comment: CRITICAL RESULT CALLED TO, READ BACK BY AND VERIFIED WITH:  C/ PHARMD K. HURTH 05/28/22 1646 A. LAFRANCE    Staphylococcus epidermidis NOT DETECTED NOT DETECTED Final   Staphylococcus lugdunensis NOT DETECTED NOT DETECTED Final   Streptococcus species NOT DETECTED NOT DETECTED Final   Streptococcus agalactiae NOT DETECTED NOT DETECTED Final   Streptococcus pneumoniae NOT DETECTED NOT DETECTED Final   Streptococcus pyogenes NOT DETECTED NOT DETECTED Final   A.calcoaceticus-baumannii NOT DETECTED NOT DETECTED Final   Bacteroides fragilis NOT DETECTED NOT DETECTED Final   Enterobacterales NOT DETECTED NOT DETECTED Final   Enterobacter cloacae complex NOT DETECTED NOT DETECTED Final   Escherichia coli NOT DETECTED NOT DETECTED Final   Klebsiella aerogenes NOT DETECTED NOT DETECTED Final   Klebsiella oxytoca NOT DETECTED NOT DETECTED Final   Klebsiella pneumoniae NOT DETECTED NOT DETECTED Final   Proteus species NOT DETECTED NOT DETECTED Final   Salmonella species NOT DETECTED NOT DETECTED Final   Serratia marcescens  NOT DETECTED NOT DETECTED Final   Haemophilus influenzae NOT DETECTED NOT DETECTED  Final   Neisseria meningitidis NOT DETECTED NOT DETECTED Final   Pseudomonas aeruginosa NOT DETECTED NOT DETECTED Final   Stenotrophomonas maltophilia NOT DETECTED NOT DETECTED Final   Candida albicans NOT DETECTED NOT DETECTED Final   Candida auris NOT DETECTED NOT DETECTED Final   Candida glabrata NOT DETECTED NOT DETECTED Final   Candida krusei NOT DETECTED NOT DETECTED Final   Candida parapsilosis NOT DETECTED NOT DETECTED Final   Candida tropicalis NOT DETECTED NOT DETECTED Final   Cryptococcus neoformans/gattii NOT DETECTED NOT DETECTED Final   Meth resistant mecA/C and MREJ NOT DETECTED NOT DETECTED Final    Comment: Performed at [facility], [address] [place], [place] [address]  Culture, blood (x 2)     Status: Abnormal   Collection Time: 05/28/22  2:31 AM   Specimen: BLOOD RIGHT ARM  Result Value Ref Range Status   Specimen Description BLOOD RIGHT ARM  Final   Special Requests   Final    BOTTLES DRAWN AEROBIC AND ANAEROBIC Blood Culture results may not be optimal due to an excessive volume of blood received in culture bottles   Culture  Setup Time   Final    IN BOTH AEROBIC AND ANAEROBIC BOTTLES GRAM POSITIVE COCCI IN CHAINS CRITICAL VALUE NOTED.  VALUE IS CONSISTENT WITH PREVIOUSLY REPORTED AND CALLED VALUE.    Culture (A)  Final    STAPHYLOCOCCUS AUREUS SUSCEPTIBILITIES PERFORMED ON PREVIOUS CULTURE WITHIN THE LAST 5 DAYS. Performed at [facility], [address], [place], [place] [address]    Report Status 05/30/2022 FINAL  Final  Culture, blood (Routine X 2) w Reflex to ID Panel     Status: None (Preliminary result)   Collection Time: 05/30/22 11:05 AM   Specimen: BLOOD  Result Value Ref Range Status   Specimen Description BLOOD LEFT ANTECUBITAL  Final   Special Requests   Final    BOTTLES DRAWN AEROBIC AND ANAEROBIC Blood Culture adequate volume   Culture   Final     NO GROWTH 3 DAYS Performed at [facility], [address] [place], [place] [address]    Report Status PENDING  Incomplete  Culture, blood (Routine X 2) w Reflex to ID Panel     Status: None (Preliminary result)   Collection Time: 05/30/22 11:05 AM   Specimen: BLOOD LEFT HAND  Result Value Ref Range Status   Specimen Description BLOOD LEFT HAND  Final   Special Requests   Final    BOTTLES DRAWN AEROBIC AND ANAEROBIC Blood Culture adequate volume   Culture   Final    NO GROWTH 3 DAYS Performed at [facility], [address], [place], [place] [address]    Report Status PENDING  Incomplete     Time coordinating discharge: Over 30 minutes  SIGNED:   Azucena Fallen, DO [facility] 06/02/2022, 11:37 AM Pager   If 7PM-7AM, please contact night-coverage [email or web address]

## 2022-06-02 NOTE — Progress Notes (Signed)
ID Update -   Patient with hospital onset MSSA bacteremia 2/2 superficial thrombophlebitis from PIV (confirmed with VAS).   Cleared cultures quickly and improved clinically quickly. 1/2 sets (reflecting low burden).  TTE negative   Will finish out with 1x dose oritavancin and have him start linezolid x 2 weeks on January 4th.   D/W Dr. Natale Milch. & ID pharmacy.  Appt made with Dr. Elinor Parkinson for follow up on 06/22/2022 and put on discharge summary.     Rexene Alberts, MSN, NP-C [facility]  [email or web address] Pager: [phone] Office: [phone] [facility] Main Line: [phone] *Secure Chat Communication Welcome

## 2022-06-02 NOTE — TOC Transition Note (Signed)
Transition of Care [facility]) - CM/SW Discharge Note   Patient Details  Name: Eric Conley MRN: [id number] Date of Birth: 1960-11-27  [facility] CM/SW Contact:  Kermit Balo, RN Phone Number: 06/02/2022, 12:42 PM   Clinical Narrative:    Pt is discharging home with home health services through [place]. Information on the AVS. CM spoke to pts sister in law yesterday and provider her choice.  No DME needs.  Medications for home to be delivered to the room per [facility] pharmacy. Per SIL yesterday family will transport home.    Final next level of care: Home w Home Health Services Barriers to Discharge: No Barriers Identified   Patient Goals and CMS Choice CMS Medicare.gov Compare Post Acute Care list provided to:: Patient Represenative (must comment) Choice offered to / list presented to :  (SIL)  Discharge Placement                         Discharge Plan and Services Additional resources added to the After Visit Summary for     Discharge Planning Services: CM Consult Post Acute Care Choice: Home Health                    HH Arranged: PT, OT [facility] Agency: [facility] Date [facility] Agency Contacted: 06/02/22   Representative spoke with at [facility] Agency: cory  Social Determinants of Health (SDOH) Interventions SDOH Screenings   Food Insecurity: No Food Insecurity (05/31/2022)  Housing: Low Risk  (05/31/2022)  Transportation Needs: No Transportation Needs (05/31/2022)  Utilities: Not At Risk (05/31/2022)  Tobacco Use: High Risk (05/31/2022)     Readmission Risk Interventions     No data to display

## 2022-06-02 NOTE — Progress Notes (Signed)
Physical Therapy Treatment Patient Details Name: Eric Conley MRN: [id number] DOB: 10-08-1960 Today's Date: 06/02/2022   History of Present Illness Patient is a 61 y/o male who presents on 12/20 with seizure activity accompanied by UI. Also went into alcohol withdrawal and developed fever of unclear etiology. PMH includes depression, HTN, alcohol dependence, SDH in August 2023.    PT Comments    Pt greeted supine and agreeable to session with encouragement with focus on gait without AD for improved balance/postural reactions and improved activity tolerance. Pt grossly min guard for all mobility for safety as pt with some impulsivity and decreased insight into deficits and decreased safety awareness. Pt demonstrating improvement with ambulation without AD in hall with no overt LOB and no instances of scissor stepping, however pt scoring 17/32 on DG indicating pt continues to be fall risk and continues to benefit from skilled PT services to progress toward functional mobility goals. Pt pt declining further balance exercises. Current plan remains appropriate to address deficits and maximize functional independence and safety, will continue to follow acutely.    Recommendations for follow up therapy are one component of a multi-disciplinary discharge planning process, led by the attending physician.  Recommendations may be updated based on patient status, additional functional criteria and insurance authorization.  Follow Up Recommendations  Home health PT     Assistance Recommended at Discharge Intermittent Supervision/Assistance  Patient can return home with the following A little help with walking and/or transfers;A little help with bathing/dressing/bathroom;Help with stairs or ramp for entrance;Assist for transportation;Assistance with cooking/housework   Equipment Recommendations  None recommended by PT    Recommendations for Other Services       Precautions / Restrictions  Precautions Precautions: Fall Restrictions Weight Bearing Restrictions: No     Mobility  Bed Mobility Overal bed mobility: Needs Assistance Bed Mobility: Supine to Sit, Sit to Supine     Supine to sit: Supervision, HOB elevated     General bed mobility comments: No assist needed, increased time.    Transfers Overall transfer level: Needs assistance Equipment used: None Transfers: Sit to/from Stand Sit to Stand: Min guard           General transfer comment: Min guard A for safety    Ambulation/Gait Ambulation/Gait assistance: Min guard Gait Distance (Feet): 400 Feet Assistive device: None Gait Pattern/deviations: Step-through pattern, Decreased stride length Gait velocity: WFL     General Gait Details: slightly unsteady gait improved from previous session without AD, no overt LOB,   Stairs             Wheelchair Mobility    Modified Rankin (Stroke Patients Only)       Balance Overall balance assessment: Needs assistance Sitting-balance support: Feet supported, No upper extremity supported Sitting balance-Leahy Scale: Fair     Standing balance support: During functional activity, Single extremity supported Standing balance-Leahy Scale: Fair Standing balance comment: min guard A- close supervision for distance within room                 Standardized Balance Assessment Standardized Balance Assessment : Dynamic Gait Index   Dynamic Gait Index Level Surface: Mild Impairment Change in Gait Speed: Mild Impairment Gait with Horizontal Head Turns: Moderate Impairment Gait with Vertical Head Turns: Mild Impairment Gait and Pivot Turn: Mild Impairment Step Over Obstacle: Normal Step Around Obstacles: Normal Steps: Mild Impairment Total Score: 17      Cognition Arousal/Alertness: Awake/alert Behavior During Therapy: Flat affect Overall Cognitive Status: Impaired/Different from  baseline Area of Impairment: Memory, Safety/judgement,  Awareness, Problem solving                     Memory: Decreased short-term memory   Safety/Judgement: Decreased awareness of deficits, Decreased awareness of safety Awareness: Emergent Problem Solving: Slow processing, Requires verbal cues General Comments: Pt continues with poor insight into deficits        Exercises      General Comments General comments (skin integrity, edema, etc.): pt needing encouragement to participate      Pertinent Vitals/Pain Pain Assessment Pain Assessment: No/denies pain    Home Living                          Prior Function            PT Goals (current goals can now be found in the care plan section) Acute Rehab PT Goals PT Goal Formulation: With patient Time For Goal Achievement: 06/12/22    Frequency    Min 3X/week      PT Plan      Co-evaluation              AM-PAC PT "6 Clicks" Mobility   Outcome Measure  Help needed turning from your back to your side while in a flat bed without using bedrails?: A Little Help needed moving from lying on your back to sitting on the side of a flat bed without using bedrails?: A Little Help needed moving to and from a bed to a chair (including a wheelchair)?: A Little Help needed standing up from a chair using your arms (e.g., wheelchair or bedside chair)?: A Little Help needed to walk in hospital room?: A Little Help needed climbing 3-5 steps with a railing? : A Little 6 Click Score: 18    End of Session Equipment Utilized During Treatment: Gait belt Activity Tolerance: Patient tolerated treatment well Patient left: with call bell/phone within reach;in bed (seated EOB) Nurse Communication: Mobility status PT Visit Diagnosis: Muscle weakness (generalized) (M62.81);Difficulty in walking, not elsewhere classified (R26.2);Unsteadiness on feet (R26.81)     Time: 1119-1140 PT Time Calculation (min) (ACUTE ONLY): 21 min  Charges:  $Therapeutic Activity: 8-22  mins                    Telia Amundson R. PTA Acute Rehabilitation Services Office: [phone]   Catalina Antigua 06/02/2022, 12:39 PM

## 2022-06-02 NOTE — Telephone Encounter (Signed)
Patient Advocate Encounter   Received notification that prior authorization for Lacosamide 100MG  tablets is required.   PA submitted on 06/02/2022 Key 06/04/2022 Status is pending       [email or web address], CPhT Pharmacy Patient Advocate Specialist [facility] Patient Advocate Team Direct Number: [phone]  Fax: [phone]

## 2022-06-04 LAB — CULTURE, BLOOD (ROUTINE X 2)
Culture: NO GROWTH
Culture: NO GROWTH
Special Requests: ADEQUATE
Special Requests: ADEQUATE

## 2022-06-07 ENCOUNTER — Telehealth: Payer: Self-pay | Admitting: Neurology

## 2022-06-07 ENCOUNTER — Other Ambulatory Visit: Payer: 59 | Admitting: *Deleted

## 2022-06-07 NOTE — Telephone Encounter (Signed)
Pt's sister-in-law, Wilberto Console.  Had a seizure on 12/20 at home, called EMS  Just discharged from [facility] on 12/28 from having a seizure. While in hospital had a 24 hour EEG,and had 6 seizure episodes while in the hospital.. Want to know if he still needs to reschedule EEG appt with [facility]. Would like a call from the nurse.

## 2022-06-07 NOTE — Telephone Encounter (Signed)
Called sister in law [place] and we were able to move his may appt to April and added appt to wait list and also encouraged her to call as well once in a while to see if something else sooner pops up. Sister in law was very grateful and stated that she will keep her eye on his mychart for sooner appt notifications and will call as well to check for sooner appts.

## 2022-06-22 ENCOUNTER — Inpatient Hospital Stay: Payer: 59 | Admitting: Infectious Diseases

## 2022-06-27 ENCOUNTER — Ambulatory Visit (INDEPENDENT_AMBULATORY_CARE_PROVIDER_SITE_OTHER): Payer: 59 | Admitting: Neurology

## 2022-06-27 ENCOUNTER — Encounter: Payer: Self-pay | Admitting: Neurology

## 2022-06-27 VITALS — BP 134/88 | HR 97 | Ht 69.0 in | Wt 145.5 lb

## 2022-06-27 DIAGNOSIS — Z5181 Encounter for therapeutic drug level monitoring: Secondary | ICD-10-CM | POA: Diagnosis not present

## 2022-06-27 DIAGNOSIS — G40909 Epilepsy, unspecified, not intractable, without status epilepticus: Secondary | ICD-10-CM

## 2022-06-27 DIAGNOSIS — S065XAA Traumatic subdural hemorrhage with loss of consciousness status unknown, initial encounter: Secondary | ICD-10-CM | POA: Diagnosis not present

## 2022-06-27 MED ORDER — LEVETIRACETAM 750 MG PO TABS
1500.0000 mg | ORAL_TABLET | Freq: Two times a day (BID) | ORAL | 4 refills | Status: DC
  Filled 2022-08-31: qty 120, 30d supply, fill #0
  Filled 2022-09-27: qty 120, 30d supply, fill #1

## 2022-06-27 MED ORDER — FOLIC ACID 1 MG PO TABS
1.0000 mg | ORAL_TABLET | Freq: Every day | ORAL | 3 refills | Status: DC
  Filled 2022-08-31: qty 30, 30d supply, fill #0
  Filled 2022-10-21 – 2022-11-11 (×2): qty 30, 30d supply, fill #1

## 2022-06-27 MED ORDER — LACOSAMIDE 100 MG PO TABS: 100.0000 mg | ORAL_TABLET | Freq: Two times a day (BID) | ORAL | 4 refills | Status: DC

## 2022-06-27 NOTE — Patient Instructions (Addendum)
Continue with Keppra 1500 mg twice daily Continue with lacosamide 100 mg twice daily, will check levels today including a vitamin D.   Discussed driving restriction for next 6 months.   Follow-up in 3 months or sooner if worse.

## 2022-06-27 NOTE — Progress Notes (Signed)
[facility]  PATIENT: Eric Conley DOB: [birth year]  REQUESTING CLINICIAN: Assunta Found, MD HISTORY FROM: Patient and sister in law  REASON FOR VISIT: Seizure    HISTORICAL  CHIEF COMPLAINT:  Chief Complaint  Patient presents with   Follow-up    Rm 13. Relapse in seizures on 12.20.23 and was hospitalized for 8 days at [facility]. Patient complains of altered hearing, taste, and smell since the head injury in the fall and states it has not changed since and is looking for something or tests that may look into this further.    INTERVAL HISTORY 06/27/2022:  Eric Conley presents today for follow-up, he is accompanied by sister.  Last visit was in November, at that time, plan was to continue him on Keppra 500 mg twice daily.  He reported being compliant with the medication but unfortunately on December 20, he did have a breakthrough seizure.  He reported being compliant with the medication and also family member has reported that he has been taking the medication as directed.  He did have 2 generalized seizures and in the hospital while on EEG had 6 nonconvulsive seizures.  His Keppra was increased to 1500 mg twice daily and he was lacosamide 200 mg twice daily was added.  Since discharge from the hospital, he has not had any additional seizures.  He is tolerating the medication well, denies any side effect from the medication.  He also reported he has stopped drinking since his initial hospitalization and subdural hematoma in October.   HISTORY OF PRESENT ILLNESS:  This is a 62 year old gentleman past medical history of alcohol use disorder, hypertension, fall in August with subdural hematoma who is presenting after a seizure in October.  Patient reports at that time he was complaining of diarrhea for 3 days and increased numbness in bilateral hands and and feet and the next thing that he know he is in the hospital. He reports poor memory of his recent hospitalization but sister-in-law  told me that patient was witnessed to have a seizure by EMS, generalized tonic-clonic seizures.  Per chart review he required Versed administration.  He was seen by neurology in the hospital.  Due to his history of hematoma in August, he was recommended to start Keppra 500 mg twice daily.  He started the medication, reports compliance and denies any side effect.  He reported he has cut down his alcohol intake, and is having meeting for anger management.  Denies any previous history of seizures  Handedness: Right handed  Onset: Oct 15  Seizure Type: Generalized convulsion   Current frequency: Only once  Any injuries from seizures: Denies  Seizure risk factors: History of alcohol abuse, subdural hematoma  Previous ASMs: None  Currenty ASMs: Levetiracetam 1500 mg twice daily. Lacosamide 100 mg BID  ASMs side effects: Denies  Brain Images: Subdural hematoma  Previous EEGs: Not available for review   OTHER MEDICAL CONDITIONS: Alcohol abuse, hypertension, subdural hematoma  REVIEW OF SYSTEMS: Full 14 system review of systems performed and negative with exception of: As noted in the HPI  ALLERGIES: Allergies  Allergen Reactions   Codeine    Poison Sumac Extract    Vancomycin     Possible redman rxn - order at 1/2 infusion rate     HOME MEDICATIONS: Outpatient Medications Prior to Visit  Medication Sig Dispense Refill   magnesium oxide (MAG-OX) 400 (240 Mg) MG tablet Take 1 tablet (400 mg total) by mouth 2 (two) times daily. 60 tablet 1  metoprolol tartrate (LOPRESSOR) 25 MG tablet Take 1 tablet (25 mg total) by mouth 2 (two) times daily. 60 tablet 0   omeprazole (PRILOSEC) 20 MG capsule Take 1 capsule (20 mg total) by mouth daily. Alternates with nexium 30 capsule 1   thiamine (VITAMIN B-1) 100 MG tablet Take 1 tablet (100 mg total) by mouth daily. 30 tablet 2   folic acid (FOLVITE) 1 MG tablet Take 1 tablet (1 mg total) by mouth daily. 30 tablet 2   Lacosamide 100 MG TABS  Take 1 tablet (100 mg total) by mouth 2 (two) times daily. 60 tablet 0   levETIRAcetam (KEPPRA) 750 MG tablet Take 2 tablets (1,500 mg total) by mouth 2 (two) times daily. 120 tablet 1   No facility-administered medications prior to visit.    PAST MEDICAL HISTORY: Past Medical History:  Diagnosis Date   Acid reflux    Depression    Hypertension    Seizure ([place])    Subdural hematoma (H[facility])    Substance abuse ([place])     PAST SURGICAL HISTORY: Past Surgical History:  Procedure Laterality Date   NO PAST SURGERIES      FAMILY HISTORY: History reviewed. No pertinent family history.  SOCIAL HISTORY: Social History   Socioeconomic History   Marital status: Legally Separated    Spouse name: Not on file   Number of children: Not on file   Years of education: Not on file   Highest education level: Not on file  Occupational History   Not on file  Tobacco Use   Smoking status: Every Day    Packs/day: 0.25    Types: Cigarettes   Smokeless tobacco: Never  Vaping Use   Vaping Use: Never used  Substance and Sexual Activity   Alcohol use: Yes    Alcohol/week: 7.0 standard drinks of alcohol    Types: 7 Cans of beer per week    Comment: 1 beer daily, last drink evening of 12/28/21, on occation   Drug use: Not Currently   Sexual activity: Not Currently  Other Topics Concern   Not on file  Social History Narrative   Right handed   Caffeine use: caffeine pill as needed, energy drinks (1 per day)   Social Determinants of Health   Financial Resource Strain: Not on file  Food Insecurity: No Food Insecurity (05/31/2022)   Hunger Vital Sign    Worried About Running Out of Food in the Last Year: Never true    Ran Out of Food in the Last Year: Never true  Transportation Needs: No Transportation Needs (05/31/2022)   PRAPARE - Hydrologist (Medical): No    Lack of Transportation (Non-Medical): No  Physical Activity: Not on file  Stress: Not on file   Social Connections: Not on file  Intimate Partner Violence: Not At Risk (05/31/2022)   Humiliation, Afraid, Rape, and Kick questionnaire    Fear of Current or Ex-Partner: No    Emotionally Abused: No    Physically Abused: No    Sexually Abused: No    PHYSICAL EXAM  GENERAL EXAM/CONSTITUTIONAL: Vitals:  Vitals:   06/27/22 1000  BP: 134/88  Pulse: 97  Weight: 145 lb 8 oz (66 kg)  Height: 5\' 9"  (1.753 m)   Body mass index is 21.49 kg/m. Wt Readings from Last 3 Encounters:  06/27/22 145 lb 8 oz (66 kg)  06/02/22 138 lb 0.1 oz (62.6 kg)  04/22/22 140 lb (63.5 kg)   Patient is in  no distress; well developed, nourished and groomed; neck is supple  EYES: Visual fields full to confrontation, Extraocular movements intacts,  No results found.  MUSCULOSKELETAL: Gait, strength, tone, movements noted in Neurologic exam below  NEUROLOGIC: MENTAL STATUS:      No data to display         awake, alert, oriented to person, place and time Having difficulty recalling his last hospitalization Reports 6 quarter in $1.75. Able to spell EMPTY forward but not backward  language fluent, comprehension intact, naming intact fund of knowledge appropriate  CRANIAL NERVE:  2nd, 3rd, 4th, 6th - Visual fields full to confrontation, extraocular muscles intact, no nystagmus 5th - facial sensation symmetric 7th - facial strength symmetric 8th - hearing intact 9th - palate elevates symmetrically, uvula midline 11th - shoulder shrug symmetric 12th - tongue protrusion midline  MOTOR:  normal bulk and tone, full strength in the BUE, BLE  SENSORY:  normal and symmetric to light touch  COORDINATION:  finger-nose-finger, fine finger movements normal. Mild tremors noted with extension of both arms   GAIT/STATION:  normal     DIAGNOSTIC DATA (LABS, IMAGING, TESTING) - I reviewed patient records, labs, notes, testing and imaging myself where available.  Lab Results  Component Value  Date   WBC 3.3 (L) 06/01/2022   HGB 11.5 (L) 06/01/2022   HCT 34.4 (L) 06/01/2022   MCV 89.8 06/01/2022   PLT 215 06/01/2022      Component Value Date/Time   NA 137 06/01/2022 0119   K 3.8 06/01/2022 0119   CL 105 06/01/2022 0119   CO2 26 06/01/2022 0119   GLUCOSE 118 (H) 06/01/2022 0119   BUN 8 06/01/2022 0119   CREATININE 0.71 06/01/2022 0119   CALCIUM 9.2 06/01/2022 0119   PROT 6.3 (L) 06/01/2022 0119   ALBUMIN 3.1 (L) 06/01/2022 0119   AST 31 06/01/2022 0119   ALT 30 06/01/2022 0119   ALKPHOS 77 06/01/2022 0119   BILITOT 0.2 (L) 06/01/2022 0119   GFRNONAA >60 06/01/2022 0119   GFRAA >60 12/06/2019 1819   No results found for: "CHOL", "HDL", "LDLCALC", "LDLDIRECT", "TRIG" No results found for: "HGBA1C" Lab Results  Component Value Date   VITAMINB12 652 03/20/2022   Lab Results  Component Value Date   TSH 0.818 03/21/2022    MRI Brain 03/21/22 1. No acute intracranial process. No acute or subacute infarct. No seizure etiology identified. 2. 5 mm residual left parietal subdural hematoma, as seen on the recent CT. 3. Encephalomalacia in the left anteromedial temporal lobe and left inferior frontal lobe, likely the sequela of prior trauma.   CT Head 05/25/22 1. No acute intracranial hemorrhage. The previously described subdural hematoma is no longer seen. 2. Mild atrophy with chronic microvascular ischemic changes. 3. Stable encephalomalacia in the frontal and temporal lobes on the left. 4. Multilevel degenerative changes in the cervical spine without evidence of acute fracture. 5. Emphysema    EEG 05/26/2022 This study showed six seizures without clinical signs with generalized onset on 05/27/2022 at 0502, 0753, 0919, 1036, 1315 and 1547 lasting about 30 minutes each. As medications were adjusted, EEG improved and was suggestive of moderate diffuse encephalopathy.    I personally reviewed brain Images  ASSESSMENT AND PLAN  62 y.o. year old male  with  history of hypertension, alcohol use, subdural hematoma and seizure disorder who is here for follow up.  Back in December he did have a breakthrough seizure which required him to be hospitalized.  He was  also noted multiple nonconvulsive seizures.  Currently he is on Keppra 1500 mg twice daily and Vimpat 100 mg twice daily.  I will check the level, and continue patient on those medication.  I will see him in 3 months for follow-up.  Advised patient and parent to contact me if he has any breakthrough seizure.  They voiced understanding   1. Seizure disorder ([place])   2. Therapeutic drug monitoring   3. SDH (subdural hematoma) (H[facility])      Patient Instructions  Continue with Keppra 1500 mg twice daily Continue with lacosamide 100 mg twice daily, will check levels today including a vitamin D.   Discussed driving restriction for next 6 months.   Follow-up in 3 months or sooner if worse.   Per [facility] statutes, patients with seizures are not allowed to drive until they have been seizure-free for six months.  Other recommendations include using caution when using heavy equipment or power tools. Avoid working on ladders or at heights. Take showers instead of baths.  Do not swim alone.  Ensure the water temperature is not too high on the home water heater. Do not go swimming alone. Do not lock yourself in a room alone (i.e. bathroom). When caring for infants or small children, sit down when holding, feeding, or changing them to minimize risk of injury to the child in the event you have a seizure. Maintain good sleep hygiene. Avoid alcohol.  Also recommend adequate sleep, hydration, good diet and minimize stress.   During the Seizure  - First, ensure adequate ventilation and place patients on the floor on their left side  Loosen clothing around the neck and ensure the airway is patent. If the patient is clenching the teeth, do not force the mouth open with any object as this can cause severe  damage - Remove all items from the surrounding that can be hazardous. The patient may be oblivious to what's happening and may not even know what he or she is doing. If the patient is confused and wandering, either gently guide him/her away and block access to outside areas - Reassure the individual and be comforting - Call 911. In most cases, the seizure ends before EMS arrives. However, there are cases when seizures may last over 3 to 5 minutes. Or the individual may have developed breathing difficulties or severe injuries. If a pregnant patient or a person with diabetes develops a seizure, it is prudent to call an ambulance. - Finally, if the patient does not regain full consciousness, then call EMS. Most patients will remain confused for about 45 to 90 minutes after a seizure, so you must use judgment in calling for help. - Avoid restraints but make sure the patient is in a bed with padded side rails - Place the individual in a lateral position with the neck slightly flexed; this will help the saliva drain from the mouth and prevent the tongue from falling backward - Remove all nearby furniture and other hazards from the area - Provide verbal assurance as the individual is regaining consciousness - Provide the patient with privacy if possible - Call for help and start treatment as ordered by the caregiver   After the Seizure (Postictal Stage)  After a seizure, most patients experience confusion, fatigue, muscle pain and/or a headache. Thus, one should permit the individual to sleep. For the next few days, reassurance is essential. Being calm and helping reorient the person is also of importance.  Most seizures are painless and  end spontaneously. Seizures are not harmful to others but can lead to complications such as stress on the lungs, brain and the heart. Individuals with prior lung problems may develop labored breathing and respiratory distress.     Orders Placed This Encounter   Procedures   Levetiracetam level   Lacosamide   Vitamin D, 25-hydroxy    Meds ordered this encounter  Medications   Lacosamide 100 MG TABS    Sig: Take 1 tablet (100 mg total) by mouth 2 (two) times daily.    Dispense:  180 tablet    Refill:  4   levETIRAcetam (KEPPRA) 750 MG tablet    Sig: Take 2 tablets (1,500 mg total) by mouth 2 (two) times daily.    Dispense:  360 tablet    Refill:  4   folic acid (FOLVITE) 1 MG tablet    Sig: Take 1 tablet (1 mg total) by mouth daily.    Dispense:  90 tablet    Refill:  3    Return in about 3 months (around 09/26/2022).    Windell Norfolk, MD 06/27/2022, 10:44 AM  [facility] [address], [address] [place], [place] [address] [phone]

## 2022-07-01 NOTE — Telephone Encounter (Signed)
I returned a call from [facility] answering service from patient's [facility] pharmacist stating that they needed a new prescription for his Vimpat.  I called and spoke to the pharmacist and gave a verbal prescription authorization for Vimpat 100 mg twice daily.

## 2022-07-02 LAB — LACOSAMIDE: Lacosamide: 5.8 ug/mL (ref 5.0–10.0)

## 2022-07-02 LAB — LEVETIRACETAM LEVEL: Levetiracetam Lvl: 30.9 ug/mL (ref 10.0–40.0)

## 2022-07-02 LAB — VITAMIN D 25 HYDROXY (VIT D DEFICIENCY, FRACTURES): Vit D, 25-Hydroxy: 29.4 ng/mL — ABNORMAL LOW (ref 30.0–100.0)

## 2022-07-11 DIAGNOSIS — Z0279 Encounter for issue of other medical certificate: Secondary | ICD-10-CM

## 2022-07-14 ENCOUNTER — Telehealth: Payer: Self-pay | Admitting: Neurology

## 2022-07-14 NOTE — Telephone Encounter (Signed)
[employer] forms completed and ready for pickup. Called and spoke with Benjamin Stain and she will come in on Monday to pickup

## 2022-08-04 ENCOUNTER — Encounter: Payer: Self-pay | Admitting: Radiology

## 2022-08-08 ENCOUNTER — Telehealth: Payer: Self-pay | Admitting: Neurology

## 2022-08-08 NOTE — Telephone Encounter (Signed)
Pt states he has to do community service and needs a letter stating his limitations re: what he can and can not do. Pt best call back is [phone]

## 2022-08-11 ENCOUNTER — Encounter: Payer: Self-pay | Admitting: Neurology

## 2022-08-11 NOTE — Telephone Encounter (Signed)
Letter completed.

## 2022-08-12 ENCOUNTER — Other Ambulatory Visit (HOSPITAL_COMMUNITY): Payer: Self-pay

## 2022-08-22 NOTE — Telephone Encounter (Signed)
Pt called stating that Eric Conley informed him that they have not received the information they are needing from [place] for them to go ahead and finish the process for the pt. He sates that the fax number that it needs to be sent to is [phone] Please advise.

## 2022-08-25 NOTE — Telephone Encounter (Signed)
We can complete a short term disability if that is what he is referring to.

## 2022-08-25 NOTE — Telephone Encounter (Signed)
Do you want to complete short term disability paperwork for this patient?

## 2022-08-30 ENCOUNTER — Telehealth: Payer: Self-pay | Admitting: *Deleted

## 2022-08-30 ENCOUNTER — Other Ambulatory Visit (HOSPITAL_COMMUNITY): Payer: Self-pay

## 2022-08-30 NOTE — Telephone Encounter (Signed)
Pt [employer] form faxed on 08/30/2022 [phone]

## 2022-08-31 ENCOUNTER — Telehealth: Payer: Self-pay | Admitting: Neurology

## 2022-08-31 ENCOUNTER — Other Ambulatory Visit (HOSPITAL_COMMUNITY): Payer: Self-pay

## 2022-08-31 ENCOUNTER — Other Ambulatory Visit: Payer: Self-pay

## 2022-08-31 MED ORDER — METOPROLOL TARTRATE 25 MG PO TABS
25.0000 mg | ORAL_TABLET | Freq: Two times a day (BID) | ORAL | 3 refills | Status: AC
  Filled 2022-08-31: qty 60, 30d supply, fill #0
  Filled 2022-11-11: qty 60, 30d supply, fill #1
  Filled 2022-12-13: qty 60, 30d supply, fill #2
  Filled 2023-01-11: qty 60, 30d supply, fill #3

## 2022-08-31 MED ORDER — LACOSAMIDE 100 MG PO TABS: 100.0000 mg | ORAL_TABLET | Freq: Two times a day (BID) | ORAL | 2 refills | Status: DC

## 2022-08-31 NOTE — Telephone Encounter (Signed)
Called and spoke to patient and sister, stating that they are having trouble getting lacosamide filled before in of April because they did not have enough in stock at last fill, I advised them to reach out to pharmacy and explain the situation and let us know if she has any trouble.

## 2022-08-31 NOTE — Telephone Encounter (Signed)
Pt sister in law called. Stated she needs to talk to nurse about medication  Lacosamide 100 MG TABS. Stated pt has refill at the pharmacy but they can't pick them up til the end of April, She is requesting a call from nurse to discuss.

## 2022-08-31 NOTE — Progress Notes (Signed)
Done. Thanks.

## 2022-08-31 NOTE — Telephone Encounter (Signed)
Confirmed with Medical records this has been completed

## 2022-09-01 ENCOUNTER — Other Ambulatory Visit (HOSPITAL_COMMUNITY): Payer: Self-pay

## 2022-09-01 ENCOUNTER — Other Ambulatory Visit: Payer: Self-pay | Admitting: Neurology

## 2022-09-01 MED ORDER — LACOSAMIDE 100 MG PO TABS
100.0000 mg | ORAL_TABLET | Freq: Two times a day (BID) | ORAL | 2 refills | Status: DC
  Filled 2022-09-01: qty 60, 30d supply, fill #0
  Filled 2022-09-29: qty 60, 30d supply, fill #1

## 2022-09-01 NOTE — Telephone Encounter (Signed)
Called pt sister, Informed her of message nurse Katie sent, Please update patient that we have sent in a new prescription to try to get the gap filled, but may need to contact pharmacy to double check. Stated she would check with the pharmacy and give [place] a call back if there are anymore troubles.

## 2022-09-05 ENCOUNTER — Ambulatory Visit: Payer: 59 | Admitting: Neurology

## 2022-09-27 ENCOUNTER — Other Ambulatory Visit (HOSPITAL_COMMUNITY): Payer: Self-pay

## 2022-09-27 ENCOUNTER — Other Ambulatory Visit: Payer: Self-pay

## 2022-09-29 ENCOUNTER — Other Ambulatory Visit (HOSPITAL_COMMUNITY): Payer: Self-pay

## 2022-10-03 ENCOUNTER — Other Ambulatory Visit (HOSPITAL_COMMUNITY): Payer: Self-pay

## 2022-10-03 ENCOUNTER — Encounter: Payer: Self-pay | Admitting: Neurology

## 2022-10-03 ENCOUNTER — Ambulatory Visit (INDEPENDENT_AMBULATORY_CARE_PROVIDER_SITE_OTHER): Payer: 59 | Admitting: Neurology

## 2022-10-03 VITALS — BP 134/84 | HR 72 | Ht 69.0 in | Wt 143.5 lb

## 2022-10-03 DIAGNOSIS — S065XAA Traumatic subdural hemorrhage with loss of consciousness status unknown, initial encounter: Secondary | ICD-10-CM

## 2022-10-03 DIAGNOSIS — G40909 Epilepsy, unspecified, not intractable, without status epilepticus: Secondary | ICD-10-CM

## 2022-10-03 MED ORDER — LEVETIRACETAM 750 MG PO TABS
1500.0000 mg | ORAL_TABLET | Freq: Two times a day (BID) | ORAL | 4 refills | Status: DC
  Filled 2022-10-03 – 2022-10-27 (×2): qty 360, 90d supply, fill #0

## 2022-10-03 MED ORDER — LACOSAMIDE 100 MG PO TABS
100.0000 mg | ORAL_TABLET | Freq: Two times a day (BID) | ORAL | 3 refills | Status: DC
  Filled 2022-10-03: qty 180, 90d supply, fill #0
  Filled 2022-10-27: qty 60, 30d supply, fill #0
  Filled 2022-11-30: qty 60, 30d supply, fill #1
  Filled 2022-12-29: qty 60, 30d supply, fill #2

## 2022-10-03 NOTE — Progress Notes (Signed)
[facility]  PATIENT: Eric Conley DOB: 02/16/1961  REQUESTING CLINICIAN: Assunta Found, MD HISTORY FROM: Patient and sister in law  REASON FOR VISIT: Seizure    HISTORICAL  CHIEF COMPLAINT:  Chief Complaint  Patient presents with   Follow-up    Rm 12, brother present Fayrene Fearing) Sz: last one was December 2023, no problems/concerns   INTERVAL HISTORY 10/03/2022:  Eric Conley presents today for follow-up, he is accompanied by brother Fayrene Fearing.  His last visit was in January.  Since then, he has been doing well, no seizure or seizure-like activity.  He is compliant with the Vimpat 100 mg twice daily and Keppra 1500 mg twice daily..  His previous ASM levels were within normal limits.  His Keppra was 30.9 and his Vimpat was 5.8.  Currently he does not have any concerns or complaints.  He still not driving and not working, used to work as a Lobbyist.    INTERVAL HISTORY 06/27/2022:  Eric Conley presents today for follow-up, he is accompanied by sister.  Last visit was in November, at that time, plan was to continue him on Keppra 500 mg twice daily.  He reported being compliant with the medication but unfortunately on December 20, he did have a breakthrough seizure.  He reported being compliant with the medication and also family member has reported that he has been taking the medication as directed.  He did have 2 generalized seizures and in the hospital while on EEG had 6 nonconvulsive seizures.  His Keppra was increased to 1500 mg twice daily and he was lacosamide 200 mg twice daily was added.  Since discharge from the hospital, he has not had any additional seizures.  He is tolerating the medication well, denies any side effect from the medication.  He also reported he has stopped drinking since his initial hospitalization and subdural hematoma in October.   HISTORY OF PRESENT ILLNESS:  This is a 62 year old gentleman past medical history of alcohol use disorder, hypertension, fall  in August with subdural hematoma who is presenting after a seizure in October.  Patient reports at that time he was complaining of diarrhea for 3 days and increased numbness in bilateral hands and and feet and the next thing that he know he is in the hospital. He reports poor memory of his recent hospitalization but sister-in-law told me that patient was witnessed to have a seizure by EMS, generalized tonic-clonic seizures.  Per chart review he required Versed administration.  He was seen by neurology in the hospital.  Due to his history of hematoma in August, he was recommended to start Keppra 500 mg twice daily.  He started the medication, reports compliance and denies any side effect.  He reported he has cut down his alcohol intake, and is having meeting for anger management.  Denies any previous history of seizures  Handedness: Right handed  Onset: Oct 15  Seizure Type: Generalized convulsion   Current frequency: Only once  Any injuries from seizures: Denies  Seizure risk factors: History of alcohol abuse, subdural hematoma  Previous ASMs: None  Currenty ASMs: Levetiracetam 1500 mg twice daily. Lacosamide 100 mg BID  ASMs side effects: Denies  Brain Images: Subdural hematoma  Previous EEGs: Not available for review   OTHER MEDICAL CONDITIONS: Alcohol abuse, hypertension, subdural hematoma  REVIEW OF SYSTEMS: Full 14 system review of systems performed and negative with exception of: As noted in the HPI  ALLERGIES: Allergies  Allergen Reactions   Codeine  Poison Sumac Extract    Vancomycin     Possible redman rxn - order at 1/2 infusion rate     HOME MEDICATIONS: Outpatient Medications Prior to Visit  Medication Sig Dispense Refill   folic acid (FOLVITE) 1 MG tablet Take 1 tablet (1 mg total) by mouth daily. 90 tablet 3   Lacosamide 100 MG TABS Take 1 tablet (100 mg) by mouth 2 times daily. 180 tablet 2   levETIRAcetam (KEPPRA) 750 MG tablet Take 2 tablets (1,500 mg  total) by mouth 2 (two) times daily. 360 tablet 4   magnesium oxide (MAG-OX) 400 (240 Mg) MG tablet Take 1 tablet (400 mg total) by mouth 2 (two) times daily. 60 tablet 1   metoprolol tartrate (LOPRESSOR) 25 MG tablet Take 1 tablet (25 mg total) by mouth 2 (two) times daily. 180 tablet 3   omeprazole (PRILOSEC) 20 MG capsule Take 1 capsule (20 mg total) by mouth daily. Alternates with nexium 30 capsule 1   thiamine (VITAMIN B-1) 100 MG tablet Take 1 tablet (100 mg total) by mouth daily. 30 tablet 2   No facility-administered medications prior to visit.    PAST MEDICAL HISTORY: Past Medical History:  Diagnosis Date   Acid reflux    Depression    Hypertension    Seizure (HCC)    Subdural hematoma (HCC)    Substance abuse (HCC)     PAST SURGICAL HISTORY: Past Surgical History:  Procedure Laterality Date   NO PAST SURGERIES      FAMILY HISTORY: History reviewed. No pertinent family history.  SOCIAL HISTORY: Social History   Socioeconomic History   Marital status: Legally Separated    Spouse name: Not on file   Number of children: Not on file   Years of education: Not on file   Highest education level: Not on file  Occupational History   Not on file  Tobacco Use   Smoking status: Every Day    Packs/day: .25    Types: Cigarettes   Smokeless tobacco: Never  Vaping Use   Vaping Use: Never used  Substance and Sexual Activity   Alcohol use: Yes    Alcohol/week: 7.0 standard drinks of alcohol    Types: 7 Cans of beer per week    Comment: 1 beer daily, last drink evening of 12/28/21, on occation   Drug use: Not Currently   Sexual activity: Not Currently  Other Topics Concern   Not on file  Social History Narrative   Right handed   Caffeine use: caffeine pill as needed, energy drinks (1 per day)   Social Determinants of Health   Financial Resource Strain: Not on file  Food Insecurity: No Food Insecurity (05/31/2022)   Hunger Vital Sign    Worried About Running Out  of Food in the Last Year: Never true    Ran Out of Food in the Last Year: Never true  Transportation Needs: No Transportation Needs (05/31/2022)   PRAPARE - Administrator, Civil Service (Medical): No    Lack of Transportation (Non-Medical): No  Physical Activity: Not on file  Stress: Not on file  Social Connections: Not on file  Intimate Partner Violence: Not At Risk (05/31/2022)   Humiliation, Afraid, Rape, and Kick questionnaire    Fear of Current or Ex-Partner: No    Emotionally Abused: No    Physically Abused: No    Sexually Abused: No    PHYSICAL EXAM  GENERAL EXAM/CONSTITUTIONAL: Vitals:  Vitals:   10/03/22 0957  BP: 134/84  Pulse: 72  Weight: 143 lb 8 oz (65.1 kg)  Height: 5\' 9"  (1.753 m)   Body mass index is 21.19 kg/m. Wt Readings from Last 3 Encounters:  10/03/22 143 lb 8 oz (65.1 kg)  06/27/22 145 lb 8 oz (66 kg)  06/02/22 138 lb 0.1 oz (62.6 kg)   Patient is in no distress; well developed, nourished and groomed; neck is supple  MUSCULOSKELETAL: Gait, strength, tone, movements noted in Neurologic exam below  NEUROLOGIC: MENTAL STATUS:      No data to display         awake, alert, oriented to person, place and time language fluent, comprehension intact, naming intact fund of knowledge appropriate  CRANIAL NERVE:  2nd, 3rd, 4th, 6th - Visual fields full to confrontation, extraocular muscles intact, no nystagmus 5th - facial sensation symmetric 7th - facial strength symmetric 8th - hearing intact 9th - palate elevates symmetrically, uvula midline 11th - shoulder shrug symmetric 12th - tongue protrusion midline  MOTOR:  normal bulk and tone, full strength in the BUE, BLE  SENSORY:  normal and symmetric to light touch  COORDINATION:  finger-nose-finger, fine finger movements normal. Mild tremors noted with extension of both arms   GAIT/STATION:  normal   DIAGNOSTIC DATA (LABS, IMAGING, TESTING) - I reviewed patient records,  labs, notes, testing and imaging myself where available.  Lab Results  Component Value Date   WBC 3.3 (L) 06/01/2022   HGB 11.5 (L) 06/01/2022   HCT 34.4 (L) 06/01/2022   MCV 89.8 06/01/2022   PLT 215 06/01/2022      Component Value Date/Time   NA 137 06/01/2022 0119   K 3.8 06/01/2022 0119   CL 105 06/01/2022 0119   CO2 26 06/01/2022 0119   GLUCOSE 118 (H) 06/01/2022 0119   BUN 8 06/01/2022 0119   CREATININE 0.71 06/01/2022 0119   CALCIUM 9.2 06/01/2022 0119   PROT 6.3 (L) 06/01/2022 0119   ALBUMIN 3.1 (L) 06/01/2022 0119   AST 31 06/01/2022 0119   ALT 30 06/01/2022 0119   ALKPHOS 77 06/01/2022 0119   BILITOT 0.2 (L) 06/01/2022 0119   GFRNONAA >60 06/01/2022 0119   GFRAA >60 12/06/2019 1819   No results found for: "CHOL", "HDL", "LDLCALC", "LDLDIRECT", "TRIG" No results found for: "HGBA1C" Lab Results  Component Value Date   VITAMINB12 652 03/20/2022   Lab Results  Component Value Date   TSH 0.818 03/21/2022    MRI Brain 03/21/22 1. No acute intracranial process. No acute or subacute infarct. No seizure etiology identified. 2. 5 mm residual left parietal subdural hematoma, as seen on the recent CT. 3. Encephalomalacia in the left anteromedial temporal lobe and left inferior frontal lobe, likely the sequela of prior trauma.   CT Head 05/25/22 1. No acute intracranial hemorrhage. The previously described subdural hematoma is no longer seen. 2. Mild atrophy with chronic microvascular ischemic changes. 3. Stable encephalomalacia in the frontal and temporal lobes on the left. 4. Multilevel degenerative changes in the cervical spine without evidence of acute fracture. 5. Emphysema    EEG 05/26/2022 This study showed six seizures without clinical signs with generalized onset on 05/27/2022 at 0502, 0753, 0919, 1036, 1315 and 1547 lasting about 30 minutes each. As medications were adjusted, EEG improved and was suggestive of moderate diffuse encephalopathy.    I  personally reviewed brain Images  ASSESSMENT AND PLAN  62 y.o. year old male  with history of hypertension, alcohol use, subdural hematoma and seizure  disorder who is here for follow up.  Last visit was in January, since then he has been doing well.  He is compliant with his Keppra and Vimpat.  Plan will be for patient to continue with Keppra 1500 mg twice daily and Vimpat 100 mg twice daily.  Will also obtain a routine EEG for background assessment.  I will see him in 6 months for follow-up, if remains seizure-free, will likely discontinue the Vimpat and keep him on Keppra.  He voices understanding.  Again discussed driving restriction for total of 6 months.  He will be cleared to drive again on July 1.   1. Seizure disorder (HCC)   2. SDH (subdural hematoma) (HCC)     Patient Instructions  Routine EEG  Continue with Keppra 1500 mg twice daily  Continue with Vimpat 100 mg twice daily  Follow up in 6 months or sooner if worse    Per [facility] statutes, patients with seizures are not allowed to drive until they have been seizure-free for six months.  Other recommendations include using caution when using heavy equipment or power tools. Avoid working on ladders or at heights. Take showers instead of baths.  Do not swim alone.  Ensure the water temperature is not too high on the home water heater. Do not go swimming alone. Do not lock yourself in a room alone (i.e. bathroom). When caring for infants or small children, sit down when holding, feeding, or changing them to minimize risk of injury to the child in the event you have a seizure. Maintain good sleep hygiene. Avoid alcohol.  Also recommend adequate sleep, hydration, good diet and minimize stress.   During the Seizure  - First, ensure adequate ventilation and place patients on the floor on their left side  Loosen clothing around the neck and ensure the airway is patent. If the patient is clenching the teeth, do not force the mouth  open with any object as this can cause severe damage - Remove all items from the surrounding that can be hazardous. The patient may be oblivious to what's happening and may not even know what he or she is doing. If the patient is confused and wandering, either gently guide him/her away and block access to outside areas - Reassure the individual and be comforting - Call 911. In most cases, the seizure ends before EMS arrives. However, there are cases when seizures may last over 3 to 5 minutes. Or the individual may have developed breathing difficulties or severe injuries. If a pregnant patient or a person with diabetes develops a seizure, it is prudent to call an ambulance. - Finally, if the patient does not regain full consciousness, then call EMS. Most patients will remain confused for about 45 to 90 minutes after a seizure, so you must use judgment in calling for help. - Avoid restraints but make sure the patient is in a bed with padded side rails - Place the individual in a lateral position with the neck slightly flexed; this will help the saliva drain from the mouth and prevent the tongue from falling backward - Remove all nearby furniture and other hazards from the area - Provide verbal assurance as the individual is regaining consciousness - Provide the patient with privacy if possible - Call for help and start treatment as ordered by the caregiver   After the Seizure (Postictal Stage)  After a seizure, most patients experience confusion, fatigue, muscle pain and/or a headache. Thus, one should permit the individual  to sleep. For the next few days, reassurance is essential. Being calm and helping reorient the person is also of importance.  Most seizures are painless and end spontaneously. Seizures are not harmful to others but can lead to complications such as stress on the lungs, brain and the heart. Individuals with prior lung problems may develop labored breathing and respiratory distress.      Orders Placed This Encounter  Procedures   EEG adult    No orders of the defined types were placed in this encounter.   Return in about 6 months (around 04/04/2023).    Windell Norfolk, MD 10/03/2022, 10:29 AM  [facility] [address], [address] [place], [place] [address] [phone]

## 2022-10-03 NOTE — Patient Instructions (Signed)
Routine EEG  Continue with Keppra 1500 mg twice daily  Continue with Vimpat 100 mg twice daily  Follow up in 6 months or sooner if worse

## 2022-10-17 ENCOUNTER — Other Ambulatory Visit: Payer: 59 | Admitting: *Deleted

## 2022-10-18 ENCOUNTER — Ambulatory Visit (INDEPENDENT_AMBULATORY_CARE_PROVIDER_SITE_OTHER): Payer: No Typology Code available for payment source | Admitting: Neurology

## 2022-10-18 DIAGNOSIS — G40909 Epilepsy, unspecified, not intractable, without status epilepticus: Secondary | ICD-10-CM | POA: Diagnosis not present

## 2022-10-18 DIAGNOSIS — S065XAA Traumatic subdural hemorrhage with loss of consciousness status unknown, initial encounter: Secondary | ICD-10-CM

## 2022-10-18 NOTE — Procedures (Signed)
    History:  62 year old man with seizure disorder   EEG classification: Awake and drowsy  Description of the recording: The background rhythms of this recording consists of a fairly well modulated medium amplitude alpha rhythm of 12 Hz that is reactive to eye opening and closure. Present in the anterior head region is a 15-20 Hz beta activity. Photic stimulation was performed, did not show any abnormalities. Hyperventilation was also performed, did not show any abnormalities. Drowsiness was manifested by background fragmentation. No abnormal epileptiform discharges seen during this recording. There was no focal slowing. There were no electrographic seizure identified.   Abnormality: None   Impression: This is a normal EEG recorded while drowsy and awake. No evidence of interictal epileptiform discharges. Normal EEGs, however, do not rule out epilepsy.    Windell Norfolk, MD [facility]

## 2022-10-21 ENCOUNTER — Other Ambulatory Visit (HOSPITAL_COMMUNITY): Payer: Self-pay

## 2022-10-24 ENCOUNTER — Ambulatory Visit: Payer: 59 | Admitting: Neurology

## 2022-10-27 ENCOUNTER — Other Ambulatory Visit (HOSPITAL_COMMUNITY): Payer: Self-pay

## 2022-10-28 ENCOUNTER — Other Ambulatory Visit (HOSPITAL_COMMUNITY): Payer: Self-pay

## 2022-10-29 ENCOUNTER — Other Ambulatory Visit (HOSPITAL_COMMUNITY): Payer: Self-pay

## 2022-11-01 ENCOUNTER — Other Ambulatory Visit (HOSPITAL_COMMUNITY): Payer: Self-pay

## 2022-11-02 ENCOUNTER — Telehealth: Payer: Self-pay | Admitting: Neurology

## 2022-11-02 NOTE — Telephone Encounter (Signed)
Pt would like a call to know if Dr Teresa Coombs feels pt is ok to do community service, please call pt to discuss.

## 2022-11-03 ENCOUNTER — Encounter: Payer: Self-pay | Admitting: Neurology

## 2022-11-03 NOTE — Telephone Encounter (Signed)
Pt has called to f/u on his call from yesterday morning.  Pt states this is a time sensitive matter and would like a call before the weekend

## 2022-11-03 NOTE — Telephone Encounter (Signed)
Called and spoke to pt and stated that we put the letter for community service on the [place] for them and he voiced understanding

## 2022-11-03 NOTE — Telephone Encounter (Signed)
Done. Thanks.

## 2022-11-03 NOTE — Telephone Encounter (Signed)
Due to his uncontrolled seizure, I will recommend against community service. 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

## 2022-11-11 ENCOUNTER — Other Ambulatory Visit (HOSPITAL_COMMUNITY): Payer: Self-pay

## 2022-11-16 ENCOUNTER — Other Ambulatory Visit (HOSPITAL_COMMUNITY): Payer: Self-pay

## 2022-11-30 ENCOUNTER — Other Ambulatory Visit (HOSPITAL_COMMUNITY): Payer: Self-pay

## 2022-12-07 ENCOUNTER — Telehealth: Payer: Self-pay | Admitting: Neurology

## 2022-12-07 NOTE — Telephone Encounter (Signed)
Pt called wanting to know if he can be cleared to drive so that he can go look for a job. Pt states he has been seizure clear since Dec. 20th. Please advise.

## 2022-12-13 ENCOUNTER — Other Ambulatory Visit (HOSPITAL_COMMUNITY): Payer: Self-pay

## 2022-12-14 ENCOUNTER — Encounter: Payer: Self-pay | Admitting: Neurology

## 2022-12-14 NOTE — Telephone Encounter (Signed)
Pt called stating that the [facility] is requiring a letter from provider stating that he is good to drive. Please advise.

## 2022-12-14 NOTE — Telephone Encounter (Signed)
Call to patient, advised Dr. Teresa Coombs wrote letter and patient is in agreement for [place] to mail to his home. No other questions at this time.

## 2022-12-29 ENCOUNTER — Other Ambulatory Visit (HOSPITAL_COMMUNITY): Payer: Self-pay

## 2023-01-11 ENCOUNTER — Other Ambulatory Visit (HOSPITAL_COMMUNITY): Payer: Self-pay

## 2023-01-16 ENCOUNTER — Other Ambulatory Visit (HOSPITAL_COMMUNITY): Payer: Self-pay

## 2023-01-25 ENCOUNTER — Other Ambulatory Visit (HOSPITAL_COMMUNITY): Payer: Self-pay

## 2023-01-31 ENCOUNTER — Other Ambulatory Visit (HOSPITAL_COMMUNITY): Payer: Self-pay

## 2023-01-31 ENCOUNTER — Other Ambulatory Visit: Payer: Self-pay | Admitting: Neurology

## 2023-01-31 MED ORDER — LACOSAMIDE 100 MG PO TABS: 100.0000 mg | ORAL_TABLET | Freq: Two times a day (BID) | ORAL | 3 refills | Status: DC

## 2023-01-31 NOTE — Telephone Encounter (Signed)
Requested Prescriptions   Pending Prescriptions Disp Refills   Lacosamide 100 MG TABS 180 tablet 3    Sig: Take 1 tablet (100 mg) by mouth 2 times daily.   Last seen 10/18/22, next appt scheduled 04/10/23  Dispenses   Dispensed Days Supply Quantity Provider Pharmacy  Lacosamide 100 MG TABS 01/04/2023 30 60 tablet Windell Norfolk, MD [facility]  Lacosamide 100 MG TABS 12/02/2022 30 60 tablet Windell Norfolk, MD [place] [facility]  Lacosamide 100 MG TABS 11/03/2022 30 60 tablet Windell Norfolk, MD [place] [facility]  Lacosamide 100 MG TABS 09/29/2022 30 60 tablet Windell Norfolk, MD [place] [facility]  Lacosamide 100 MG TABS 09/02/2022 30 60 tablet Windell Norfolk, MD [place] [facility]  LACOSAMIDE 100MG     TAB 07/01/2022 30 60 each Micki Riley, MD [facility] [phone] ...  Lacosamide 100 MG TABS 06/02/2022 30 60 tablet Azucena Fallen, MD [facility].Marland KitchenMarland Kitchen

## 2023-01-31 NOTE — Telephone Encounter (Signed)
Pt request refill and permanently changing pharmacy for Lacosamide 100 MG TABS be sent to  [facility] [address] [place], [place] [address] Phone: [phone] Fax: [phone]

## 2023-02-02 NOTE — Telephone Encounter (Addendum)
Pt request refill and permanently changing pharmacy for Lacosamide 100 MG TABS be sent to  [facility] [address] [place], [place] [address] Phone: [phone] Fax: [phone]  Please resend due to was sent to the wrong pharmacy

## 2023-04-10 ENCOUNTER — Ambulatory Visit: Payer: No Typology Code available for payment source | Admitting: Neurology

## 2023-04-10 ENCOUNTER — Encounter: Payer: Self-pay | Admitting: Neurology

## 2023-04-10 VITALS — BP 124/82 | Resp 16 | Ht 69.0 in | Wt 139.0 lb

## 2023-04-10 DIAGNOSIS — S065XAA Traumatic subdural hemorrhage with loss of consciousness status unknown, initial encounter: Secondary | ICD-10-CM | POA: Diagnosis not present

## 2023-04-10 DIAGNOSIS — G40909 Epilepsy, unspecified, not intractable, without status epilepticus: Secondary | ICD-10-CM | POA: Diagnosis not present

## 2023-04-10 MED ORDER — LEVETIRACETAM 750 MG PO TABS: 1500.0000 mg | ORAL_TABLET | Freq: Two times a day (BID) | ORAL | 4 refills | Status: AC

## 2023-04-10 MED ORDER — LEVETIRACETAM 750 MG PO TABS: 1500.0000 mg | ORAL_TABLET | Freq: Two times a day (BID) | ORAL | 4 refills | Status: DC

## 2023-04-10 NOTE — Patient Instructions (Signed)
Continue with Keppra 1500 mg twice daily Decrease Vimpat to 50 mg twice daily then stop the medication Continue to follow with PCP Letter of explanation given to patient Follow-up in 1 year or sooner if worse.

## 2023-04-10 NOTE — Progress Notes (Signed)
[facility]  PATIENT: Eric Conley DOB: 02/14/1961  REQUESTING CLINICIAN: Assunta Found, MD HISTORY FROM: Patient and sister in law  REASON FOR VISIT: Seizure    HISTORICAL  CHIEF COMPLAINT:  Chief Complaint  Patient presents with   Seizures    Rm13, alone, Sz: last one christmas week 2023. Pt here to hopefully get removed off some sz meds. He either wants to work or have disability forms filled out saying he cannot work. He lost smell, taste, and some hearing since last sz   INTERVAL HISTORY 04/10/2023:  Eric Conley presents today for follow-up, last visit was in April, since then he has been doing well.  He denies any seizure or seizure like activity.  He is compliant with his Keppra 1500 mg twice daily and Vimpat 100 mg twice daily.  He reports last month finding old Keppra bottle 500 mg twice daily and he has been taking it, with the decrease of the Keppra dose, he has not had any seizure or seizure like activity.  Reports currently he is undergoing a lot of stress, he does live in a motel and worry about affording his medications and paying her bills.   INTERVAL HISTORY 10/03/2022:  Eric Conley presents today for follow-up, he is accompanied by brother Fayrene Fearing.  His last visit was in January.  Since then, he has been doing well, no seizure or seizure-like activity.  He is compliant with the Vimpat 100 mg twice daily and Keppra 1500 mg twice daily..  His previous ASM levels were within normal limits.  His Keppra was 30.9 and his Vimpat was 5.8.  Currently he does not have any concerns or complaints.  He still not driving and not working, used to work as a Lobbyist.    INTERVAL HISTORY 06/27/2022:  Eric Conley presents today for follow-up, he is accompanied by sister.  Last visit was in November, at that time, plan was to continue him on Keppra 500 mg twice daily.  He reported being compliant with the medication but unfortunately on December 20, he did have a breakthrough seizure.   He reported being compliant with the medication and also family member has reported that he has been taking the medication as directed.  He did have 2 generalized seizures and in the hospital while on EEG had 6 nonconvulsive seizures.  His Keppra was increased to 1500 mg twice daily and he was lacosamide 200 mg twice daily was added.  Since discharge from the hospital, he has not had any additional seizures.  He is tolerating the medication well, denies any side effect from the medication.  He also reported he has stopped drinking since his initial hospitalization and subdural hematoma in October.   HISTORY OF PRESENT ILLNESS:  This is a 62 year old gentleman past medical history of alcohol use disorder, hypertension, fall in August with subdural hematoma who is presenting after a seizure in October.  Patient reports at that time he was complaining of diarrhea for 3 days and increased numbness in bilateral hands and and feet and the next thing that he know he is in the hospital. He reports poor memory of his recent hospitalization but sister-in-law told me that patient was witnessed to have a seizure by EMS, generalized tonic-clonic seizures.  Per chart review he required Versed administration.  He was seen by neurology in the hospital.  Due to his history of hematoma in August, he was recommended to start Keppra 500 mg twice daily.  He started the medication, reports  compliance and denies any side effect.  He reported he has cut down his alcohol intake, and is having meeting for anger management.  Denies any previous history of seizures  Handedness: Right handed  Onset: Oct 15  Seizure Type: Generalized convulsion   Current frequency: Only once  Any injuries from seizures: Denies  Seizure risk factors: History of alcohol abuse, subdural hematoma  Previous ASMs: None  Currenty ASMs: Levetiracetam 1500 mg twice daily. Lacosamide 100 mg BID  ASMs side effects: Denies  Brain Images: Subdural  hematoma  Previous EEGs: Not available for review   OTHER MEDICAL CONDITIONS: Alcohol abuse, hypertension, subdural hematoma  REVIEW OF SYSTEMS: Full 14 system review of systems performed and negative with exception of: As noted in the HPI  ALLERGIES: Allergies  Allergen Reactions   Codeine    Poison Sumac Extract    Vancomycin     Possible redman rxn - order at 1/2 infusion rate     HOME MEDICATIONS: Outpatient Medications Prior to Visit  Medication Sig Dispense Refill   metoprolol tartrate (LOPRESSOR) 25 MG tablet Take 1 tablet (25 mg total) by mouth 2 (two) times daily. 180 tablet 3   omeprazole (PRILOSEC) 20 MG capsule Take 1 capsule (20 mg total) by mouth daily. Alternates with nexium 30 capsule 1   Lacosamide 100 MG TABS Take 1 tablet (100 mg) by mouth 2 times daily. 180 tablet 3   levETIRAcetam (KEPPRA) 750 MG tablet Take 2 tablets (1,500 mg total) by mouth 2 (two) times daily. 360 tablet 4   folic acid (FOLVITE) 1 MG tablet Take 1 tablet (1 mg total) by mouth daily. 90 tablet 3   magnesium oxide (MAG-OX) 400 (240 Mg) MG tablet Take 1 tablet (400 mg total) by mouth 2 (two) times daily. 60 tablet 1   thiamine (VITAMIN B-1) 100 MG tablet Take 1 tablet (100 mg total) by mouth daily. 30 tablet 2   No facility-administered medications prior to visit.    PAST MEDICAL HISTORY: Past Medical History:  Diagnosis Date   Acid reflux    Depression    Hypertension    Seizure (HCC)    Subdural hematoma (HCC)    Substance abuse (HCC)     PAST SURGICAL HISTORY: Past Surgical History:  Procedure Laterality Date   NO PAST SURGERIES      FAMILY HISTORY: History reviewed. No pertinent family history.  SOCIAL HISTORY: Social History   Socioeconomic History   Marital status: Legally Separated    Spouse name: Not on file   Number of children: Not on file   Years of education: Not on file   Highest education level: Not on file  Occupational History   Not on file  Tobacco  Use   Smoking status: Every Day    Current packs/day: 0.25    Types: Cigarettes   Smokeless tobacco: Never  Vaping Use   Vaping status: Never Used  Substance and Sexual Activity   Alcohol use: Yes    Alcohol/week: 7.0 standard drinks of alcohol    Types: 7 Cans of beer per week    Comment: 1 beer daily, last drink evening of 12/28/21, on occation   Drug use: Not Currently   Sexual activity: Not Currently  Other Topics Concern   Not on file  Social History Narrative   Right handed   Caffeine use: caffeine pill as needed, energy drinks (1 per day)   Social Determinants of Health   Financial Resource Strain: Not on file  Food Insecurity: No Food Insecurity (05/31/2022)   Hunger Vital Sign    Worried About Running Out of Food in the Last Year: Never true    Ran Out of Food in the Last Year: Never true  Transportation Needs: No Transportation Needs (05/31/2022)   PRAPARE - Administrator, Civil Service (Medical): No    Lack of Transportation (Non-Medical): No  Physical Activity: Not on file  Stress: Not on file  Social Connections: Not on file  Intimate Partner Violence: Not At Risk (05/31/2022)   Humiliation, Afraid, Rape, and Kick questionnaire    Fear of Current or Ex-Partner: No    Emotionally Abused: No    Physically Abused: No    Sexually Abused: No    PHYSICAL EXAM  GENERAL EXAM/CONSTITUTIONAL: Vitals:  Vitals:   04/10/23 1056  BP: 124/82  Resp: 16  Weight: 139 lb (63 kg)  Height: 5\' 9"  (1.753 m)   Body mass index is 20.53 kg/m. Wt Readings from Last 3 Encounters:  04/10/23 139 lb (63 kg)  10/03/22 143 lb 8 oz (65.1 kg)  06/27/22 145 lb 8 oz (66 kg)   Patient is in no distress; well developed, nourished and groomed; neck is supple  MUSCULOSKELETAL: Gait, strength, tone, movements noted in Neurologic exam below  NEUROLOGIC: MENTAL STATUS:      No data to display         awake, alert, oriented to person, place and time language  fluent, comprehension intact, naming intact fund of knowledge appropriate  CRANIAL NERVE:  2nd, 3rd, 4th, 6th - Visual fields full to confrontation, extraocular muscles intact, no nystagmus 5th - facial sensation symmetric 7th - facial strength symmetric 8th - hearing intact 9th - palate elevates symmetrically, uvula midline 11th - shoulder shrug symmetric 12th - tongue protrusion midline  MOTOR:  normal bulk and tone, full strength in the BUE, BLE  SENSORY:  normal and symmetric to light touch  COORDINATION:  finger-nose-finger, fine finger movements normal. Mild tremors noted with extension of both arms   GAIT/STATION:  normal   DIAGNOSTIC DATA (LABS, IMAGING, TESTING) - I reviewed patient records, labs, notes, testing and imaging myself where available.  Lab Results  Component Value Date   WBC 3.3 (L) 06/01/2022   HGB 11.5 (L) 06/01/2022   HCT 34.4 (L) 06/01/2022   MCV 89.8 06/01/2022   PLT 215 06/01/2022      Component Value Date/Time   NA 137 06/01/2022 0119   K 3.8 06/01/2022 0119   CL 105 06/01/2022 0119   CO2 26 06/01/2022 0119   GLUCOSE 118 (H) 06/01/2022 0119   BUN 8 06/01/2022 0119   CREATININE 0.71 06/01/2022 0119   CALCIUM 9.2 06/01/2022 0119   PROT 6.3 (L) 06/01/2022 0119   ALBUMIN 3.1 (L) 06/01/2022 0119   AST 31 06/01/2022 0119   ALT 30 06/01/2022 0119   ALKPHOS 77 06/01/2022 0119   BILITOT 0.2 (L) 06/01/2022 0119   GFRNONAA >60 06/01/2022 0119   GFRAA >60 12/06/2019 1819   No results found for: "CHOL", "HDL", "LDLCALC", "LDLDIRECT", "TRIG" No results found for: "HGBA1C" Lab Results  Component Value Date   VITAMINB12 652 03/20/2022   Lab Results  Component Value Date   TSH 0.818 03/21/2022    MRI Brain 03/21/22 1. No acute intracranial process. No acute or subacute infarct. No seizure etiology identified. 2. 5 mm residual left parietal subdural hematoma, as seen on the recent CT. 3. Encephalomalacia in the left anteromedial temporal  lobe and left inferior frontal lobe, likely the sequela of prior trauma.   CT Head 05/25/22 1. No acute intracranial hemorrhage. The previously described subdural hematoma is no longer seen. 2. Mild atrophy with chronic microvascular ischemic changes. 3. Stable encephalomalacia in the frontal and temporal lobes on the left. 4. Multilevel degenerative changes in the cervical spine without evidence of acute fracture. 5. Emphysema    EEG 05/26/2022 This study showed six seizures without clinical signs with generalized onset on 05/27/2022 at 0502, 0753, 0919, 1036, 1315 and 1547 lasting about 30 minutes each. As medications were adjusted, EEG improved and was suggestive of moderate diffuse encephalopathy.    Routine EEG 10/18/2022: Normal    I personally reviewed brain Images  ASSESSMENT AND PLAN  61 y.o. year old male  with history of hypertension, alcohol use, subdural hematoma and seizure disorder who is here for follow up.  Last visit was in April 2024, his EEG in May was normal.  He is currently on Keppra 500 mg twice daily when he is supposed to take 1500 mg twice daily and Vimpat 100 mg twice daily.  I have advised him to discontinue the old Keppra Bottle of 500 mg twice daily and take his current Keppra which is 1500 mg twice daily.  In terms of his Vimpat, we will decrease it to 50 mg twice daily then stop the medication.  He understands that he should remain on Keppra 1500 mg twice daily.  I will see him in 1 year for follow-up and he does also understand to contact me if he has any additional seizures or any other questions or concerns.   1. Seizure disorder (HCC)   2. SDH (subdural hematoma) (HCC)      Patient Instructions  Continue with Keppra 1500 mg twice daily Decrease Vimpat to 50 mg twice daily then stop the medication Continue to follow with PCP Letter of explanation given to patient Follow-up in 1 year or sooner if worse.   Per [facility] statutes,  patients with seizures are not allowed to drive until they have been seizure-free for six months.  Other recommendations include using caution when using heavy equipment or power tools. Avoid working on ladders or at heights. Take showers instead of baths.  Do not swim alone.  Ensure the water temperature is not too high on the home water heater. Do not go swimming alone. Do not lock yourself in a room alone (i.e. bathroom). When caring for infants or small children, sit down when holding, feeding, or changing them to minimize risk of injury to the child in the event you have a seizure. Maintain good sleep hygiene. Avoid alcohol.  Also recommend adequate sleep, hydration, good diet and minimize stress.   During the Seizure  - First, ensure adequate ventilation and place patients on the floor on their left side  Loosen clothing around the neck and ensure the airway is patent. If the patient is clenching the teeth, do not force the mouth open with any object as this can cause severe damage - Remove all items from the surrounding that can be hazardous. The patient may be oblivious to what's happening and may not even know what he or she is doing. If the patient is confused and wandering, either gently guide him/her away and block access to outside areas - Reassure the individual and be comforting - Call 911. In most cases, the seizure ends before EMS arrives. However, there are cases when seizures may last over  3 to 5 minutes. Or the individual may have developed breathing difficulties or severe injuries. If a pregnant patient or a person with diabetes develops a seizure, it is prudent to call an ambulance. - Finally, if the patient does not regain full consciousness, then call EMS. Most patients will remain confused for about 45 to 90 minutes after a seizure, so you must use judgment in calling for help. - Avoid restraints but make sure the patient is in a bed with padded side rails - Place the individual  in a lateral position with the neck slightly flexed; this will help the saliva drain from the mouth and prevent the tongue from falling backward - Remove all nearby furniture and other hazards from the area - Provide verbal assurance as the individual is regaining consciousness - Provide the patient with privacy if possible - Call for help and start treatment as ordered by the caregiver   After the Seizure (Postictal Stage)  After a seizure, most patients experience confusion, fatigue, muscle pain and/or a headache. Thus, one should permit the individual to sleep. For the next few days, reassurance is essential. Being calm and helping reorient the person is also of importance.  Most seizures are painless and end spontaneously. Seizures are not harmful to others but can lead to complications such as stress on the lungs, brain and the heart. Individuals with prior lung problems may develop labored breathing and respiratory distress.     No orders of the defined types were placed in this encounter.   Meds ordered this encounter  Medications   DISCONTD: levETIRAcetam (KEPPRA) 750 MG tablet    Sig: Take 2 tablets (1,500 mg total) by mouth 2 (two) times daily.    Dispense:  360 tablet    Refill:  4   levETIRAcetam (KEPPRA) 750 MG tablet    Sig: Take 2 tablets (1,500 mg total) by mouth 2 (two) times daily.    Dispense:  360 tablet    Refill:  4    Return in about 1 year (around 04/09/2024).    Windell Norfolk, MD 04/10/2023, 12:47 PM  [facility] [address], [address] [place], [place] [address] [phone]

## 2023-07-11 DIAGNOSIS — Z0271 Encounter for disability determination: Secondary | ICD-10-CM

## 2023-10-05 DEATH — deceased
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
## 2024-03-20 ENCOUNTER — Encounter (INDEPENDENT_AMBULATORY_CARE_PROVIDER_SITE_OTHER): Payer: Self-pay | Admitting: Gastroenterology
